# Patient Record
Sex: Female | Born: 1935 | Race: White | Hispanic: No | Marital: Married | Smoking: Former smoker
Health system: Southern US, Community
[De-identification: ages and names within clinical notes are randomized; demographics above are authoritative.]

## PROBLEM LIST (undated history)

## (undated) DIAGNOSIS — F411 Generalized anxiety disorder: Secondary | ICD-10-CM

## (undated) DIAGNOSIS — L538 Other specified erythematous conditions: Secondary | ICD-10-CM

## (undated) DIAGNOSIS — M1990 Unspecified osteoarthritis, unspecified site: Secondary | ICD-10-CM

## (undated) DIAGNOSIS — K5732 Diverticulitis of large intestine without perforation or abscess without bleeding: Secondary | ICD-10-CM

## (undated) DIAGNOSIS — J310 Chronic rhinitis: Secondary | ICD-10-CM

## (undated) DIAGNOSIS — Z8719 Personal history of other diseases of the digestive system: Secondary | ICD-10-CM

## (undated) DIAGNOSIS — E782 Mixed hyperlipidemia: Secondary | ICD-10-CM

## (undated) DIAGNOSIS — E669 Obesity, unspecified: Secondary | ICD-10-CM

## (undated) DIAGNOSIS — Z8489 Family history of other specified conditions: Secondary | ICD-10-CM

## (undated) DIAGNOSIS — L8991 Pressure ulcer of unspecified site, stage 1: Secondary | ICD-10-CM

## (undated) DIAGNOSIS — K219 Gastro-esophageal reflux disease without esophagitis: Secondary | ICD-10-CM

## (undated) DIAGNOSIS — L98499 Non-pressure chronic ulcer of skin of other sites with unspecified severity: Secondary | ICD-10-CM

## (undated) DIAGNOSIS — D126 Benign neoplasm of colon, unspecified: Secondary | ICD-10-CM

## (undated) DIAGNOSIS — F329 Major depressive disorder, single episode, unspecified: Secondary | ICD-10-CM

## (undated) DIAGNOSIS — G309 Alzheimer's disease, unspecified: Principal | ICD-10-CM

## (undated) DIAGNOSIS — F0390 Unspecified dementia without behavioral disturbance: Secondary | ICD-10-CM

## (undated) DIAGNOSIS — Z87440 Personal history of urinary (tract) infections: Secondary | ICD-10-CM

## (undated) DIAGNOSIS — J40 Bronchitis, not specified as acute or chronic: Secondary | ICD-10-CM

## (undated) DIAGNOSIS — F0280 Dementia in other diseases classified elsewhere without behavioral disturbance: Principal | ICD-10-CM

## (undated) DIAGNOSIS — F3289 Other specified depressive episodes: Secondary | ICD-10-CM

## (undated) DIAGNOSIS — I10 Essential (primary) hypertension: Secondary | ICD-10-CM

## (undated) HISTORY — DX: Unspecified osteoarthritis, unspecified site: M19.90

## (undated) HISTORY — DX: Unspecified dementia, unspecified severity, without behavioral disturbance, psychotic disturbance, mood disturbance, and anxiety: F03.90

## (undated) HISTORY — DX: Other specified erythematous conditions: L53.8

## (undated) HISTORY — DX: Diverticulitis of large intestine without perforation or abscess without bleeding: K57.32

## (undated) HISTORY — DX: Chronic rhinitis: J31.0

## (undated) HISTORY — PX: TOTAL HIP ARTHROPLASTY: SHX124

## (undated) HISTORY — DX: Obesity, unspecified: E66.9

## (undated) HISTORY — DX: Gastro-esophageal reflux disease without esophagitis: K21.9

## (undated) HISTORY — DX: Major depressive disorder, single episode, unspecified: F32.9

## (undated) HISTORY — DX: Mixed hyperlipidemia: E78.2

## (undated) HISTORY — DX: Other specified depressive episodes: F32.89

## (undated) HISTORY — DX: Generalized anxiety disorder: F41.1

## (undated) HISTORY — PX: ROTATOR CUFF REPAIR: SHX139

## (undated) HISTORY — DX: Benign neoplasm of colon, unspecified: D12.6

## (undated) HISTORY — PX: CHOLECYSTECTOMY: SHX55

## (undated) HISTORY — DX: Alzheimer's disease, unspecified: G30.9

## (undated) HISTORY — PX: VESICOVAGINAL FISTULA CLOSURE W/ TAH: SUR271

## (undated) HISTORY — DX: Essential (primary) hypertension: I10

## (undated) HISTORY — DX: Dementia in other diseases classified elsewhere without behavioral disturbance: F02.80

## (undated) HISTORY — DX: Pressure ulcer of unspecified site, stage 1: L89.91

---

## 1978-07-16 HISTORY — PX: ABDOMINAL HYSTERECTOMY: SHX81

## 1998-03-18 ENCOUNTER — Encounter: Payer: Self-pay | Admitting: Orthopedic Surgery

## 1998-03-18 ENCOUNTER — Ambulatory Visit (HOSPITAL_COMMUNITY): Admission: RE | Admit: 1998-03-18 | Discharge: 1998-03-18 | Payer: Self-pay | Admitting: Orthopedic Surgery

## 1998-05-19 ENCOUNTER — Ambulatory Visit (HOSPITAL_BASED_OUTPATIENT_CLINIC_OR_DEPARTMENT_OTHER): Admission: RE | Admit: 1998-05-19 | Discharge: 1998-05-19 | Payer: Self-pay | Admitting: Orthopedic Surgery

## 1998-05-30 ENCOUNTER — Encounter: Admission: RE | Admit: 1998-05-30 | Discharge: 1998-08-28 | Payer: Self-pay | Admitting: Orthopedic Surgery

## 1998-08-29 ENCOUNTER — Encounter: Admission: RE | Admit: 1998-08-29 | Discharge: 1998-11-27 | Payer: Self-pay

## 1998-12-07 ENCOUNTER — Encounter: Admission: RE | Admit: 1998-12-07 | Discharge: 1999-03-07 | Payer: Self-pay | Admitting: Internal Medicine

## 1998-12-28 ENCOUNTER — Encounter: Admission: RE | Admit: 1998-12-28 | Discharge: 1999-01-27 | Payer: Self-pay | Admitting: Orthopedic Surgery

## 1999-05-29 ENCOUNTER — Encounter: Payer: Self-pay | Admitting: Orthopedic Surgery

## 1999-05-29 ENCOUNTER — Ambulatory Visit (HOSPITAL_COMMUNITY): Admission: RE | Admit: 1999-05-29 | Discharge: 1999-05-29 | Payer: Self-pay | Admitting: Orthopedic Surgery

## 1999-06-12 ENCOUNTER — Ambulatory Visit (HOSPITAL_COMMUNITY): Admission: RE | Admit: 1999-06-12 | Discharge: 1999-06-12 | Payer: Self-pay | Admitting: Orthopedic Surgery

## 1999-06-12 ENCOUNTER — Encounter: Payer: Self-pay | Admitting: Orthopedic Surgery

## 1999-06-26 ENCOUNTER — Encounter: Payer: Self-pay | Admitting: Orthopedic Surgery

## 1999-06-26 ENCOUNTER — Ambulatory Visit (HOSPITAL_COMMUNITY): Admission: RE | Admit: 1999-06-26 | Discharge: 1999-06-26 | Payer: Self-pay | Admitting: Orthopedic Surgery

## 1999-08-31 ENCOUNTER — Encounter: Admission: RE | Admit: 1999-08-31 | Discharge: 1999-11-14 | Payer: Self-pay | Admitting: Neurosurgery

## 1999-10-12 ENCOUNTER — Ambulatory Visit (HOSPITAL_COMMUNITY): Admission: RE | Admit: 1999-10-12 | Discharge: 1999-10-12 | Payer: Self-pay | Admitting: Orthopedic Surgery

## 1999-10-12 ENCOUNTER — Encounter: Payer: Self-pay | Admitting: Orthopedic Surgery

## 1999-12-01 ENCOUNTER — Encounter: Payer: Self-pay | Admitting: Neurosurgery

## 1999-12-01 ENCOUNTER — Ambulatory Visit (HOSPITAL_COMMUNITY): Admission: RE | Admit: 1999-12-01 | Discharge: 1999-12-01 | Payer: Self-pay | Admitting: Neurosurgery

## 1999-12-15 ENCOUNTER — Encounter: Admission: RE | Admit: 1999-12-15 | Discharge: 2000-01-29 | Payer: Self-pay | Admitting: Anesthesiology

## 2000-05-15 ENCOUNTER — Encounter: Payer: Self-pay | Admitting: Orthopedic Surgery

## 2000-05-20 ENCOUNTER — Encounter: Payer: Self-pay | Admitting: Orthopedic Surgery

## 2000-05-20 ENCOUNTER — Inpatient Hospital Stay (HOSPITAL_COMMUNITY): Admission: RE | Admit: 2000-05-20 | Discharge: 2000-05-23 | Payer: Self-pay | Admitting: Orthopedic Surgery

## 2000-05-23 ENCOUNTER — Inpatient Hospital Stay (HOSPITAL_COMMUNITY)
Admission: RE | Admit: 2000-05-23 | Discharge: 2000-05-29 | Payer: Self-pay | Admitting: Physical Medicine & Rehabilitation

## 2000-08-22 ENCOUNTER — Encounter: Admission: RE | Admit: 2000-08-22 | Discharge: 2000-11-20 | Payer: Self-pay | Admitting: Family Medicine

## 2000-09-18 ENCOUNTER — Encounter: Admission: RE | Admit: 2000-09-18 | Discharge: 2000-09-18 | Payer: Self-pay | Admitting: Obstetrics and Gynecology

## 2000-09-18 ENCOUNTER — Encounter: Payer: Self-pay | Admitting: Obstetrics and Gynecology

## 2000-09-26 ENCOUNTER — Encounter: Admission: RE | Admit: 2000-09-26 | Discharge: 2000-12-25 | Payer: Self-pay | Admitting: Anesthesiology

## 2000-10-17 ENCOUNTER — Encounter: Admission: RE | Admit: 2000-10-17 | Discharge: 2000-10-17 | Payer: Self-pay | Admitting: Internal Medicine

## 2000-11-12 ENCOUNTER — Encounter (INDEPENDENT_AMBULATORY_CARE_PROVIDER_SITE_OTHER): Payer: Self-pay

## 2000-11-12 ENCOUNTER — Other Ambulatory Visit: Admission: RE | Admit: 2000-11-12 | Discharge: 2000-11-12 | Payer: Self-pay | Admitting: Gastroenterology

## 2000-11-21 ENCOUNTER — Encounter: Admission: RE | Admit: 2000-11-21 | Discharge: 2001-02-19 | Payer: Self-pay | Admitting: Family Medicine

## 2001-02-28 ENCOUNTER — Encounter: Admission: RE | Admit: 2001-02-28 | Discharge: 2001-05-29 | Payer: Self-pay | Admitting: Internal Medicine

## 2001-05-20 ENCOUNTER — Encounter: Payer: Self-pay | Admitting: Orthopedic Surgery

## 2001-05-22 ENCOUNTER — Inpatient Hospital Stay (HOSPITAL_COMMUNITY): Admission: RE | Admit: 2001-05-22 | Discharge: 2001-05-26 | Payer: Self-pay | Admitting: Orthopedic Surgery

## 2001-05-22 ENCOUNTER — Encounter: Payer: Self-pay | Admitting: Orthopedic Surgery

## 2001-06-26 ENCOUNTER — Encounter: Admission: RE | Admit: 2001-06-26 | Discharge: 2001-09-24 | Payer: Self-pay | Admitting: Orthopedic Surgery

## 2001-09-29 ENCOUNTER — Encounter: Admission: RE | Admit: 2001-09-29 | Discharge: 2001-10-24 | Payer: Self-pay | Admitting: Orthopedic Surgery

## 2002-02-11 ENCOUNTER — Encounter: Payer: Self-pay | Admitting: Obstetrics and Gynecology

## 2002-02-11 ENCOUNTER — Encounter: Admission: RE | Admit: 2002-02-11 | Discharge: 2002-02-11 | Payer: Self-pay | Admitting: Obstetrics and Gynecology

## 2002-08-21 ENCOUNTER — Ambulatory Visit (HOSPITAL_COMMUNITY): Admission: RE | Admit: 2002-08-21 | Discharge: 2002-08-21 | Payer: Self-pay | Admitting: Pulmonary Disease

## 2002-08-21 ENCOUNTER — Encounter: Payer: Self-pay | Admitting: Pulmonary Disease

## 2003-05-05 ENCOUNTER — Ambulatory Visit (HOSPITAL_COMMUNITY): Admission: RE | Admit: 2003-05-05 | Discharge: 2003-05-05 | Payer: Self-pay | Admitting: Gastroenterology

## 2003-05-05 ENCOUNTER — Encounter: Payer: Self-pay | Admitting: Gastroenterology

## 2003-10-09 ENCOUNTER — Emergency Department (HOSPITAL_COMMUNITY): Admission: EM | Admit: 2003-10-09 | Discharge: 2003-10-10 | Payer: Self-pay | Admitting: Emergency Medicine

## 2003-12-14 ENCOUNTER — Observation Stay (HOSPITAL_COMMUNITY): Admission: AD | Admit: 2003-12-14 | Discharge: 2003-12-17 | Payer: Self-pay | Admitting: Internal Medicine

## 2004-01-14 ENCOUNTER — Encounter: Payer: Self-pay | Admitting: Internal Medicine

## 2004-01-14 LAB — CONVERTED CEMR LAB

## 2004-02-01 ENCOUNTER — Ambulatory Visit (HOSPITAL_COMMUNITY): Admission: RE | Admit: 2004-02-01 | Discharge: 2004-02-01 | Payer: Self-pay | Admitting: Internal Medicine

## 2004-03-21 ENCOUNTER — Encounter: Payer: Self-pay | Admitting: Gastroenterology

## 2004-03-24 ENCOUNTER — Ambulatory Visit (HOSPITAL_COMMUNITY): Admission: RE | Admit: 2004-03-24 | Discharge: 2004-03-24 | Payer: Self-pay | Admitting: Gastroenterology

## 2004-06-05 ENCOUNTER — Ambulatory Visit: Payer: Self-pay | Admitting: Adult Health

## 2004-06-21 ENCOUNTER — Ambulatory Visit: Payer: Self-pay | Admitting: Internal Medicine

## 2004-06-30 ENCOUNTER — Ambulatory Visit: Payer: Self-pay | Admitting: Internal Medicine

## 2004-07-18 ENCOUNTER — Ambulatory Visit: Payer: Self-pay | Admitting: Internal Medicine

## 2004-08-01 ENCOUNTER — Ambulatory Visit: Payer: Self-pay | Admitting: Adult Health

## 2004-08-31 ENCOUNTER — Ambulatory Visit: Payer: Self-pay | Admitting: Internal Medicine

## 2004-10-21 ENCOUNTER — Emergency Department (HOSPITAL_COMMUNITY): Admission: EM | Admit: 2004-10-21 | Discharge: 2004-10-21 | Payer: Self-pay | Admitting: Emergency Medicine

## 2004-10-26 ENCOUNTER — Ambulatory Visit: Payer: Self-pay | Admitting: Pulmonary Disease

## 2004-11-09 ENCOUNTER — Ambulatory Visit: Payer: Self-pay | Admitting: Internal Medicine

## 2004-12-07 ENCOUNTER — Ambulatory Visit: Payer: Self-pay | Admitting: Internal Medicine

## 2005-01-01 ENCOUNTER — Ambulatory Visit: Payer: Self-pay | Admitting: Internal Medicine

## 2005-01-18 ENCOUNTER — Ambulatory Visit: Payer: Self-pay | Admitting: Internal Medicine

## 2005-03-15 ENCOUNTER — Ambulatory Visit: Payer: Self-pay | Admitting: Internal Medicine

## 2005-06-13 ENCOUNTER — Ambulatory Visit: Payer: Self-pay | Admitting: Internal Medicine

## 2005-08-13 ENCOUNTER — Ambulatory Visit: Payer: Self-pay | Admitting: Internal Medicine

## 2005-10-18 ENCOUNTER — Ambulatory Visit: Payer: Self-pay | Admitting: Pulmonary Disease

## 2005-11-19 ENCOUNTER — Ambulatory Visit: Payer: Self-pay | Admitting: Internal Medicine

## 2006-04-11 ENCOUNTER — Ambulatory Visit: Payer: Self-pay | Admitting: Internal Medicine

## 2006-04-12 ENCOUNTER — Ambulatory Visit: Payer: Self-pay | Admitting: Internal Medicine

## 2006-04-15 ENCOUNTER — Ambulatory Visit: Payer: Self-pay | Admitting: Pulmonary Disease

## 2006-05-31 ENCOUNTER — Ambulatory Visit: Payer: Self-pay | Admitting: Emergency Medicine

## 2006-07-15 ENCOUNTER — Emergency Department (HOSPITAL_COMMUNITY): Admission: EM | Admit: 2006-07-15 | Discharge: 2006-07-15 | Payer: Self-pay | Admitting: Emergency Medicine

## 2006-09-05 ENCOUNTER — Ambulatory Visit: Payer: Self-pay | Admitting: Internal Medicine

## 2007-03-12 ENCOUNTER — Ambulatory Visit: Payer: Self-pay | Admitting: Internal Medicine

## 2007-05-06 ENCOUNTER — Ambulatory Visit (HOSPITAL_BASED_OUTPATIENT_CLINIC_OR_DEPARTMENT_OTHER): Admission: RE | Admit: 2007-05-06 | Discharge: 2007-05-06 | Payer: Self-pay | Admitting: Urology

## 2007-05-14 ENCOUNTER — Ambulatory Visit: Payer: Self-pay | Admitting: Internal Medicine

## 2007-06-03 ENCOUNTER — Encounter: Payer: Self-pay | Admitting: Internal Medicine

## 2007-06-03 DIAGNOSIS — F329 Major depressive disorder, single episode, unspecified: Secondary | ICD-10-CM

## 2007-06-03 DIAGNOSIS — D126 Benign neoplasm of colon, unspecified: Secondary | ICD-10-CM

## 2007-06-03 DIAGNOSIS — Z8719 Personal history of other diseases of the digestive system: Secondary | ICD-10-CM

## 2007-06-03 DIAGNOSIS — J310 Chronic rhinitis: Secondary | ICD-10-CM

## 2007-06-03 DIAGNOSIS — E782 Mixed hyperlipidemia: Secondary | ICD-10-CM

## 2007-06-03 DIAGNOSIS — I10 Essential (primary) hypertension: Secondary | ICD-10-CM | POA: Insufficient documentation

## 2007-06-03 DIAGNOSIS — F068 Other specified mental disorders due to known physiological condition: Secondary | ICD-10-CM

## 2007-06-03 DIAGNOSIS — J209 Acute bronchitis, unspecified: Secondary | ICD-10-CM | POA: Insufficient documentation

## 2007-06-03 DIAGNOSIS — F411 Generalized anxiety disorder: Secondary | ICD-10-CM

## 2007-06-03 DIAGNOSIS — M1990 Unspecified osteoarthritis, unspecified site: Secondary | ICD-10-CM | POA: Insufficient documentation

## 2007-06-03 DIAGNOSIS — K219 Gastro-esophageal reflux disease without esophagitis: Secondary | ICD-10-CM | POA: Insufficient documentation

## 2007-06-03 DIAGNOSIS — E669 Obesity, unspecified: Secondary | ICD-10-CM

## 2007-06-23 ENCOUNTER — Ambulatory Visit: Payer: Self-pay | Admitting: Pulmonary Disease

## 2007-06-30 LAB — CONVERTED CEMR LAB
ALT: 14 units/L (ref 0–35)
Albumin: 3.4 g/dL — ABNORMAL LOW (ref 3.5–5.2)
Alkaline Phosphatase: 85 units/L (ref 39–117)
Basophils Absolute: 0 10*3/uL (ref 0.0–0.1)
Calcium: 9.5 mg/dL (ref 8.4–10.5)
Chloride: 97 meq/L (ref 96–112)
GFR calc non Af Amer: 58 mL/min
HCT: 40.8 % (ref 36.0–46.0)
MCHC: 34.5 g/dL (ref 30.0–36.0)
Monocytes Absolute: 1.1 10*3/uL — ABNORMAL HIGH (ref 0.2–0.7)
Neutro Abs: 6.9 10*3/uL (ref 1.4–7.7)
RBC: 4.39 M/uL (ref 3.87–5.11)
RDW: 15.7 % — ABNORMAL HIGH (ref 11.5–14.6)
TSH: 1.58 microintl units/mL (ref 0.35–5.50)
Total Bilirubin: 0.6 mg/dL (ref 0.3–1.2)
WBC: 10.3 10*3/uL (ref 4.5–10.5)

## 2008-01-21 ENCOUNTER — Ambulatory Visit: Payer: Self-pay | Admitting: Internal Medicine

## 2008-01-21 DIAGNOSIS — R05 Cough: Secondary | ICD-10-CM

## 2008-01-22 ENCOUNTER — Ambulatory Visit: Payer: Self-pay | Admitting: Gastroenterology

## 2008-01-22 DIAGNOSIS — K5732 Diverticulitis of large intestine without perforation or abscess without bleeding: Secondary | ICD-10-CM

## 2008-01-22 LAB — CONVERTED CEMR LAB
CO2: 31 meq/L (ref 19–32)
Calcium: 9.4 mg/dL (ref 8.4–10.5)
Chloride: 97 meq/L (ref 96–112)
Creatinine, Ser: 1.1 mg/dL (ref 0.4–1.2)
GFR calc non Af Amer: 51 mL/min
Glucose, Bld: 107 mg/dL — ABNORMAL HIGH (ref 70–99)

## 2008-01-27 ENCOUNTER — Ambulatory Visit: Payer: Self-pay | Admitting: Cardiology

## 2008-01-29 ENCOUNTER — Ambulatory Visit: Payer: Self-pay | Admitting: Gastroenterology

## 2008-01-29 LAB — CONVERTED CEMR LAB: Total Protein: 7.6 g/dL (ref 6.0–8.3)

## 2008-02-25 ENCOUNTER — Ambulatory Visit: Payer: Self-pay | Admitting: Gastroenterology

## 2008-04-05 ENCOUNTER — Ambulatory Visit: Payer: Self-pay | Admitting: Gastroenterology

## 2008-05-06 ENCOUNTER — Ambulatory Visit: Payer: Self-pay | Admitting: Internal Medicine

## 2008-06-21 ENCOUNTER — Ambulatory Visit: Payer: Self-pay | Admitting: Internal Medicine

## 2008-07-20 ENCOUNTER — Encounter: Payer: Self-pay | Admitting: Internal Medicine

## 2008-07-20 ENCOUNTER — Ambulatory Visit: Payer: Self-pay | Admitting: Internal Medicine

## 2008-07-29 ENCOUNTER — Telehealth: Payer: Self-pay | Admitting: Internal Medicine

## 2008-08-02 ENCOUNTER — Ambulatory Visit: Payer: Self-pay | Admitting: Internal Medicine

## 2008-08-02 DIAGNOSIS — R519 Headache, unspecified: Secondary | ICD-10-CM | POA: Insufficient documentation

## 2008-08-02 DIAGNOSIS — R51 Headache: Secondary | ICD-10-CM

## 2008-08-05 LAB — CONVERTED CEMR LAB
ALT: 16 units/L (ref 0–35)
Alkaline Phosphatase: 75 units/L (ref 39–117)
Basophils Relative: 0 % (ref 0.0–3.0)
Bilirubin Urine: NEGATIVE
Eosinophils Absolute: 0.4 10*3/uL (ref 0.0–0.7)
Glucose, Bld: 124 mg/dL — ABNORMAL HIGH (ref 70–99)
Hemoglobin, Urine: NEGATIVE
Hemoglobin: 15.4 g/dL — ABNORMAL HIGH (ref 12.0–15.0)
Ketones, ur: NEGATIVE mg/dL
Leukocytes, UA: NEGATIVE
MCHC: 35.9 g/dL (ref 30.0–36.0)
Neutrophils Relative %: 79.2 % — ABNORMAL HIGH (ref 43.0–77.0)
Nitrite: POSITIVE — AB
Potassium: 3.3 meq/L — ABNORMAL LOW (ref 3.5–5.1)
Sodium: 142 meq/L (ref 135–145)
Specific Gravity, Urine: 1.02 (ref 1.000–1.03)
Total Bilirubin: 0.7 mg/dL (ref 0.3–1.2)
Total Protein, Urine: NEGATIVE mg/dL

## 2008-08-10 ENCOUNTER — Telehealth (INDEPENDENT_AMBULATORY_CARE_PROVIDER_SITE_OTHER): Payer: Self-pay | Admitting: *Deleted

## 2008-08-19 ENCOUNTER — Ambulatory Visit: Payer: Self-pay | Admitting: Internal Medicine

## 2008-08-30 ENCOUNTER — Ambulatory Visit: Payer: Self-pay | Admitting: Internal Medicine

## 2008-08-30 DIAGNOSIS — R1084 Generalized abdominal pain: Secondary | ICD-10-CM

## 2008-08-31 ENCOUNTER — Encounter: Payer: Self-pay | Admitting: Adult Health

## 2008-08-31 LAB — CONVERTED CEMR LAB
AST: 23 units/L (ref 0–37)
BUN: 15 mg/dL (ref 6–23)
Bilirubin, Direct: 0.2 mg/dL (ref 0.0–0.3)
Creatinine, Ser: 1.1 mg/dL (ref 0.4–1.2)
Eosinophils Relative: 2.4 % (ref 0.0–5.0)
HCT: 42.2 % (ref 36.0–46.0)
Hemoglobin: 14.2 g/dL (ref 12.0–15.0)
Lymphocytes Relative: 13.3 % (ref 12.0–46.0)
MCHC: 33.6 g/dL (ref 30.0–36.0)
Mucus, UA: NEGATIVE
Neutrophils Relative %: 69.4 % (ref 43.0–77.0)
RDW: 14.2 % (ref 11.5–14.6)
Total Bilirubin: 0.6 mg/dL (ref 0.3–1.2)
Total Protein, Urine: NEGATIVE mg/dL
Urine Glucose: NEGATIVE mg/dL
Urobilinogen, UA: 0.2 (ref 0.0–1.0)
WBC: 15.2 10*3/uL — ABNORMAL HIGH (ref 4.5–10.5)
pH: 7 (ref 5.0–8.0)

## 2008-09-23 ENCOUNTER — Ambulatory Visit: Payer: Self-pay | Admitting: Internal Medicine

## 2008-09-23 DIAGNOSIS — J069 Acute upper respiratory infection, unspecified: Secondary | ICD-10-CM | POA: Insufficient documentation

## 2009-01-03 ENCOUNTER — Encounter: Payer: Self-pay | Admitting: Internal Medicine

## 2009-01-21 ENCOUNTER — Ambulatory Visit: Payer: Self-pay | Admitting: Internal Medicine

## 2009-01-21 ENCOUNTER — Encounter: Payer: Self-pay | Admitting: Adult Health

## 2009-01-24 LAB — CONVERTED CEMR LAB
Basophils Relative: 0 % (ref 0.0–3.0)
Bilirubin Urine: NEGATIVE
CO2: 29 meq/L (ref 19–32)
Eosinophils Relative: 2.6 % (ref 0.0–5.0)
GFR calc non Af Amer: 51.28 mL/min (ref >60)
Hemoglobin, Urine: NEGATIVE
Ketones, ur: NEGATIVE mg/dL
Lymphocytes Relative: 13.6 % (ref 12.0–46.0)
Monocytes Absolute: 0.3 10*3/uL (ref 0.1–1.0)
Monocytes Relative: 3 % (ref 3.0–12.0)
Neutro Abs: 9.2 10*3/uL — ABNORMAL HIGH (ref 1.4–7.7)
Potassium: 3.9 meq/L (ref 3.5–5.1)
RBC: 4.42 M/uL (ref 3.87–5.11)
RDW: 14.3 % (ref 11.5–14.6)
Sodium: 139 meq/L (ref 135–145)
Total Protein, Urine: NEGATIVE mg/dL
Urine Glucose: NEGATIVE mg/dL
Urobilinogen, UA: 0.2 (ref 0.0–1.0)
WBC: 11.3 10*3/uL — ABNORMAL HIGH (ref 4.5–10.5)
pH: 6.5 (ref 5.0–8.0)

## 2009-02-01 ENCOUNTER — Telehealth: Payer: Self-pay | Admitting: Internal Medicine

## 2009-02-02 ENCOUNTER — Telehealth (INDEPENDENT_AMBULATORY_CARE_PROVIDER_SITE_OTHER): Payer: Self-pay | Admitting: *Deleted

## 2009-03-04 ENCOUNTER — Encounter: Payer: Self-pay | Admitting: Internal Medicine

## 2009-03-24 ENCOUNTER — Encounter (INDEPENDENT_AMBULATORY_CARE_PROVIDER_SITE_OTHER): Payer: Self-pay | Admitting: *Deleted

## 2009-06-27 ENCOUNTER — Ambulatory Visit: Payer: Self-pay | Admitting: Emergency Medicine

## 2009-07-24 ENCOUNTER — Encounter: Admission: RE | Admit: 2009-07-24 | Discharge: 2009-07-24 | Payer: Self-pay | Admitting: Sports Medicine

## 2009-08-02 ENCOUNTER — Encounter: Payer: Self-pay | Admitting: Internal Medicine

## 2009-08-16 ENCOUNTER — Ambulatory Visit: Payer: Self-pay | Admitting: Internal Medicine

## 2009-09-06 ENCOUNTER — Encounter: Admission: RE | Admit: 2009-09-06 | Discharge: 2009-09-06 | Payer: Self-pay | Admitting: Orthopedic Surgery

## 2009-09-07 ENCOUNTER — Ambulatory Visit (HOSPITAL_COMMUNITY): Admission: RE | Admit: 2009-09-07 | Discharge: 2009-09-07 | Payer: Self-pay | Admitting: Orthopedic Surgery

## 2009-09-15 ENCOUNTER — Telehealth: Payer: Self-pay | Admitting: Gastroenterology

## 2009-10-06 ENCOUNTER — Ambulatory Visit (HOSPITAL_BASED_OUTPATIENT_CLINIC_OR_DEPARTMENT_OTHER): Admission: RE | Admit: 2009-10-06 | Discharge: 2009-10-07 | Payer: Self-pay | Admitting: Orthopedic Surgery

## 2009-10-17 ENCOUNTER — Telehealth: Payer: Self-pay | Admitting: Adult Health

## 2009-10-17 ENCOUNTER — Telehealth (INDEPENDENT_AMBULATORY_CARE_PROVIDER_SITE_OTHER): Payer: Self-pay | Admitting: *Deleted

## 2009-10-24 ENCOUNTER — Ambulatory Visit: Payer: Self-pay | Admitting: Internal Medicine

## 2009-10-24 LAB — CONVERTED CEMR LAB
ALT: 14 units/L (ref 0–35)
AST: 28 units/L (ref 0–37)
Basophils Absolute: 0.1 10*3/uL (ref 0.0–0.1)
Bilirubin, Direct: 0.1 mg/dL (ref 0.0–0.3)
Calcium: 9.4 mg/dL (ref 8.4–10.5)
Chloride: 99 meq/L (ref 96–112)
Creatinine, Ser: 1.2 mg/dL (ref 0.4–1.2)
Glucose, Bld: 138 mg/dL — ABNORMAL HIGH (ref 70–99)
HCT: 43.7 % (ref 36.0–46.0)
Hemoglobin: 15.3 g/dL — ABNORMAL HIGH (ref 12.0–15.0)
MCHC: 35 g/dL (ref 30.0–36.0)
Monocytes Relative: 5.7 % (ref 3.0–12.0)
Platelets: 293 10*3/uL (ref 150.0–400.0)
RDW: 15.7 % — ABNORMAL HIGH (ref 11.5–14.6)
TSH: 1.6 microintl units/mL (ref 0.35–5.50)
Total Bilirubin: 0.4 mg/dL (ref 0.3–1.2)
Total Protein: 8.2 g/dL (ref 6.0–8.3)
WBC: 13.4 10*3/uL — ABNORMAL HIGH (ref 4.5–10.5)

## 2009-10-26 ENCOUNTER — Telehealth: Payer: Self-pay | Admitting: Internal Medicine

## 2009-10-31 ENCOUNTER — Ambulatory Visit: Payer: Self-pay | Admitting: Internal Medicine

## 2009-11-01 ENCOUNTER — Telehealth (INDEPENDENT_AMBULATORY_CARE_PROVIDER_SITE_OTHER): Payer: Self-pay | Admitting: *Deleted

## 2009-11-01 ENCOUNTER — Encounter: Payer: Self-pay | Admitting: Adult Health

## 2009-11-01 LAB — CONVERTED CEMR LAB: Specific Gravity, Urine: 1.015 (ref 1.000–1.030)

## 2009-11-07 ENCOUNTER — Telehealth: Payer: Self-pay | Admitting: Adult Health

## 2009-11-15 ENCOUNTER — Encounter: Payer: Self-pay | Admitting: Internal Medicine

## 2010-01-04 ENCOUNTER — Ambulatory Visit: Payer: Self-pay | Admitting: Internal Medicine

## 2010-01-04 DIAGNOSIS — L89151 Pressure ulcer of sacral region, stage 1: Secondary | ICD-10-CM

## 2010-01-05 ENCOUNTER — Encounter: Payer: Self-pay | Admitting: Internal Medicine

## 2010-01-05 LAB — CONVERTED CEMR LAB
BUN: 10 mg/dL (ref 6–23)
Bilirubin Urine: NEGATIVE
CO2: 30 meq/L (ref 19–32)
Calcium: 9 mg/dL (ref 8.4–10.5)
Eosinophils Absolute: 0.2 10*3/uL (ref 0.0–0.7)
GFR calc non Af Amer: 52.24 mL/min (ref >60)
Glucose, Bld: 132 mg/dL — ABNORMAL HIGH (ref 70–99)
Lymphocytes Relative: 17.5 % (ref 12.0–46.0)
Lymphs Abs: 2 10*3/uL (ref 0.7–4.0)
MCHC: 34.3 g/dL (ref 30.0–36.0)
MCV: 100.4 fL — ABNORMAL HIGH (ref 78.0–100.0)
Monocytes Absolute: 0.8 10*3/uL (ref 0.1–1.0)
Monocytes Relative: 7.5 % (ref 3.0–12.0)
Neutro Abs: 8.1 10*3/uL — ABNORMAL HIGH (ref 1.4–7.7)
Neutrophils Relative %: 72.8 % (ref 43.0–77.0)
Nitrite: NEGATIVE
Potassium: 2.9 meq/L — ABNORMAL LOW (ref 3.5–5.1)
Sodium: 143 meq/L (ref 135–145)
Specific Gravity, Urine: 1.02 (ref 1.000–1.030)
Total Protein, Urine: NEGATIVE mg/dL
Urine Glucose: NEGATIVE mg/dL
pH: 6 (ref 5.0–8.0)

## 2010-01-11 ENCOUNTER — Telehealth (INDEPENDENT_AMBULATORY_CARE_PROVIDER_SITE_OTHER): Payer: Self-pay | Admitting: *Deleted

## 2010-01-17 ENCOUNTER — Telehealth (INDEPENDENT_AMBULATORY_CARE_PROVIDER_SITE_OTHER): Payer: Self-pay | Admitting: *Deleted

## 2010-01-23 ENCOUNTER — Encounter: Payer: Self-pay | Admitting: Internal Medicine

## 2010-01-24 ENCOUNTER — Encounter: Payer: Self-pay | Admitting: Internal Medicine

## 2010-01-31 ENCOUNTER — Encounter: Payer: Self-pay | Admitting: Internal Medicine

## 2010-02-01 ENCOUNTER — Telehealth: Payer: Self-pay | Admitting: Internal Medicine

## 2010-02-07 ENCOUNTER — Ambulatory Visit: Payer: Self-pay | Admitting: Internal Medicine

## 2010-02-09 ENCOUNTER — Telehealth (INDEPENDENT_AMBULATORY_CARE_PROVIDER_SITE_OTHER): Payer: Self-pay | Admitting: *Deleted

## 2010-02-09 LAB — CONVERTED CEMR LAB
CO2: 31 meq/L (ref 19–32)
Calcium: 8.6 mg/dL (ref 8.4–10.5)
Chloride: 105 meq/L (ref 96–112)
Creatinine, Ser: 0.9 mg/dL (ref 0.4–1.2)
Potassium: 3.6 meq/L (ref 3.5–5.1)
Sodium: 142 meq/L (ref 135–145)

## 2010-02-10 ENCOUNTER — Telehealth (INDEPENDENT_AMBULATORY_CARE_PROVIDER_SITE_OTHER): Payer: Self-pay | Admitting: *Deleted

## 2010-02-13 ENCOUNTER — Encounter: Payer: Self-pay | Admitting: Internal Medicine

## 2010-02-23 ENCOUNTER — Telehealth: Payer: Self-pay | Admitting: Internal Medicine

## 2010-03-01 ENCOUNTER — Encounter: Payer: Self-pay | Admitting: Internal Medicine

## 2010-03-06 ENCOUNTER — Telehealth (INDEPENDENT_AMBULATORY_CARE_PROVIDER_SITE_OTHER): Payer: Self-pay | Admitting: *Deleted

## 2010-03-09 ENCOUNTER — Telehealth (INDEPENDENT_AMBULATORY_CARE_PROVIDER_SITE_OTHER): Payer: Self-pay | Admitting: *Deleted

## 2010-03-10 ENCOUNTER — Ambulatory Visit: Payer: Self-pay | Admitting: Internal Medicine

## 2010-03-14 DIAGNOSIS — L538 Other specified erythematous conditions: Secondary | ICD-10-CM | POA: Insufficient documentation

## 2010-05-01 ENCOUNTER — Encounter: Payer: Self-pay | Admitting: Internal Medicine

## 2010-05-01 ENCOUNTER — Emergency Department (HOSPITAL_BASED_OUTPATIENT_CLINIC_OR_DEPARTMENT_OTHER)
Admission: EM | Admit: 2010-05-01 | Discharge: 2010-05-02 | Payer: Self-pay | Source: Home / Self Care | Admitting: Emergency Medicine

## 2010-05-01 ENCOUNTER — Ambulatory Visit: Payer: Self-pay | Admitting: Diagnostic Radiology

## 2010-05-02 ENCOUNTER — Ambulatory Visit: Payer: Self-pay | Admitting: Internal Medicine

## 2010-05-15 ENCOUNTER — Telehealth: Payer: Self-pay | Admitting: Internal Medicine

## 2010-05-22 ENCOUNTER — Ambulatory Visit: Payer: Self-pay | Admitting: Gastroenterology

## 2010-05-22 DIAGNOSIS — R1310 Dysphagia, unspecified: Secondary | ICD-10-CM | POA: Insufficient documentation

## 2010-05-25 ENCOUNTER — Telehealth: Payer: Self-pay | Admitting: Internal Medicine

## 2010-06-19 ENCOUNTER — Ambulatory Visit: Payer: Self-pay | Admitting: Gastroenterology

## 2010-06-26 ENCOUNTER — Ambulatory Visit: Payer: Self-pay | Admitting: Internal Medicine

## 2010-06-27 LAB — CONVERTED CEMR LAB
BUN: 13 mg/dL (ref 6–23)
Basophils Relative: 0.1 % (ref 0.0–3.0)
CO2: 28 meq/L (ref 19–32)
Chloride: 104 meq/L (ref 96–112)
Creatinine, Ser: 0.9 mg/dL (ref 0.4–1.2)
Eosinophils Relative: 3.6 % (ref 0.0–5.0)
Glucose, Bld: 108 mg/dL — ABNORMAL HIGH (ref 70–99)
HCT: 44.4 % (ref 36.0–46.0)
Hemoglobin: 15.1 g/dL — ABNORMAL HIGH (ref 12.0–15.0)
Lymphocytes Relative: 14.1 % (ref 12.0–46.0)
Lymphs Abs: 1.2 10*3/uL (ref 0.7–4.0)
Monocytes Relative: 6.8 % (ref 3.0–12.0)
Neutro Abs: 6.6 10*3/uL (ref 1.4–7.7)
RBC: 4.55 M/uL (ref 3.87–5.11)

## 2010-08-15 NOTE — Assessment & Plan Note (Signed)
Summary: Primary svc/ f/u multiple issues    Primary Provider/Referring Provider:  Dorthey Sawyer  CC:  2 month followup.  Pt c/o decrease in appetite over the past month.  She also c/o some occ difficulty with swallowing.  [name]  History of Present Illness: 40 yowf with minimal smoking hx  with morbid obesity who has a known history of asthmatic bronchitis, hypertension, osteoarthritis and mild dementia.  7/910--Presents for an acute office visit. Complains of pain in right side, states goes around to the right flank area onset today.   This am was moderate, better after lunch. No urinary symptoms, Denies chest pain, dyspnea, orthopnea, hemoptysis, fever, n/v/d, edema, headache. No otc used. no food association. Has had intermittent abd pain in past , comes and goes. sometimes has gas, bloating.   June 27, 2009--Complains of sinus pressure/congestion with green/yellow mucus, PND x1week, poss UTI with slight burning and frequency x1week, rawness/redness x2days and has a red spot in outer right calf that does not itch/burn x >37month. Accompanied by husband and daughter today.  August 16, 2009 ov for surgical clearance  very sedentary, rarely goes out except to eat. last walked at a department store maybe a month ago. not doing any mentally challenging activities either. however,     10/24/09--CPX  October 31, 2009--Presents for 1 week follow up and med review. We reviewed meds today and updated her med calendar. She still does not feel good, w/ decreased appetite, labs were essentially unremarkable except for low K+   January 04, 2010 ov c/o decrease in appetite over the past month.  She also c/o some occ difficulty with swallowing.  Has gerd per GI (Dr Doreatha Martin) denies non adherence with rx as outlined in med calendar and inventoried accurately under present meds.  sore on sacrum no better, using donut to sit.  Urgency also but no dysuria.  Pt denies any significant sore throat, , itching, sneezing,   nasal congestion or excess secretions,  fever, chills, sweats, unintended wt loss, pleuritic or exertional cp, hempoptysis, change in very poor  activity tolerance  orthopnea pnd or leg swelling   Current Medications (verified): 1)  Furosemide 20 Mg Tabs (Furosemide) .... Take 1 Tablet By Mouth Once A Day 2)  Ogen 1.25 1.5 Mg Tabs (Estropipate) .... Take 1 Tablet By Mouth Once A Day 3)  Citrucel   Powd (Methylcellulose (Laxative)) .[name].. 1 Tsp Once Daily 4)  Ascriptin 325 Mg  Tabs (Aspirin Buf(Alhyd-Mghyd-Cacar)) .[name].. 1 Once Daily 5)  Nasonex 50 Mcg/act  Susp (Mometasone Furoate) .[name].. 1-2 Puffs Each Nostril Two Times A Day 6)  Amlodipine Besylate 5 Mg Tabs (Amlodipine Besylate) .... Take 1 Tablet By Mouth Once A Day 7)  Potassium Chloride Cr 10 Meq  Cpcr (Potassium Chloride) .... 2  Once Daily 8)  Prozac 40 Mg  Caps (Fluoxetine Hcl) .[name].. 1 Once Daily 9)  Multivitamins   Tabs (Multiple Vitamin) .... Once Daily 10)  Protonix 40 Mg  Pack (Pantoprazole Sodium) .[name].. 1tab Once Daily 11)  Clonidine Hcl 0.1 Mg  Tabs (Clonidine Hcl) .... 1/2 Two Times A Day 12)  Aricept 10 Mg Tabs (Donepezil Hcl) .... Take 1 Tablet By Mouth Once A Day 13)  Oxybutynin Chloride 5 Mg  Tabs (Oxybutynin Chloride) .[name].. 1 Three Times A Day 14)  Namenda 10 Mg  Tabs (Memantine Hcl) .[name].. 1 Two Times A Day 15)  Promethazine Hcl 25 Mg  Tabs (Promethazine Hcl) .[name].. 1 Tab By Mouth Every 6 Hours As Needed  16)  Hyoscyamine Sulfate 0.125 Mg  Tabs (Hyoscyamine Sulfate) .[name].. 1-2 Every 6 Hours As Needed 17)  Alprazolam 0.25 Mg  Tabs (Alprazolam) .[name].. 1 Every 8 Hours As Needed 18)  Triamterene-Hctz 50-25 Mg  Caps (Triamterene-Hctz) .... **chart Said 75/25mg **  Once Daily As Needed 19)  Migrazone 325-65-100 Mg Caps (Apap-Isometheptene-Dichloral) .... 2 At Onset Then 1 Every 4 Hours As Needed 20)  Advil 200 Mg  Tabs (Ibuprofen) .... 3 Tabs W/ Meal Two Times A Day As Needed 21)  Hydrocodone-Acetaminophen 5-500 Mg Tabs  (Hydrocodone-Acetaminophen) .[name].. 1 Tab Every 6 Hours As Needed 22)  Valtrex 1 Gm  Tabs (Valacyclovir Hcl) .... Once Dailyx 7 Days As Needed 23)  Drixoral Cold/allergy 6-120 Mg Xr12h-Tab (Dexbrompheniramine-Pseudoeph) .... Take 1 Tablet By Mouth Once A Day As Needed 24)  Afrin Sinus 0.05 % Soln (Oxymetazoline Hcl) .... Two Times A Day X5days Before Nasonex  Allergies (verified): 1)  ! Pcn  Past History:  Past Medical History: COUGH (ICD-786.2) DEPRESSION (ICD-311) ANXIETY, CHRONIC (ICD-300.00) COLONIC POLYPS (ICD-211.3) DIVERTICULITIS, COLON (ICD-562.11)    - colonoscopy 03/21/2004 HYPERLIPIDEMIA, MIXED (ICD-272.2) BARRETT'S ESOPHAGUS, HX OF (ICD-V12.79)..............................[facility]   - EGD 03/21/2004 OBESITY (ICD-278.00)   - Target wt  =   < 158  for BMI < 30  UTI'S, CHRONIC (ICD-599.0).......................................................Cristina KitchenMarland KitchenPeterson OSTEOARTHRITIS (ICD-715.90) ASTHMATIC BRONCHITIS, ACUTE (ICD-466.0) DEMENTIA, MILD (ICD-294.8) HYPERTENSION (ICD-401.9) RHINITIS (ICD-472.0) Sacral Decubitus...........................................................................[facility] ---.Meds reviewed with pt education and computerized med calendar completed/adjusted-08/19/2008  Vital Signs:  Patient profile:   77 year old female Weight:      156 pounds O2 Sat:      95 % on Room air Temp:     97.1 degrees F oral Pulse rate:   88 / minute BP sitting:   92 / 62  (left arm) Cuff size:   large  Vitals Entered By: Vernie Murders (January 04, 2010 2:45 PM)  O2 Flow:  Room air  Physical Exam  Additional Exam:  obese chronically ill appearing  wf walks for 4  rolling walker with hand brakes nad   wt  175 August 16, 2009 > 165 October 24, 2009  > 156 January 04, 2010  HEENT: nl dentition, turbinates, and orophanx. Nl external ear canals without cough reflex NECK :  without JVD/Nodes/TM/ nl carotid upstrokes bilaterally LUNGS: no acc muscle  use, clear to A and P bilaterally without cough on insp or exp maneuvers CV:  RRR  no s3 or murmur or increase in P2, no edema   ABD:  soft and nontender with nl excursion in the supine position. No bruits or organomegaly, bowel sounds nl MS:  warm without deformities, calf tenderness, cyanosis or clubbing NEURO:  alert no focal deficits Skin:  Grade I/II sacral decub with mild erythema      Sodium                    143 mEq/L                   135-145   Potassium            [L]  2.9 mEq/L                   3.5-5.1   Chloride                  103 mEq/L  96-112   Carbon Dioxide            30 mEq/L                    19-32   Glucose              [H]  132 mg/dL                   53-66   BUN                       10 mg/dL                    4-40   Creatinine                1.1 mg/dL                   3.4-7.4   Calcium                   9.0 mg/dL                   2.5-95.6   GFR                       52.24 mL/min                >60  Tests: (2) CBC Platelet w/Diff (CBCD)   White Cell Count     [H]  11.1 K/uL                   4.5-10.5   Red Cell Count            4.04 Mil/uL                 3.87-5.11   Hemoglobin                13.9 g/dL                   38.7-56.4   Hematocrit                40.5 %                      36.0-46.0   MCV                  [H]  100.4 fl                    78.0-100.0   MCHC                      34.3 g/dL                   33.2-95.1   RDW                  [H]  16.2 %                      11.5-14.6   Platelet Count            231.0 K/uL                  150.0-400.0   Neutrophil %              72.8 %  43.0-77.0   Lymphocyte %              17.5 %                      12.0-46.0   Monocyte %                7.5 %                       3.0-12.0   Eosinophils%              1.9 %                       0.0-5.0   Basophils %               0.3 %                       0.0-3.0   Neutrophill Absolute [H]  8.1 K/uL                    1.4-7.7    Lymphocyte Absolute       2.0 K/uL                    0.7-4.0   Monocyte Absolute         0.8 K/uL                    0.1-1.0  Eosinophils, Absolute                             0.2 K/uL                    0.0-0.7   Basophils Absolute        0.0 K/uL                    0.0-0.1  Tests: (3) UDip w/Micro (URINE)   Color                     LT. YELLOW       RANGE:  Yellow;Lt. Yellow   Clarity                   CLEAR                       Clear   Specific Gravity          1.020                       1.000 - 1.030   Urine Ph                  6.0                         5.0-8.0   Protein                   NEGATIVE                    Negative   Urine Glucose             NEGATIVE  Negative   Ketones                   NEGATIVE                    Negative   Urine Bilirubin           NEGATIVE                    Negative   Blood                     MODERATE                    Negative   Urobilinogen              0.2                         0.0 - 1.0   Leukocyte Esterace        SMALL                       Negative   Nitrite                   NEGATIVE                    Negative   Urine WBC                 7-10/hpf                    0-2/hpf   Urine RBC                 11-20/hpf                   0-2/hpf   Urine Epith               Rare(0-4/hpf)               Rare(0-4/hpf)   Urine Bacteria            Many(>50/hpf)               None   Hyaline Casts             Presence of                 None  Impression & Recommendations:  Problem # 1:  GERD (ICD-530.81) Assoc with worsening dysphagia despite rx, needs gi referral since carries dx of barretts from old records  Her updated medication list for this problem includes:    Protonix 40 Mg Pack (Pantoprazole sodium) .[name]... 1tab once daily    Hyoscyamine Sulfate 0.125 Mg Tabs (Hyoscyamine sulfate) .[name]... 1-2 every 6 hours as needed    Pepcid Ac Maximum Strength 20 Mg Tabs (Famotidine) ..... One at bedtime  Orders: Misc. Referral (Misc.  Ref) Est. Patient Level IV (11914)  Problem # 2:  UTI'S, CHRONIC (ICD-599.0)  The following medications were removed from the medication list:    Cipro 250 Mg Tabs (Ciprofloxacin hcl) .[name]... 1 by mouth two times a day Her updated medication list for this problem includes:    Oxybutynin Chloride 5 Mg Tabs (Oxybutynin chloride) .[name]... 1 three times a day  Orders: T-Urine Culture (Spectrum Order) [phone]) Est. Patient Level IV (86578)  refer back to GU  Problem # 3:  DECUBITUS  ULCER, SACRUM (ICD-707.03)  Needs home health eval and derm referral  Nearing either NHP or hospice level care   Orders: Est. Patient Level IV (96045)  Problem # 4:  HYPOPOTASSOMIA (ICD-276.8) Prob not swallowing kdur -  See instructions for specific recommendations   Medications Added to Medication List This Visit: 1)  Pepcid Ac Maximum Strength 20 Mg Tabs (Famotidine) .... One at bedtime  Other Orders: TLB-BMP (Basic Metabolic Panel-BMET) (80048-METABOL) TLB-CBC Platelet - w/Differential (85025-CBCD) TLB-Udip ONLY (81003-UDIP) TLB-Udip w/ Micro (81001-URINE) Dermatology Referral (Derma)  Patient Instructions: 1)  See calendar for specific medication instructions and bring it back for each and every office visit for every healthcare provider you see.  Without it,  you may not receive the best quality medical care that we feel you deserve. 2)  stop furosemide 3)  add pepcid 20 mg at bedtime 4)  see Dr Vickey Huger re memory meds 5)  see Dr Vonita Moss re bladder  6)  See Patient Care Coordinator before leaving for home health assessment and GI referral and dermatology 7)  LATE ADD 8)  Change Kdur to 20 meq 2 two times a day x 5 days then 2 qam thereafter but have her let it completely dissolve in a cup of water (may take 10 minutes sitting in bottom of cup) then swallow it down   Appended Document: Primary svc/ f/u multiple issues  see late add on instructions re Kdur  Appended Document: Primary svc/  f/u multiple issues  LMOMTCB  Appended Document: Primary svc/ f/u multiple issues  I spoke with pt's spouse and notified of recs regarding K.  He wrote directions down and read them back to me correctly.  Wants to know when she needs to come back for followup.  D/C paper yesterday did not say.  Please advise, thanks!  Appended Document: Primary svc/ f/u multiple issues  needs to see Tammy NP in 4 weeks,sooner if needed  Appended Document: Primary svc/ f/u multiple issues  Spoke with pt's spouse and sched rov with TP in [place] per his request on 7/26 at 2:30 pm. Advised they call for sooner appt if needed.

## 2010-08-15 NOTE — Assessment & Plan Note (Signed)
Summary: Primary/ surgical clearance for right shoulder surgery   Primary Provider/Referring Provider:  Dorthey Sawyer  CC:  Followup for surgery clearance.  Pt to have right shoulder.  She denies any complaints today and states that overall her breathing has been okay.Marland Kitchen  History of Present Illness: 37 yowf with minimal smoking hx  with morbid obesity who has a known history of asthmatic bronchitis, hypertension, osteoarthritis and mild dementia.  ov 05/06/08 for comprehensive eval with no specific co's.  No sob or cough.  No recent flare of rhinitis symptoms.  Main co = can't you take me off some of these medications"   December 7, 2009ov  follow up and med refills. she is accompanied by her daughter. Concerned pt does not want to bathe daily - only sponge bath, does not want her bottom to get sores. Also needs BMD set up. Doing well without changes in baseline. Memory stable without significant decline.   August 02, 2008 ov: new onset moderate bandlike subacute onset generalize ha and diarrhea since 1/15  rx with phenergan starting to get better no rigors. no abd pain or dysuria. no cough, sinus co, sore throat or viz/neuro co's or sob.    08/19/2008--returns for follow up and med review. Needs refills. Feeling better w/ resolved diarrhea. Headaches- chronic but better. . --med calendar adjusted.   August 30, 2008--Presents for an acute work in visit. complains of  4 days of right low abdominal low back pain down into groin area. urinary frequecy and urgency. no dyspuria, hematuria. no fever, chest pain, bloody stools, or dyspnea. Has used pain meds w/ some relief.  . Husband and daughter w/ pt today, w/ several questions about meds.   September 23, 2008 --Presents for an acute ov, c/o cough-yellow x 2-3 wks.,sob same , occass. wheezing, no fcs. .symptoms not improving. worse last few days w/ thick yellow mucous.  7/910--Presents for an acute office visit. Complains of pain in right side, states  goes around to the right flank area onset today.   This am was moderate, better after lunch. No urinary symptoms, Denies chest pain, dyspnea, orthopnea, hemoptysis, fever, n/v/d, edema, headache. No otc used. no food association. Has had intermittent abd pain in past , comes and goes. sometimes has gas, bloating.   June 27, 2009--Complains of sinus pressure/congestion with green/yellow mucus, PND x1week, poss UTI with slight burning and frequency x1week, rawness/redness x2days and has a red spot in outer right calf that does not itch/burn x >90month. Accompanied by husband and daughter today.  August 16, 2009 ov for surgical clearance  very sedentary, rarely goes out except to eat. last walked at a department store maybe a month ago. not doing any mentally challenging activities either. however,  denies any significant sore throat, dysphagia, itching, sneezing,  nasal congestion or excess secretions,  fever, chills, sweats, unintended wt loss, pleuritic or exertional cp, hempoptysis, change in activity tolerance  orthopnea pnd or leg swelling.  Current Medications (verified): 1)  Furosemide 20 Mg Tabs (Furosemide) .... Take 1 Tablet By Mouth Once A Day 2)  Ogen 1.25 1.5 Mg Tabs (Estropipate) .... Take 1 Tablet By Mouth Once A Day 3)  Citrucel   Powd (Methylcellulose (Laxative)) .... Once Daily 4)  Ascriptin 325 Mg  Tabs (Aspirin Buf(Alhyd-Mghyd-Cacar)) .Marland Kitchen.. 1 Once Daily 5)  Nasonex 50 Mcg/act  Susp (Mometasone Furoate) .... Two Times A Day 6)  Amlodipine Besylate 5 Mg Tabs (Amlodipine Besylate) .... Take 1 Tablet By Mouth Once A Day  7)  Potassium Chloride Cr 10 Meq  Cpcr (Potassium Chloride) .Marland Kitchen.. 1 Once Daily 8)  Prozac 40 Mg  Caps (Fluoxetine Hcl) .Marland Kitchen.. 1 Once Daily 9)  Multivitamins   Tabs (Multiple Vitamin) .... Once Daily 10)  Protonix 40 Mg  Pack (Pantoprazole Sodium) .Marland Kitchen.. 1tab Once Daily 11)  Singulair 10 Mg  Tabs (Montelukast Sodium) .Marland Kitchen.. 1 At Bedtime 12)  Clonidine Hcl 0.1 Mg  Tabs  (Clonidine Hcl) .... 1/2 Two Times A Day 13)  Aricept 10 Mg Tabs (Donepezil Hcl) .... Take 1 Tablet By Mouth Once A Day 14)  Oxybutynin Chloride 5 Mg  Tabs (Oxybutynin Chloride) .Marland Kitchen.. 1 Three Times A Day 15)  Namenda 10 Mg  Tabs (Memantine Hcl) .Marland Kitchen.. 1 Two Times A Day 16)  Promethazine Hcl 25 Mg  Tabs (Promethazine Hcl) .... Every 4 Hours As Needed 17)  Hyoscyamine Sulfate 0.125 Mg  Tabs (Hyoscyamine Sulfate) .... Every 6 Hours As Needed 18)  Alprazolam 0.25 Mg  Tabs (Alprazolam) .... Every 8 Hours As Needed 19)  Triamterene-Hctz 50-25 Mg  Caps (Triamterene-Hctz) .... **chart Said 75/25mg **  Once Daily As Needed 20)  Advil 200 Mg  Tabs (Ibuprofen) .... 500mg  Two Times A Day As Needed 21)  Valtrex 1 Gm  Tabs (Valacyclovir Hcl) .... Once Daily As Needed 22)  Migrazone 325-65-100 Mg Caps (Apap-Isometheptene-Dichloral) .Marland Kitchen.. 1-2 Every 4 Hr As Needed Headaches. 23)  Drixoral Cold/allergy 6-120 Mg Xr12h-Tab (Dexbrompheniramine-Pseudoeph) .... Prn 24)  Afrin Sinus 0.05 % Soln (Oxymetazoline Hcl) .... Prn 25)  Hydrocodone-Acetaminophen 5-500 Mg Tabs (Hydrocodone-Acetaminophen) .... Take 1 Tablet By Mouth Three Times A Day As Needed Severe Pain  Allergies (verified): 1)  ! Pcn  Vital Signs:  Patient profile:   77 year old female Weight:      175 pounds O2 Sat:      98 % on Room air Temp:     97.4 degrees F oral Pulse rate:   76 / minute BP sitting:   130 / 72  (left arm)  Vitals Entered By: Vernie Murders (August 16, 2009 2:45 PM)  O2 Flow:  Room air  Physical Exam  Additional Exam:  obese wf walks for 4  rolling walker with hand brakes nad  wt 174 > 175 August 16, 2009  Tuesday, early February 19 something, President Obama, reading books not watching news, can't name one HEENT: nl dentition, turbinates, and orophanx. Nl external ear canals without cough reflex NECK :  without JVD/Nodes/TM/ nl carotid upstrokes bilaterally LUNGS: no acc muscle use, clear to A and P bilaterally without  cough on insp or exp maneuvers CV:  RRR  no s3 or murmur or increase in P2, no edema   ABD:  soft and nontender with nl excursion in the supine position. No bruits or organomegaly, bowel sounds nl MS:  warm without deformities, calf tenderness, cyanosis or clubbing SKIN: warm and dry without lesions   NEURO:  alert no focal deficits, abn sensorium as above.      Impression & Recommendations:  Problem # 1:  DEMENTIA, MILD (ICD-294.8)  Continue on namenda and aricept - needs to do more mentally challenging activities, discussed with husband.  high risk hosp acquired delirium,  asp pneumonia discussed  Orders: Est. Patient Level IV (98119)  Problem # 2:  HYPERTENSION (ICD-401.9)  Her updated medication list for this problem includes:    Furosemide 20 Mg Tabs (Furosemide) .Marland Kitchen... Take 1 tablet by mouth once a day    Amlodipine Besylate 5  Mg Tabs (Amlodipine besylate) .Marland Kitchen... Take 1 tablet by mouth once a day    Clonidine Hcl 0.1 Mg Tabs (Clonidine hcl) .Marland Kitchen... 1/2 two times a day    Triamterene-hctz 50-25 Mg Caps (Triamterene-hctz) .Marland Kitchen... **chart said 75/25mg **  once daily as needed  Orders: Est. Patient Level IV (16109)  Problem # 3:  RHINITIS (ICD-472.0)  Each maintenance medication was reviewed in detail including most importantly the difference between maintenance and as needed and under what circumstances the prns are to be used. This was done in the context of a medication calendar review which provided the patient with a user-friendly unambiguous mechanism for medication administration and reconciliation and provides an action plan for all active problems. It is critical that this be shown to every doctor  for modification during the office visit if necessary so the patient can use it as a working document.  ok to try off singulair   Orders: Est. Patient Level IV (60454)  Medications Added to Medication List This Visit: 1)  Hydrocodone-acetaminophen 5-500 Mg Tabs  (Hydrocodone-acetaminophen) .... Take 1 tablet by mouth three times a day as needed severe pain  Patient Instructions: 1)  Cleared for shoulder surgery. You are increased but not prohibitive risk of post op complications related to your cognitive impairement and general debilitation. You would benefit for prolonged rehab with goal of greater degree of physical activity. 2)  ok to try off singulair when prescription runs out 3)  See Tammy NP  after release from hospital or rehab with all your medications, even over the counter meds, separated in two separate bags, the ones you take no matter what vs the ones you stop once you feel better and take only as needed.  She will generate for you a new user friendly medication calendar that will put [place] all on the same page re: your medication use. Prescriptions: HYDROCODONE-ACETAMINOPHEN 5-500 MG TABS (HYDROCODONE-ACETAMINOPHEN) Take 1 tablet by mouth three times a day as needed severe pain  #40 x 0   Entered and Authorized by:   Nyoka Cowden MD   Signed by:   Nyoka Cowden MD on 08/16/2009   Method used:   Print then Give to Patient   RxID:   [id number]

## 2010-08-15 NOTE — Progress Notes (Signed)
Summary: speak to nurse  Phone Note From Other Clinic Call back at [phone]   Caller: kristen//[facility] Call For: wert Summary of Call: States pt has a red rash under her breasts would like something called in.//[address] Initial call taken by: Darletta Moll,  November 07, 2009 9:23 AM  Follow-up for Phone Call        kristen with [facility] states pt has developed a yeast infections under folds of both breasts. She states rash is red and raised and itches x 4 days. Please advise.Carron Curie CMA  November 07, 2009 10:37 AM   Additional Follow-up for Phone Call Additional follow up Details #1::        Nystatin under breast two times a day for 10 days for rash     Additional Follow-up for Phone Call Additional follow up Details #2::    kristen aware rx sent.Carron Curie CMA  November 07, 2009 10:50 AM   New/Updated Medications: NYSTATIN 100000 UNIT/GM CREA (NYSTATIN) apply to rash two times a day for 10 days Prescriptions: NYSTATIN 100000 UNIT/GM CREA (NYSTATIN) apply to rash two times a day for 10 days  #1 x 2   Entered and Authorized by:   Rubye Oaks NP   Signed by:   Loron Weimer NP on 11/07/2009   Method used:   Electronically to        [id number]* (retail)       [address]       [place], [place]  [address]       Ph: [phone]       Fax: [phone]   RxID:   [id number]

## 2010-08-15 NOTE — Progress Notes (Signed)
Summary: order being sent  Phone Note Call from Patient   Caller: [facility]-Kristen Call For: wert Summary of Call: Schedule - 1 time per week for 4 weeks - for stage 1 pressure ulcer on her backside. - got health aide to help w/bathing, also asked for PT.  Will fax order over to [facility] for signature. Initial call taken by: Eugene Gavia,  November 01, 2009 1:17 PM  Follow-up for Phone Call        Molli Knock, will place in MW lookat to be signed once received. Follow-up by: Vernie Murders,  November 01, 2009 2:54 PM

## 2010-08-15 NOTE — Procedures (Signed)
Summary: EGD Victorino Dike)   EGD  Procedure date:  03/21/2004  Findings:      530.2:Barretts Location: [facility]   Patient Name: [name], [name] MRN:  Procedure Procedures: Panendoscopy (EGD) CPT: 43235.    with biopsy(s)/brushing(s). CPT: D1846139.  Personnel: Endoscopist: Ulyess Mort, MD.  Exam Location: Exam performed in Outpatient Clinic. Outpatient  Patient Consent: Procedure, Alternatives, Risks and Benefits discussed, consent obtained, from patient. Consent was obtained by the RN.  Indications  Surveillance of: Barrett's Esophagus.  History  Current Medications: Patient is not currently taking Coumadin.  Pre-Exam Physical: Entire physical exam was normal.  Exam Exam Info: Maximum depth of insertion Duodenum, intended Duodenum. Vocal cords visualized. Gastric retroflexion performed. Images were not taken. ASA Classification: II. Tolerance: good.  Sedation Meds: Patient assessed and found to be appropriate for moderate (conscious) sedation. Fentanyl 25 mcg. given IV. Versed 5 mg. given IV. Cetacaine Spray 2 sprays given aerosolized.  Monitoring: BP and pulse monitoring done. Oximetry used. Supplemental O2 given  Findings - BARRETT'S ESOPHAGUS: Length of Barrett's 3 cm. Biopsy/Barrett's taken. ICD9: Barrett's: 530.2.  POLYP: in Cardia. Maximum size: 5 mm. sessile polyp. Procedure: biopsy without cautery, removed, Polyp retrieved, sent to pathology. ICD9: .  - MUCOSAL ABNORMALITY: Pyloric Sphincter to Jejunum. Granular mucosa.  - MUCOSAL ABNORMALITY: Body to Antrum. Erythematous mucosa.   Assessment Abnormal examination, see findings above.  Diagnoses: 530.2: Barrett's.  .   Events  Unplanned Intervention: No unplanned interventions were required.  Unplanned Events: There were no complications. Plans Medication(s): Await pathology. Continue current medications.  Patient Education: Patient given standard instructions for:  Barrett's. Polyps. 1---2 years pending bx,s.  Disposition: After procedure patient sent to recovery. After recovery patient sent home.  This report was created from the original endoscopy report, which was reviewed and signed by the above listed endoscopist.

## 2010-08-15 NOTE — Letter (Signed)
Summary: Referral/[facility]  Referral/[facility]   Imported By: Lester Welcome 11/03/2009 13:49:49  _____________________________________________________________________  External Attachment:    Type:   Image     Comment:   External Document

## 2010-08-15 NOTE — Progress Notes (Signed)
Summary: call back  Phone Note Call from Patient Call back at Home Phone [phone]   Caller: Tomasa Hosteller Call For: wert Reason for Call: Talk to Nurse Summary of Call: pt's husband ask that [facility] call him back. Initial call taken by: Eugene Gavia,  October 17, 2009 3:17 PM  Follow-up for Phone Call        called and spoke wtih pt's spouse, Jesusita Oka.  Dan cancelled pt's appt with TP for tomorrow stating pt states she is "too tired to come in and be seen tomorrow and requests to stay home and rest for the day."  Will call back to schedule ov.  cancelled appt.  nothing further needed.  Arman Filter LPN  October 18, 979 3:23 PM

## 2010-08-15 NOTE — Letter (Signed)
Summary: [name]  [name]   Imported By: Sherian Rein 11/24/2009 10:48:58  _____________________________________________________________________  External Attachment:    Type:   Image     Comment:   External Document

## 2010-08-15 NOTE — Progress Notes (Signed)
Summary: ok to refill valtrex     Phone Note Call from Patient Call back at Home Phone [phone]   Caller: Spouse-Dan Call For: wert Reason for Call: Talk to Nurse Summary of Call: Herpes flare up, hpt had a sore on her buttocks and husband took bandage off and some skin tore off, bleeding and he thinks may need suture. Please advise as to what pt needs for both problems.  Do not have any medicine for the Herpes either.  Also need her Valtrex called in. [facility] - [address] Initial call taken by: Eugene Gavia,  October 17, 2009 10:16 AM  Follow-up for Phone Call        called and spoke with pt's spouse, Jesusita Oka.  Jesusita Oka states pt currently has a herpes flareup and needs rx called into pharmacy.   also, Jesusita Oka states pt had a herpes sore on her buttocks that he was "self medicating" with a bandaid.  Jesusita Oka states when he removed bandaid this morning, pt's skin tore along with the herpes sore and "bled a lot." Jesusita Oka states he applied another bandaid on sore to stop bleeding, which it did.  Informed husband MW not in office this afternoon.  ( and TP not here today)  Suggested pt see her GYN today but husband states pt does not have a GYN and wanted this messaged addressed by MW.  Will forward message to MW.  Aundra Millet Reynolds LPN  October 18, 979 10:38 AM  I refilled the valtrex but would rec ov with Shyane Fossum asap to look at all her problems Follow-up by: Nyoka Cowden MD,  October 17, 2009 2:02 PM  Additional Follow-up for Phone Call Additional follow up Details #1::        LMOMTCB to inform pt rx sent to pharmacy and also to inform her I scheduled her an appt with TP for tomorrow at 10:15am.  Arman Filter LPN  October 17, 1912 2:16 PM   pt husband advised of rx and appt. Carron Curie CMA  October 17, 2009 3:10 PM     New/Updated Medications: VALTREX 1 GM  TABS (VALACYCLOVIR HCL) once dailyx 7 days Prescriptions: VALTREX 1 GM  TABS (VALACYCLOVIR HCL) once dailyx 7 days  #7 x 0   Entered and Authorized  by:   Nyoka Cowden MD   Signed by:   Nyoka Cowden MD on 10/17/2009   Method used:   Electronically to        [facility] [phone]* (retail)       [address]       [place], [place]  [address]       Ph: [phone]       Fax: [phone]   RxID:   [id number]

## 2010-08-15 NOTE — Progress Notes (Signed)
Summary: verbal order  Phone Note Call from Patient Call back at [phone]   Caller: Chip Boer Call For: Oveda Dadamo Reason for Call: Talk to Nurse Summary of Call: want to bump down to once a week on home health - ulcers are better.  Need verbal. Initial call taken by: Eugene Gavia,  February 23, 2010 2:26 PM  Follow-up for Phone Call        Is this okay to give verbal order? Pls advise thanks! Follow-up by: Vernie Murders,  February 23, 2010 2:34 PM  Additional Follow-up for Phone Call Additional follow up Details #1::        ok Additional Follow-up by: Nyoka Cowden MD,  February 23, 2010 2:48 PM    Additional Follow-up for Phone Call Additional follow up Details #2::    called to speak to kristen---lmovm to give her the ok to decrease home visits once weekly. Randell Loop CMA  February 23, 2010 2:51 PM    kristen called back and is aware of MW order for once a week home visits. Randell Loop [facility]  February 23, 2010 3:48 PM

## 2010-08-15 NOTE — Assessment & Plan Note (Signed)
Summary: NP follow up   Primary Provider/Referring Provider:  Dorthey Sawyer  CC:  4 week follow up - states appetite and swallowing have improved.  History of Present Illness: 77 yowf with minimal smoking hx  with morbid obesity who has a known history of asthmatic bronchitis, hypertension, osteoarthritis and mild dementia.  7/910--Presents for an acute office visit. Complains of pain in right side, states goes around to the right flank area onset today.   This am was moderate, better after lunch. No urinary symptoms, Denies chest pain, dyspnea, orthopnea, hemoptysis, fever, n/v/d, edema, headache. No otc used. no food association. Has had intermittent abd pain in past , comes and goes. sometimes has gas, bloating.   June 27, 2009--Complains of sinus pressure/congestion with green/yellow mucus, PND x1week, poss UTI with slight burning and frequency x1week, rawness/redness x2days and has a red spot in outer right calf that does not itch/burn x >64month. Accompanied by husband and daughter today.  August 16, 2009 ov for surgical clearance  very sedentary, rarely goes out except to eat. last walked at a department store maybe a month ago. not doing any mentally challenging activities either. however,     10/24/09--CPX  October 31, 2009--Presents for 1 week follow up and med review. We reviewed meds today and updated her med calendar. She still does not feel good, w/ decreased appetite, labs were essentially unremarkable except for low K+   January 04, 2010 ov c/o decrease in appetite over the past month.  She also c/o some occ difficulty with swallowing.  Has gerd per GI (Dr Doreatha Martin) denies non adherence with rx as outlined in med calendar and inventoried accurately under present meds.  sore on sacrum no better, using donut to sit.  Urgency also but no dysuria.   --stopped lasix, pepcid added, decreased k-dur.     February 07, 2010 -returns for follow up 4 week follow up - states appetite and swallowing  have improved. Last visit Pepcid added. She is going to wound care center in [facility] for sacral decub. SHe has quite abit of pain from this. Area is excoriated. and sensitive. She is taking her meds well. Husband is here with her today, he helps her with daily care. Denies chest pain, dyspnea, orthopnea, hemoptysis, fever, n/v/d, edema, headache.    Medications Prior to Update: 1)  Ogen 1.25 1.5 Mg Tabs (Estropipate) .... Take 1 Tablet By Mouth Once A Day 2)  Citrucel   Powd (Methylcellulose (Laxative)) .Marland Kitchen.. 1 Tsp Once Daily 3)  Ascriptin 325 Mg  Tabs (Aspirin Buf(Alhyd-Mghyd-Cacar)) .Marland Kitchen.. 1 Once Daily 4)  Nasonex 50 Mcg/act  Susp (Mometasone Furoate) .Marland Kitchen.. 1-2 Puffs Each Nostril Two Times A Day 5)  Amlodipine Besylate 5 Mg Tabs (Amlodipine Besylate) .... Take 1 Tablet By Mouth Once A Day 6)  Potassium Chloride Cr 10 Meq  Cpcr (Potassium Chloride) .... 2  Once Daily 7)  Prozac 40 Mg  Caps (Fluoxetine Hcl) .Marland Kitchen.. 1 Once Daily 8)  Multivitamins   Tabs (Multiple Vitamin) .... Once Daily 9)  Protonix 40 Mg  Pack (Pantoprazole Sodium) .Marland Kitchen.. 1tab Once Daily 10)  Clonidine Hcl 0.1 Mg  Tabs (Clonidine Hcl) .... 1/2 Two Times A Day 11)  Aricept 10 Mg Tabs (Donepezil Hcl) .... Take 1 Tablet By Mouth Once A Day 12)  Oxybutynin Chloride 5 Mg  Tabs (Oxybutynin Chloride) .Marland Kitchen.. 1 Three Times A Day 13)  Namenda 10 Mg  Tabs (Memantine Hcl) .Marland Kitchen.. 1 Two Times A Day 14)  Promethazine  Hcl 25 Mg  Tabs (Promethazine Hcl) .Marland Kitchen.. 1 Tab By Mouth Every 6 Hours As Needed 15)  Hyoscyamine Sulfate 0.125 Mg  Tabs (Hyoscyamine Sulfate) .Marland Kitchen.. 1-2 Every 6 Hours As Needed 16)  Alprazolam 0.25 Mg  Tabs (Alprazolam) .Marland Kitchen.. 1 Every 8 Hours As Needed 17)  Triamterene-Hctz 50-25 Mg  Caps (Triamterene-Hctz) .... **chart Said 75/25mg **  Once Daily As Needed 18)  Migrazone 325-65-100 Mg Caps (Apap-Isometheptene-Dichloral) .... 2 At Onset Then 1 Every 4 Hours As Needed 19)  Advil 200 Mg  Tabs (Ibuprofen) .... 3 Tabs W/ Meal Two Times A Day As  Needed 20)  Hydrocodone-Acetaminophen 5-500 Mg Tabs (Hydrocodone-Acetaminophen) .Marland Kitchen.. 1 Tab Every 6 Hours As Needed 21)  Valtrex 1 Gm  Tabs (Valacyclovir Hcl) .... Once Dailyx 7 Days As Needed 22)  Drixoral Cold/allergy 6-120 Mg Xr12h-Tab (Dexbrompheniramine-Pseudoeph) .... Take 1 Tablet By Mouth Once A Day As Needed 23)  Afrin Sinus 0.05 % Soln (Oxymetazoline Hcl) .... Two Times A Day X5days Before Nasonex 24)  Pepcid Ac Maximum Strength 20 Mg Tabs (Famotidine) .... One At Bedtime 25)  Sports coach (Misc. Devices) .Marland Kitchen.. 1 Gel Cushion For Pt's Wheelchair Dx: Stage 2 Pressure Ulcer  Current Medications (verified): 1)  Ogen 1.25 1.5 Mg Tabs (Estropipate) .... Take 1 Tablet By Mouth Once A Day 2)  Citrucel   Powd (Methylcellulose (Laxative)) .Marland Kitchen.. 1 Tsp Once Daily 3)  Ascriptin 325 Mg  Tabs (Aspirin Buf(Alhyd-Mghyd-Cacar)) .Marland Kitchen.. 1 Once Daily 4)  Nasonex 50 Mcg/act  Susp (Mometasone Furoate) .Marland Kitchen.. 1-2 Puffs Each Nostril Two Times A Day 5)  Amlodipine Besylate 5 Mg Tabs (Amlodipine Besylate) .... Take 1 Tablet By Mouth Once A Day 6)  Klor-Con M20 20 Meq Cr-Tabs (Potassium Chloride Crys Cr) .... 2 Tabs By Mouth  Every Morning 7)  Prozac 40 Mg  Caps (Fluoxetine Hcl) .Marland Kitchen.. 1 Once Daily 8)  Multivitamins   Tabs (Multiple Vitamin) .... Once Daily 9)  Protonix 40 Mg  Pack (Pantoprazole Sodium) .Marland Kitchen.. 1tab Once Daily 10)  Clonidine Hcl 0.1 Mg  Tabs (Clonidine Hcl) .... 1/2 Two Times A Day 11)  Aricept 10 Mg Tabs (Donepezil Hcl) .... Take 1 Tablet By Mouth Once A Day 12)  Oxybutynin Chloride 5 Mg  Tabs (Oxybutynin Chloride) .Marland Kitchen.. 1 Three Times A Day 13)  Namenda 10 Mg  Tabs (Memantine Hcl) .Marland Kitchen.. 1 Two Times A Day 14)  Promethazine Hcl 25 Mg  Tabs (Promethazine Hcl) .Marland Kitchen.. 1 Tab By Mouth Every 6 Hours As Needed 15)  Hyoscyamine Sulfate 0.125 Mg  Tabs (Hyoscyamine Sulfate) .Marland Kitchen.. 1-2 Every 6 Hours As Needed 16)  Alprazolam 0.25 Mg  Tabs (Alprazolam) .Marland Kitchen.. 1 Every 8 Hours As Needed 17)  Triamterene-Hctz 50-25  Mg  Caps (Triamterene-Hctz) .... **chart Said 75/25mg **  Once Daily As Needed 18)  Migrazone 325-65-100 Mg Caps (Apap-Isometheptene-Dichloral) .... 2 At Onset Then 1 Every 4 Hours As Needed 19)  Advil 200 Mg  Tabs (Ibuprofen) .... 3 Tabs W/ Meal Two Times A Day As Needed 20)  Hydrocodone-Acetaminophen 5-500 Mg Tabs (Hydrocodone-Acetaminophen) .Marland Kitchen.. 1 Tab Every 6 Hours As Needed 21)  Valtrex 1 Gm  Tabs (Valacyclovir Hcl) .... Once Dailyx 7 Days As Needed 22)  Drixoral Cold/allergy 6-120 Mg Xr12h-Tab (Dexbrompheniramine-Pseudoeph) .... Take 1 Tablet By Mouth Once A Day As Needed 23)  Afrin Sinus 0.05 % Soln (Oxymetazoline Hcl) .... Two Times A Day X5days Before Nasonex 24)  Pepcid Ac Maximum Strength 20 Mg Tabs (Famotidine) .... One At Bedtime 25)  Gaffer  Misc (Misc. Devices) .Marland Kitchen.. 1 Gel Cushion For Pt's Wheelchair Dx: Stage 2 Pressure Ulcer  Allergies (verified): 1)  ! Pcn  Past History:  Family History: Last updated: 01/22/2008 Family History of Breast Cancer:Mother, daughter Rectal Cancer: Mother  Social History: Last updated: 05/06/2008 Married Occupation: Housewife Patient smoked only as teenager Alcohol Use - no Illicit Drug Use - no  Risk Factors: Smoking Status: never (01/22/2008)  Past Medical History: COUGH (ICD-786.2) DEPRESSION (ICD-311) ANXIETY, CHRONIC (ICD-300.00) COLONIC POLYPS (ICD-211.3) DIVERTICULITIS, COLON (ICD-562.11)    - colonoscopy 03/21/2004 HYPERLIPIDEMIA, MIXED (ICD-272.2) BARRETT'S ESOPHAGUS, HX OF (ICD-V12.79).........................[facility]   - EGD 03/21/2004 OBESITY (ICD-278.00)   - Target wt  =   < 158  for BMI < 30  UTI'S, CHRONIC (ICD-599.0).......................................................Marland KitchenMarland KitchenPeterson OSTEOARTHRITIS (ICD-715.90) ASTHMATIC BRONCHITIS, ACUTE (ICD-466.0) DEMENTIA, MILD (ICD-294.8) HYPERTENSION (ICD-401.9) RHINITIS (ICD-472.0) Sacral  Decubitus...........................................................................Marland KitchenDermatology-- [facility] [place] [facility]  COMPLEX MED REGIMEN ---.Meds reviewed with pt education and computerized med calendar completed/adjusted-08/19/2008  Vital Signs:  Patient profile:   77 year old female Height:      62 inches Weight:      159.13 pounds BMI:     29.21 O2 Sat:      95 % on Room air Temp:     97.8 degrees F oral Pulse rate:   77 / minute BP sitting:   108 / 62  (left arm) Cuff size:   large  Vitals Entered By: Boone Master CNA/MA (February 07, 2010 4:56 PM)  O2 Flow:  Room air  Physical Exam  Additional Exam:  obese chronically ill appearing  wf walks for 4  rolling walker with hand brakes nad   wt  175 August 16, 2009 > 165 October 24, 2009  > 156 January 04, 2010 >>159 HEENT: nl dentition, turbinates, and orophanx. Nl external ear canals without cough reflex NECK :  without JVD/Nodes/TM/ nl carotid upstrokes bilaterally LUNGS: no acc muscle use, clear to A and P bilaterally without cough on insp or exp maneuvers CV:  RRR  no s3 or murmur or increase in P2, no edema   ABD:  soft and nontender with nl excursion in the supine position. No bruits or organomegaly, bowel sounds nl MS:  warm without deformities, calf tenderness, cyanosis or clubbing NEURO:  alert no focal deficits Skin:  Grade I/II sacral decub with mild erythema      Impression & Recommendations:  Problem # 1:  HYPOPOTASSOMIA (ICD-276.8) recheck bmet  Orders: TLB-BMP (Basic Metabolic Panel-BMET) (80048-METABOL) Est. Patient Level III (72536)  Problem # 2:  DECUBITUS ULCER, SACRUM (ICD-707.03) cont w/ wound care center   Problem # 3:  GERD (ICD-530.81)  cont on same meds.  Her updated medication list for this problem includes:    Protonix 40 Mg Pack (Pantoprazole sodium) .Marland Kitchen... 1tab once daily    Hyoscyamine Sulfate 0.125 Mg Tabs (Hyoscyamine sulfate) .Marland Kitchen... 1-2 every 6 hours as needed    Pepcid Ac  Maximum Strength 20 Mg Tabs (Famotidine) ..... One at bedtime  Orders: Est. Patient Level III (64403)  Problem # 4:  HYPERTENSION (ICD-401.9) controlled on med.s  Her updated medication list for this problem includes:    Amlodipine Besylate 5 Mg Tabs (Amlodipine besylate) .Marland Kitchen... Take 1 tablet by mouth once a day    Clonidine Hcl 0.1 Mg Tabs (Clonidine hcl) .Marland Kitchen... 1/2 two times a day    Triamterene-hctz 50-25 Mg Caps (Triamterene-hctz) .Marland Kitchen... **chart said 75/25mg **  once daily as needed  Orders: Est. Patient Level  III [id number])  BP today: 108/62 Prior BP: 92/62 (01/04/2010)  Labs Reviewed: K+: 2.9 (01/04/2010) Creat: : 1.1 (01/04/2010)     Medications Added to Medication List This Visit: 1)  Klor-con M20 20 Meq Cr-tabs (Potassium chloride crys cr) .... 2 tabs by mouth  every morning  Complete Medication List: 1)  Ogen 1.25 1.5 Mg Tabs (Estropipate) .... Take 1 tablet by mouth once a day 2)  Citrucel Powd (Methylcellulose (laxative)) .Marland Kitchen.. 1 tsp once daily 3)  Ascriptin 325 Mg Tabs (Aspirin buf(alhyd-mghyd-cacar)) .Marland Kitchen.. 1 once daily 4)  Nasonex 50 Mcg/act Susp (Mometasone furoate) .Marland Kitchen.. 1-2 puffs each nostril two times a day 5)  Amlodipine Besylate 5 Mg Tabs (Amlodipine besylate) .... Take 1 tablet by mouth once a day 6)  Klor-con M20 20 Meq Cr-tabs (Potassium chloride crys cr) .... 2 tabs by mouth  every morning 7)  Prozac 40 Mg Caps (Fluoxetine hcl) .Marland Kitchen.. 1 once daily 8)  Multivitamins Tabs (Multiple vitamin) .... Once daily 9)  Protonix 40 Mg Pack (Pantoprazole sodium) .Marland Kitchen.. 1tab once daily 10)  Clonidine Hcl 0.1 Mg Tabs (Clonidine hcl) .... 1/2 two times a day 11)  Aricept 10 Mg Tabs (Donepezil hcl) .... Take 1 tablet by mouth once a day 12)  Oxybutynin Chloride 5 Mg Tabs (Oxybutynin chloride) .Marland Kitchen.. 1 three times a day 13)  Namenda 10 Mg Tabs (Memantine hcl) .Marland Kitchen.. 1 two times a day 14)  Promethazine Hcl 25 Mg Tabs (Promethazine hcl) .Marland Kitchen.. 1 tab by mouth every 6 hours as needed 15)   Hyoscyamine Sulfate 0.125 Mg Tabs (Hyoscyamine sulfate) .Marland Kitchen.. 1-2 every 6 hours as needed 16)  Alprazolam 0.25 Mg Tabs (Alprazolam) .Marland Kitchen.. 1 every 8 hours as needed 17)  Triamterene-hctz 50-25 Mg Caps (Triamterene-hctz) .... **chart said 75/25mg **  once daily as needed 18)  Migrazone 325-65-100 Mg Caps (Apap-isometheptene-dichloral) .... 2 at onset then 1 every 4 hours as needed 19)  Advil 200 Mg Tabs (Ibuprofen) .... 3 tabs w/ meal two times a day as needed 20)  Hydrocodone-acetaminophen 5-500 Mg Tabs (Hydrocodone-acetaminophen) .Marland Kitchen.. 1 tab every 6 hours as needed 21)  Valtrex 1 Gm Tabs (Valacyclovir hcl) .... Once dailyx 7 days as needed 22)  Drixoral Cold/allergy 6-120 Mg Xr12h-tab (Dexbrompheniramine-pseudoeph) .... Take 1 tablet by mouth once a day as needed 23)  Afrin Sinus 0.05 % Soln (Oxymetazoline hcl) .... Two times a day x5days before nasonex 24)  Pepcid Ac Maximum Strength 20 Mg Tabs (Famotidine) .... One at bedtime 25)  Development worker, community (Misc. devices) .Marland Kitchen.. 1 gel cushion for pt's wheelchair dx: stage 2 pressure ulcer  Patient Instructions: 1)  Continue with wound care center  2)  Same meds.  3)  I will call with labs.  4)  follow up Dr. Sherene Sires in 2-3 months  5)  Please contact office for sooner follow up if symptoms do not improve or worsen

## 2010-08-15 NOTE — Procedures (Signed)
Summary: colonoscopy  Victorino Dike)   Colonoscopy  Procedure date:  03/21/2004  Findings:      Results: Polyp.  Results: Diverticulosis.       Location:  [facility].   Patient Name: [name], [name] MRN:  Procedure Procedures: Colonoscopy CPT: 6368069431.  Personnel: Endoscopist: Ulyess Mort, MD.  Exam Location: Exam performed in Outpatient Clinic. Outpatient  Patient Consent: Procedure, Alternatives, Risks and Benefits discussed, consent obtained, from patient. Consent was obtained by the RN.  Indications  Surveillance of: Adenomatous Polyp(s).  History  Current Medications: Patient is not currently taking Coumadin.  Pre-Exam Physical: Entire physical exam was normal.  Exam Exam: Extent of exam reached: Cecum, extent intended: Cecum.  The cecum was identified by appendiceal orifice and IC valve. Colon retroflexion performed. Images were not taken. ASA Classification: II. Tolerance: good.  Monitoring: Pulse and BP monitoring, Oximetry used. Supplemental O2 given.  Colon Prep Prep results: good.  Sedation Meds: Patient assessed and found to be appropriate for moderate (conscious) sedation. Fentanyl 100 mcg. given IV. Versed 10 mg. given IV.  Findings POLYP: Transverse Colon, Maximum size: 5 mm. sessile polyp. Procedure:  snare with cautery, removed, retrieved, Polyp sent to pathology. ICD9: Colon Polyps: 211.3.  - DIVERTICULOSIS: Splenic Flexure to Sigmoid Colon. ICD9: Diverticulosis: 562.10. Comments: mod.---severe with significant stenosis of sigmoid colon.   Assessment Abnormal examination, see findings above.  Diagnoses: 211.3: Colon Polyps.  562.10: Diverticulosis.   Events  Unplanned Interventions: No intervention was required.  Unplanned Events: There were no complications. Plans Medication Plan: Await pathology. Continue current medications.  Patient Education: Patient given standard instructions for: Polyps.  Diverticulosis. Yearly hemoccult testing recommended. Patient instructed to get routine colonoscopy every 4 years.  Disposition: After procedure patient sent to recovery. After recovery patient sent home.  This report was created from the original endoscopy report, which was reviewed and signed by the above listed endoscopist.

## 2010-08-15 NOTE — Progress Notes (Signed)
Summary: change service  Phone Note Call from Patient   Caller: Patient Call For: wert Summary of Call: pt would like to change home health care service Initial call taken by: Rickard Patience,  January 11, 2010 1:30 PM  Follow-up for Phone Call        Spoke with pt's spouse.  He states that pt was "discharged" frmo [facility] once her sore resolved.  He states that [facility] contacted them and they do not this company.  Wants to stick with [facility].  Pt's spouse requests that [facility] call him for specific needs- he wants a specific nurse and other things that he states he would rather just discuss with [facility].  Please advise thanks Follow-up by: Vernie Murders,  January 11, 2010 1:36 PM  Additional Follow-up for Phone Call Additional follow up Details #1::        spoke to husband and her wants wife's homecare services moved back to [facility]  Additional Follow-up by: Oneita Jolly,  January 11, 2010 3:00 PM

## 2010-08-15 NOTE — Progress Notes (Signed)
Summary: rx  Phone Note Call from Patient Call back at Home Phone [phone]   Caller: Tomasa Hosteller Call For: [name] Reason for Call: Talk to Nurse Summary of Call: Trying to get a prescription on a yeast infection under pt's breast and stomach.  Triamci 0.1% CR sliver sulf was given to her by dermotologist and it works well.  Can you call this in?  Rx # is [id number] @ [facility] on [address]. Initial call taken by: Eugene Gavia,  March 06, 2010 3:04 PM  Follow-up for Phone Call        Spoke with pt's spouse.  He states that nurse from [facility] came out to see pt and found rash under breast and stomach.  Nurse states this is a yeast infection and rec that pt use triamcinolone cream which was originally given by derm.  Wants to know if okay for [place] to refill this.  Pls advise thanks! Follow-up by: Vernie Murders,  March 06, 2010 3:33 PM  Additional Follow-up for Phone Call Additional follow up Details #1::        yes that is fine  #1 apply two times a day for rash x 2 weeks then stop  2 refills.  Additional Follow-up by: Rubye Oaks NP,  March 06, 2010 3:36 PM    Additional Follow-up for Phone Call Additional follow up Details #2::    Spoke with pt's spouse and advised that rx was called in.  Follow-up by: Vernie Murders,  March 06, 2010 3:46 PM  New/Updated Medications: TRIAMCINOLONE ACETONIDE 0.1 % CREA (TRIAMCINOLONE ACETONIDE) apply to rash two times a day x 2 wks then stop Prescriptions: TRIAMCINOLONE ACETONIDE 0.1 % CREA (TRIAMCINOLONE ACETONIDE) apply to rash two times a day x 2 wks then stop  #1 x 2   Entered by:   Vernie Murders   Authorized by:   Rubye Oaks NP   Signed by:   Vernie Murders on 03/06/2010   Method used:   Electronically to        [facility] [phone]* (retail)       [address]       [place], [place]  [address]       Ph: [phone]       Fax: [phone]   RxID:   [id number]

## 2010-08-15 NOTE — Medication Information (Signed)
Summary: Medication communication/[facility]  Medication communication/[facility]   Imported By: Lester Peebles 01/30/2010 10:23:40  _____________________________________________________________________  External Attachment:    Type:   Image     Comment:   External Document

## 2010-08-15 NOTE — Letter (Signed)
Summary: CMN for wound care supplies/[facility]  CMN for wound care supplies/[facility]   Imported By: Sherian Rein 02/20/2010 10:33:14  _____________________________________________________________________  External Attachment:    Type:   Image     Comment:   External Document

## 2010-08-15 NOTE — Assessment & Plan Note (Signed)
Summary: Primary svc/ eval nausea and abd pain   Primary Provider/Referring Provider:  Dorthey Sawyer  CC:  Followup after shoulder surgery.  Pt c/o nausea and poor appetite since had surgery 3 wks ago.  Also c/o sore on buttocks from bandage. Marland Kitchen  History of Present Illness: 41 yowf with minimal smoking hx  with morbid obesity who has a known history of asthmatic bronchitis, hypertension, osteoarthritis and mild dementia.  ov 05/06/08 for comprehensive eval with no specific co's.  No sob or cough.  No recent flare of rhinitis symptoms.  Main co = can't you take me off some of these medications"   December 7, 2009ov  follow up and med refills. she is accompanied by her daughter. Concerned pt does not want to bathe daily - only sponge bath, does not want her bottom to get sores. Also needs BMD set up. Doing well without changes in baseline. Memory stable without significant decline.   August 02, 2008 ov: new onset moderate bandlike subacute onset generalize ha and diarrhea since 1/15  rx with phenergan starting to get better no rigors. no abd pain or dysuria. no cough, sinus co, sore throat or viz/neuro co's or sob.    08/19/2008--returns for follow up and med review. Needs refills. Feeling better w/ resolved diarrhea. Headaches- chronic but better. . --med calendar adjusted.   August 30, 2008--Presents for an acute work in visit. complains of  4 days of right low abdominal low back pain down into groin area. urinary frequecy and urgency. no dyspuria, hematuria. no fever, chest pain, bloody stools, or dyspnea. Has used pain meds w/ some relief.  . Husband and daughter w/ pt today, w/ several questions about meds. cleared for sugery.  September 23, 2008 -.  Followup after shoulder surgery.  Pt c/o nausea and poor appetite since had surgery 3 wks ago.  Also c/o sore on buttocks from bandage when it was removed left blisters.  c/o chronic abd pain and nausea x 2 years but worse since surgery and no  appetite.  not taking meds from as needed list, eg for nausea take phergan, didn't try it yet. Had steak and caulifower 4/10 pm without difficulty.  Pt denies  nasal congestion or excess secretions, fever, chills, sweats, unintended wt loss, pleuritic or exertional cp, orthopnea pnd or leg swelling.  7/910--Presents for an acute office visit. Complains of pain in right side, states goes around to the right flank area onset today.   This am was moderate, better after lunch. No urinary symptoms, Denies chest pain, dyspnea, orthopnea, hemoptysis, fever, n/v/d, edema, headache. No otc used. no food association. Has had intermittent abd pain in past , comes and goes. sometimes has gas, bloating.   June 27, 2009--Complains of sinus pressure/congestion with green/yellow mucus, PND x1week, poss UTI with slight burning and frequency x1week, rawness/redness x2days and has a red spot in outer right calf that does not itch/burn x >67month. Accompanied by husband and daughter today.  August 16, 2009 ov for surgical clearance  very sedentary, rarely goes out except to eat. last walked at a department store maybe a month ago. not doing any mentally challenging activities either. however,  denies any significant sore throat, dysphagia, itching, sneezing,  nasal congestion or excess secretions,  fever, chills, sweats, unintended wt loss, pleuritic or exertional cp, hempoptysis, change in activity tolerance  orthopnea pnd or leg swelling.  Current Medications (verified): 1)  Furosemide 20 Mg Tabs (Furosemide) .... Take 1 Tablet By Mouth Once  A Day 2)  Ogen 1.25 1.5 Mg Tabs (Estropipate) .... Take 1 Tablet By Mouth Once A Day 3)  Citrucel   Powd (Methylcellulose (Laxative)) .... Once Daily 4)  Ascriptin 325 Mg  Tabs (Aspirin Buf(Alhyd-Mghyd-Cacar)) .Marland Kitchen.. 1 Once Daily 5)  Nasonex 50 Mcg/act  Susp (Mometasone Furoate) .... Two Times A Day 6)  Amlodipine Besylate 5 Mg Tabs (Amlodipine Besylate) .... Take 1 Tablet By Mouth  Once A Day 7)  Potassium Chloride Cr 10 Meq  Cpcr (Potassium Chloride) .Marland Kitchen.. 1 Once Daily 8)  Prozac 40 Mg  Caps (Fluoxetine Hcl) .Marland Kitchen.. 1 Once Daily 9)  Multivitamins   Tabs (Multiple Vitamin) .... Once Daily 10)  Protonix 40 Mg  Pack (Pantoprazole Sodium) .Marland Kitchen.. 1tab Once Daily 11)  Clonidine Hcl 0.1 Mg  Tabs (Clonidine Hcl) .... 1/2 Two Times A Day 12)  Aricept 10 Mg Tabs (Donepezil Hcl) .... Take 1 Tablet By Mouth Once A Day 13)  Oxybutynin Chloride 5 Mg  Tabs (Oxybutynin Chloride) .Marland Kitchen.. 1 Three Times A Day 14)  Namenda 10 Mg  Tabs (Memantine Hcl) .Marland Kitchen.. 1 Two Times A Day 15)  Promethazine Hcl 25 Mg  Tabs (Promethazine Hcl) .... Every 4 Hours As Needed 16)  Hyoscyamine Sulfate 0.125 Mg  Tabs (Hyoscyamine Sulfate) .... Every 6 Hours As Needed 17)  Alprazolam 0.25 Mg  Tabs (Alprazolam) .... Every 8 Hours As Needed 18)  Triamterene-Hctz 50-25 Mg  Caps (Triamterene-Hctz) .... **chart Said 75/25mg **  Once Daily As Needed 19)  Advil 200 Mg  Tabs (Ibuprofen) .... 500mg  Two Times A Day As Needed 20)  Valtrex 1 Gm  Tabs (Valacyclovir Hcl) .... Once Dailyx 7 Days 21)  Migrazone 325-65-100 Mg Caps (Apap-Isometheptene-Dichloral) .Marland Kitchen.. 1-2 Every 4 Hr As Needed Headaches. 22)  Drixoral Cold/allergy 6-120 Mg Xr12h-Tab (Dexbrompheniramine-Pseudoeph) .... Prn 23)  Afrin Sinus 0.05 % Soln (Oxymetazoline Hcl) .... Prn 24)  Hydrocodone-Acetaminophen 5-500 Mg Tabs (Hydrocodone-Acetaminophen) .... Take 1 Tablet By Mouth Three Times A Day As Needed Severe Pain  Allergies (verified): 1)  ! Pcn  Past History:  Past Medical History: COUGH (ICD-786.2) DEPRESSION (ICD-311) ANXIETY, CHRONIC (ICD-300.00) COLONIC POLYPS (ICD-211.3) DIVERTICULITIS, COLON (ICD-562.11)    - colonoscopy 03/21/2004 HYPERLIPIDEMIA, MIXED (ICD-272.2) BARRETT'S ESOPHAGUS, HX OF (ICD-V12.79).........................[facility]   - EGD 03/21/2004 OBESITY (ICD-278.00)   - Target wt  =   < 158  for BMI < 30  UTI'S, CHRONIC  (ICD-599.0) OSTEOARTHRITIS (ICD-715.90) ASTHMATIC BRONCHITIS, ACUTE (ICD-466.0) DEMENTIA, MILD (ICD-294.8) HYPERTENSION (ICD-401.9) RHINITIS (ICD-472.0) COMPLEX MED REGIMEN ---.Meds reviewed with pt education and computerized med calendar completed/adjusted-08/19/2008  Past Surgical History: Cholecystectomy Total Hip Arthroplasty (bilat) Hysterectomy Rt rotator cuff surgery   Vital Signs:  Patient profile:   77 year old female Weight:      165 pounds BMI:     30.29 O2 Sat:      96 % on Room air Temp:     97.4 degrees F oral Pulse rate:   114 / minute BP sitting:   140 / 88  (left arm)  Vitals Entered By: Vernie Murders (October 24, 2009 11:14 AM)  O2 Flow:  Room air  Physical Exam  Additional Exam:  obese chronically ill appearing  wf walks for 4  rolling walker with hand brakes nad  wt 174 > 175 August 16, 2009 > 165 October 24, 2009  HEENT: nl dentition, turbinates, and orophanx. Nl external ear canals without cough reflex NECK :  without JVD/Nodes/TM/ nl carotid upstrokes bilaterally LUNGS: no acc muscle  use, clear to A and P bilaterally without cough on insp or exp maneuvers CV:  RRR  no s3 or murmur or increase in P2, no edema   ABD:  soft and nontender with nl excursion in the supine position. No bruits or organomegaly, bowel sounds nl MS:  warm without deformities, calf tenderness, cyanosis or clubbing SKIN: warm and dry with blisters over sacrum minimal erythema NEURO:  alert no focal deficits      Impression & Recommendations:  Problem # 1:  NAUSEA AND VOMITING (ICD-787.01)  Chronic c/o dating back years, worse since surgery ? medication side effect.  not taking prns as directed on her med calendar.   To keep things simple, I have asked the patient to first separate medicines that are perceived as maintenance, that is to be taken daily "no matter what", from those medicines that are taken on only on an as-needed basis and I have given the patient examples of  both, and then return to see our NP to generate a  detailed  medication calendar which should be followed until the next physician sees the patient and updates it.   Once we're sure that we're all reading from the same page in terms of medication admiistration, she needs to be scheduled to follow up with Dr Arlyce Dice, admit in meantime if condition not better on rx with phenergan and broth based diet   Each maintenance medication was reviewed in detail including most importantly the difference between maintenance and as needed and under what circumstances the prns are to be used. See instructions for specific recommendations   Problem # 2:  PRESSURE ULCER STAGE I (ICD-707.21)  Orders: Home Health Referral (Home Health)  Try silvadene and home nurse to dress and clean it, no role for systemic abx at this point  Problem # 3:  HYPERTENSION (ICD-401.9)  Her updated medication list for this problem includes:    Furosemide 20 Mg Tabs (Furosemide) .Marland Kitchen... Take 1 tablet by mouth once a day    Amlodipine Besylate 5 Mg Tabs (Amlodipine besylate) .Marland Kitchen... Take 1 tablet by mouth once a day    Clonidine Hcl 0.1 Mg Tabs (Clonidine hcl) .Marland Kitchen... 1/2 two times a day    Triamterene-hctz 50-25 Mg Caps (Triamterene-hctz) .Marland Kitchen... **chart said 75/25mg **  once daily as needed  Hold furosemide until takin by mouth well, will also correct her KCl  Medications Added to Medication List This Visit: 1)  Silvadene 1 % Crea (Silver sulfadiazine) .... Apply twice daily  Other Orders: TLB-BMP (Basic Metabolic Panel-BMET) (80048-METABOL) TLB-CBC Platelet - w/Differential (85025-CBCD) TLB-Hepatic/Liver Function Pnl (80076-HEPATIC) TLB-TSH (Thyroid Stimulating Hormone) (16109-UEA)  Patient Instructions: 1)  See Tammy NP w/in [facility] with all your medications, even over the counter meds, separated in two separate bags, the ones you take no matter what vs the ones you stop once you feel better and take only as needed.  She will  generate for you a new user friendly medication calendar that will put [place] all on the same page re: your medication use.  2)  See Patient Care Coordinator before leaving for arranging home health for skin care 3)  broth based diets reviewed 4)  use of as needed phenergan reviewed Prescriptions: ARICEPT 10 MG TABS (DONEPEZIL HCL) Take 1 tablet by mouth once a day  #90 x 3   Entered by:   Vernie Murders   Authorized by:   Nyoka Cowden MD   Signed by:   Vernie Murders on 10/24/2009  Method used:   Electronically to        [facility]* (mail-order)             ,          Ph: [phone]       Fax: [phone]   RxID:   [id number] SILVADENE 1 % CREA (SILVER SULFADIAZINE) apply twice daily  #1 jar x 0   Entered and Authorized by:   Nyoka Cowden MD   Signed by:   Nyoka Cowden MD on 10/24/2009   Method used:   Electronically to        [facility] [phone]* (retail)       [address]       [place], [place]  [address]       Ph: [phone]       Fax: [phone]   RxID:   [id number]

## 2010-08-15 NOTE — Progress Notes (Signed)
Summary: order request > ok to order cushion  Phone Note From Pharmacy   Caller: danielle-nurse w/ [facility] Call For: Avyay Coger  Summary of Call: pt has a stage II pressure ulcer. requests gel cushion for pt's chair. cell [phone]. fax [phone] Initial call taken by: Tivis Ringer, CNA,  February 01, 2010 8:59 AM  Follow-up for Phone Call        called spoke with [facility].  pressure ulcer is on the right buttocks and pt's chair is older without much cusion.  asked if anything was needed for patient's bed; Duwayne Heck denies this b/c patient/pt's husband can reposition.  Dr. Sherene Sires, is this order okay to send? Follow-up by: Boone Master CNA/MA,  February 01, 2010 9:06 AM  Additional Follow-up for Phone Call Additional follow up Details #1::        ok to order cushion Additional Follow-up by: Nyoka Cowden MD,  February 01, 2010 1:14 PM    Additional Follow-up for Phone Call Additional follow up Details #2::    RX printed and placed in MW's lookat to sign and then fax.Reynaldo Minium CMA  February 01, 2010 1:21 PM  ok Follow-up by: Nyoka Cowden MD,  February 01, 2010 1:24 PM  New/Updated Medications: WHEELCHAIR CUSHION  MISC (MISC. DEVICES) 1 GEL cushion for pt's wheelchair DX: stage 2 pressure ulcer Prescriptions: WHEELCHAIR CUSHION  MISC (MISC. DEVICES) 1 GEL cushion for pt's wheelchair DX: stage 2 pressure ulcer  #1 x 0   Entered by:   Reynaldo Minium CMA   Authorized by:   Nyoka Cowden MD   Signed by:   Reynaldo Minium CMA on 02/01/2010   Method used:   Print then Give to Patient   RxID:   [id number]

## 2010-08-15 NOTE — Assessment & Plan Note (Signed)
Summary: NP follow up - med calendar   Primary Provider/Referring Provider:  Dorthey Sawyer  CC:  est med calendar - pt brought all meds with her today.Marland Kitchen  History of Present Illness: 24 yowf with minimal smoking hx  with morbid obesity who has a known history of asthmatic bronchitis, hypertension, osteoarthritis and mild dementia.  7/910--Presents for an acute office visit. Complains of pain in right side, states goes around to the right flank area onset today.   This am was moderate, better after lunch. No urinary symptoms, Denies chest pain, dyspnea, orthopnea, hemoptysis, fever, n/v/d, edema, headache. No otc used. no food association. Has had intermittent abd pain in past , comes and goes. sometimes has gas, bloating.   June 27, 2009--Complains of sinus pressure/congestion with green/yellow mucus, PND x1week, poss UTI with slight burning and frequency x1week, rawness/redness x2days and has a red spot in outer right calf that does not itch/burn x >53month. Accompanied by husband and daughter today.  August 16, 2009 ov for surgical clearance  very sedentary, rarely goes out except to eat. last walked at a department store maybe a month ago. not doing any mentally challenging activities either. however,     10/24/09--CPX  October 31, 2009--Presents for 1 week follow up and med review. We reviewed meds today and updated her med calendar. She still does not feel good, w/ decreased appetite, labs were essentially unremarkable except for low K+ , we reviewed them today. She does not have abd pain, fever, Urinary symptoms. Blood sugar today was 128 in office. Her appetite is down. Has more trouble swallowing pills  for last year. We talked about taking meds w/ applesause.   Medications Prior to Update: 1)  Furosemide 20 Mg Tabs (Furosemide) .... Take 1 Tablet By Mouth Once A Day 2)  Ogen 1.25 1.5 Mg Tabs (Estropipate) .... Take 1 Tablet By Mouth Once A Day 3)  Citrucel   Powd (Methylcellulose  (Laxative)) .... Once Daily 4)  Ascriptin 325 Mg  Tabs (Aspirin Buf(Alhyd-Mghyd-Cacar)) .Marland Kitchen.. 1 Once Daily 5)  Nasonex 50 Mcg/act  Susp (Mometasone Furoate) .... Two Times A Day 6)  Amlodipine Besylate 5 Mg Tabs (Amlodipine Besylate) .... Take 1 Tablet By Mouth Once A Day 7)  Potassium Chloride Cr 10 Meq  Cpcr (Potassium Chloride) .Marland Kitchen.. 1 Once Daily 8)  Prozac 40 Mg  Caps (Fluoxetine Hcl) .Marland Kitchen.. 1 Once Daily 9)  Multivitamins   Tabs (Multiple Vitamin) .... Once Daily 10)  Protonix 40 Mg  Pack (Pantoprazole Sodium) .Marland Kitchen.. 1tab Once Daily 11)  Clonidine Hcl 0.1 Mg  Tabs (Clonidine Hcl) .... 1/2 Two Times A Day 12)  Aricept 10 Mg Tabs (Donepezil Hcl) .... Take 1 Tablet By Mouth Once A Day 13)  Oxybutynin Chloride 5 Mg  Tabs (Oxybutynin Chloride) .Marland Kitchen.. 1 Three Times A Day 14)  Namenda 10 Mg  Tabs (Memantine Hcl) .Marland Kitchen.. 1 Two Times A Day 15)  Promethazine Hcl 25 Mg  Tabs (Promethazine Hcl) .... Every 4 Hours As Needed 16)  Hyoscyamine Sulfate 0.125 Mg  Tabs (Hyoscyamine Sulfate) .... Every 6 Hours As Needed 17)  Alprazolam 0.25 Mg  Tabs (Alprazolam) .... Every 8 Hours As Needed 18)  Triamterene-Hctz 50-25 Mg  Caps (Triamterene-Hctz) .... **chart Said 75/25mg **  Once Daily As Needed 19)  Advil 200 Mg  Tabs (Ibuprofen) .... 500mg  Two Times A Day As Needed 20)  Valtrex 1 Gm  Tabs (Valacyclovir Hcl) .... Once Dailyx 7 Days 21)  Migrazone 325-65-100 Mg Caps (Apap-Isometheptene-Dichloral) .Marland KitchenMarland KitchenMarland Kitchen  1-2 Every 4 Hr As Needed Headaches. 22)  Drixoral Cold/allergy 6-120 Mg Xr12h-Tab (Dexbrompheniramine-Pseudoeph) .... Prn 23)  Afrin Sinus 0.05 % Soln (Oxymetazoline Hcl) .... Prn 24)  Hydrocodone-Acetaminophen 5-500 Mg Tabs (Hydrocodone-Acetaminophen) .... Take 1 Tablet By Mouth Three Times A Day As Needed Severe Pain 25)  Silvadene 1 % Crea (Silver Sulfadiazine) .... Apply Twice Daily  Allergies (verified): 1)  ! Pcn  Past History:  Past Medical History: Last updated: 10/24/2009 COUGH (ICD-786.2) DEPRESSION  (ICD-311) ANXIETY, CHRONIC (ICD-300.00) COLONIC POLYPS (ICD-211.3) DIVERTICULITIS, COLON (ICD-562.11)    - colonoscopy 03/21/2004 HYPERLIPIDEMIA, MIXED (ICD-272.2) BARRETT'S ESOPHAGUS, HX OF (ICD-V12.79).........................[facility]   - EGD 03/21/2004 OBESITY (ICD-278.00)   - Target wt  =   < 158  for BMI < 30  UTI'S, CHRONIC (ICD-599.0) OSTEOARTHRITIS (ICD-715.90) ASTHMATIC BRONCHITIS, ACUTE (ICD-466.0) DEMENTIA, MILD (ICD-294.8) HYPERTENSION (ICD-401.9) RHINITIS (ICD-472.0) COMPLEX MED REGIMEN ---.Meds reviewed with pt education and computerized med calendar completed/adjusted-08/19/2008  Past Surgical History: Last updated: 10/24/2009 Cholecystectomy Total Hip Arthroplasty (bilat) Hysterectomy Rt rotator cuff surgery   Family History: Last updated: 01/22/2008 Family History of Breast Cancer:Mother, daughter Rectal Cancer: Mother  Social History: Last updated: 05/06/2008 Married Occupation: Housewife Patient smoked only as teenager Alcohol Use - no Illicit Drug Use - no  Risk Factors: Smoking Status: never (01/22/2008)  Review of Systems      See HPI  Vital Signs:  Patient profile:   77 year old female Height:      62 inches Weight:      0.25 pounds BMI:     27.03 O2 Sat:      97 % on Room air Temp:     96.7 degrees F oral Pulse rate:   100 / minute BP sitting:   128 / 76  (left arm) Cuff size:   regular  Vitals Entered By: Boone Master CNA (October 31, 2009 3:09 PM)  O2 Flow:  Room air CC: est med calendar - pt brought all meds with her today. Is Patient Diabetic? No Comments Medications reviewed with patient Daytime contact number verified with patient. Boone Master CNA  October 31, 2009 3:10 PM    Physical Exam  Additional Exam:  obese chronically ill appearing  wf walks for 4  rolling walker with hand brakes nad  wt 174 > 175 August 16, 2009 > 165 October 24, 2009 ? weight today HEENT: nl dentition, turbinates, and orophanx. Nl  external ear canals without cough reflex NECK :  without JVD/Nodes/TM/ nl carotid upstrokes bilaterally LUNGS: no acc muscle use, clear to A and P bilaterally without cough on insp or exp maneuvers CV:  RRR  no s3 or murmur or increase in P2, no edema   ABD:  soft and nontender with nl excursion in the supine position. No bruits or organomegaly, bowel sounds nl MS:  warm without deformities, calf tenderness, cyanosis or clubbing NEURO:  alert no focal deficits      Impression & Recommendations:  Problem # 1:  GERD (ICD-530.81)  ? mild gastritis vs lactose intoleracnce labs were unremarkable. will check urine cx to r/o UTI.  avoid lactose products for few weeks and eat small frequent meals.  Meds reviewed with pt education and computerized med calendar completed/adjusted.    Her updated medication list for this problem includes:    Protonix 40 Mg Pack (Pantoprazole sodium) .Marland Kitchen... 1tab once daily    Hyoscyamine Sulfate 0.125 Mg Tabs (Hyoscyamine sulfate) ..... Every 6 hours as needed  Orders: Est. Patient  Level IV (96295)  Problem # 2:  HYPERTENSION (ICD-401.9) controlled  k low on labs --increase kdur to 2 tabs once daily  Her updated medication list for this problem includes:    Furosemide 20 Mg Tabs (Furosemide) .Marland Kitchen... Take 1 tablet by mouth once a day    Amlodipine Besylate 5 Mg Tabs (Amlodipine besylate) .Marland Kitchen... Take 1 tablet by mouth once a day    Clonidine Hcl 0.1 Mg Tabs (Clonidine hcl) .Marland Kitchen... 1/2 two times a day    Triamterene-hctz 50-25 Mg Caps (Triamterene-hctz) .Marland Kitchen... **chart said 75/25mg **  once daily as needed  Orders: Est. Patient Level IV (99214)  BP today: 128/76 Prior BP: 140/88 (10/24/2009)  Labs Reviewed: K+: 3.1 (10/24/2009) Creat: : 1.2 (10/24/2009)     Medications Added to Medication List This Visit: 1)  Potassium Chloride Cr 10 Meq Cpcr (Potassium chloride) .... 2  once daily  Complete Medication List: 1)  Furosemide 20 Mg Tabs (Furosemide) .... Take  1 tablet by mouth once a day 2)  Ogen 1.25 1.5 Mg Tabs (Estropipate) .... Take 1 tablet by mouth once a day 3)  Citrucel Powd (Methylcellulose (laxative)) .... Once daily 4)  Ascriptin 325 Mg Tabs (Aspirin buf(alhyd-mghyd-cacar)) .Marland Kitchen.. 1 once daily 5)  Nasonex 50 Mcg/act Susp (Mometasone furoate) .... Two times a day 6)  Amlodipine Besylate 5 Mg Tabs (Amlodipine besylate) .... Take 1 tablet by mouth once a day 7)  Potassium Chloride Cr 10 Meq Cpcr (Potassium chloride) .... 2  once daily 8)  Prozac 40 Mg Caps (Fluoxetine hcl) .Marland Kitchen.. 1 once daily 9)  Multivitamins Tabs (Multiple vitamin) .... Once daily 10)  Protonix 40 Mg Pack (Pantoprazole sodium) .Marland Kitchen.. 1tab once daily 11)  Clonidine Hcl 0.1 Mg Tabs (Clonidine hcl) .... 1/2 two times a day 12)  Aricept 10 Mg Tabs (Donepezil hcl) .... Take 1 tablet by mouth once a day 13)  Oxybutynin Chloride 5 Mg Tabs (Oxybutynin chloride) .Marland Kitchen.. 1 three times a day 14)  Namenda 10 Mg Tabs (Memantine hcl) .Marland Kitchen.. 1 two times a day 15)  Promethazine Hcl 25 Mg Tabs (Promethazine hcl) .... Every 4 hours as needed 16)  Hyoscyamine Sulfate 0.125 Mg Tabs (Hyoscyamine sulfate) .... Every 6 hours as needed 17)  Alprazolam 0.25 Mg Tabs (Alprazolam) .... Every 8 hours as needed 18)  Triamterene-hctz 50-25 Mg Caps (Triamterene-hctz) .... **chart said 75/25mg **  once daily as needed 19)  Advil 200 Mg Tabs (Ibuprofen) .... 500mg  two times a day as needed 20)  Valtrex 1 Gm Tabs (Valacyclovir hcl) .... Once dailyx 7 days 21)  Migrazone 325-65-100 Mg Caps (Apap-isometheptene-dichloral) .Marland Kitchen.. 1-2 every 4 hr as needed headaches. 22)  Drixoral Cold/allergy 6-120 Mg Xr12h-tab (Dexbrompheniramine-pseudoeph) .... Prn 23)  Afrin Sinus 0.05 % Soln (Oxymetazoline hcl) .... Prn 24)  Hydrocodone-acetaminophen 5-500 Mg Tabs (Hydrocodone-acetaminophen) .... Take 1 tablet by mouth three times a day as needed severe pain 25)  Silvadene 1 % Crea (Silver sulfadiazine) .... Apply twice daily  Other  Orders: TLB-Udip w/ Micro (81001-URINE) T-Urine Culture (Spectrum Order) ([id number])  Patient Instructions: 1)  I will call with labs 2)  Increase Potassium 2 tabs once daily  3)  Follow med calendar and bring to each visit.  4)  follow up Dr. Sherene Sires in 6-8 weeks Dr. Sherene Sires  5)  Small frequent meals, increase fluids.  6)  Please contact office for sooner follow up if symptoms do not improve or worsen  Prescriptions: POTASSIUM CHLORIDE CR 10 MEQ  CPCR (POTASSIUM CHLORIDE) 2  once daily  #60 x 11   Entered and Authorized by:   Rubye Oaks NP   Signed by:   Tammy Parrett NP on 10/31/2009   Method used:   Electronically to        [id number]* (retail)       [address]       [place], [place]  [address]       Ph: [phone]       Fax: [phone]   RxID:   [id number]   Appended Document: med calendar update Medications Added CITRUCEL   POWD (METHYLCELLULOSE (LAXATIVE)) 1 tsp once daily NASONEX 50 MCG/ACT  SUSP (MOMETASONE FUROATE) 1-2 puffs each nostril two times a day PROMETHAZINE HCL 25 MG  TABS (PROMETHAZINE HCL) 1 tab by mouth every 6 hours as needed HYOSCYAMINE SULFATE 0.125 MG  TABS (HYOSCYAMINE SULFATE) 1-2 every 6 hours as needed ALPRAZOLAM 0.25 MG  TABS (ALPRAZOLAM) 1 every 8 hours as needed MIGRAZONE 325-65-100 MG CAPS (APAP-ISOMETHEPTENE-DICHLORAL) 2 at onset then 1 every 4 hours as needed ADVIL 200 MG  TABS (IBUPROFEN) 3 tabs w/ meal two times a day as needed HYDROCODONE-ACETAMINOPHEN 5-500 MG TABS (HYDROCODONE-ACETAMINOPHEN) 1 tab every 6 hours as needed VALTREX 1 GM  TABS (VALACYCLOVIR HCL) once dailyx 7 days as needed DRIXORAL COLD/ALLERGY 6-120 MG XR12H-TAB (DEXBROMPHENIRAMINE-PSEUDOEPH) Take 1 tablet by mouth once a day as needed AFRIN SINUS 0.05 % SOLN (OXYMETAZOLINE HCL) two times a day x5days before nasonex          Clinical Lists Changes  Medications: Changed medication from CITRUCEL   POWD  (METHYLCELLULOSE (LAXATIVE)) once daily to CITRUCEL   POWD (METHYLCELLULOSE (LAXATIVE)) 1 tsp once daily - Signed Changed medication from NASONEX 50 MCG/ACT  SUSP (MOMETASONE FUROATE) two times a day to NASONEX 50 MCG/ACT  SUSP (MOMETASONE FUROATE) 1-2 puffs each nostril two times a day - Signed Changed medication from PROMETHAZINE HCL 25 MG  TABS (PROMETHAZINE HCL) every 4 hours as needed to PROMETHAZINE HCL 25 MG  TABS (PROMETHAZINE HCL) 1 tab by mouth every 6 hours as needed - Signed Changed medication from HYOSCYAMINE SULFATE 0.125 MG  TABS (HYOSCYAMINE SULFATE) every 6 hours as needed to HYOSCYAMINE SULFATE 0.125 MG  TABS (HYOSCYAMINE SULFATE) 1-2 every 6 hours as needed - Signed Changed medication from ALPRAZOLAM 0.25 MG  TABS (ALPRAZOLAM) every 8 hours as needed to ALPRAZOLAM 0.25 MG  TABS (ALPRAZOLAM) 1 every 8 hours as needed - Signed Changed medication from MIGRAZONE 325-65-100 MG CAPS (APAP-ISOMETHEPTENE-DICHLORAL) 1-2 every 4 hr as needed headaches. to MIGRAZONE 325-65-100 MG CAPS (APAP-ISOMETHEPTENE-DICHLORAL) 2 at onset then 1 every 4 hours as needed Changed medication from ADVIL 200 MG  TABS (IBUPROFEN) 500mg  two times a day as needed to ADVIL 200 MG  TABS (IBUPROFEN) 3 tabs w/ meal two times a day as needed Changed medication from HYDROCODONE-ACETAMINOPHEN 5-500 MG TABS (HYDROCODONE-ACETAMINOPHEN) Take 1 tablet by mouth three times a day as needed severe pain to HYDROCODONE-ACETAMINOPHEN 5-500 MG TABS (HYDROCODONE-ACETAMINOPHEN) 1 tab every 6 hours as needed Changed medication from VALTREX 1 GM  TABS (VALACYCLOVIR HCL) once dailyx 7 days to VALTREX 1 GM  TABS (VALACYCLOVIR HCL) once dailyx 7 days as needed Changed medication from [facility] COLD/ALLERGY 6-120 MG XR12H-TAB (DEXBROMPHENIRAMINE-PSEUDOEPH) prn to DRIXORAL COLD/ALLERGY 6-120 MG XR12H-TAB (DEXBROMPHENIRAMINE-PSEUDOEPH) Take 1 tablet by mouth once a day as needed Changed medication from [facility] SINUS 0.05 % SOLN (OXYMETAZOLINE HCL)  prn to AFRIN SINUS 0.05 %  SOLN (OXYMETAZOLINE HCL) two times a day x5days before nasonex Removed medication of SILVADENE 1 % CREA (SILVER SULFADIAZINE) apply twice daily

## 2010-08-15 NOTE — Progress Notes (Signed)
Summary: talk to nurse ok for pt/ot  Phone Note From Other Clinic Call back at [phone]   Caller: Nurse/kristen/[facility] Call For: wert Request: Talk with Nurse Summary of Call: Need order for PT an OT to evaluate and treat, also need order for health aide 2x week for adl assistance. Initial call taken by: Darletta Moll,  October 26, 2009 1:59 PM  Follow-up for Phone Call        Please advise if okay for order.Michel Bickers [facility]  October 26, 2009 4:11 PM ok with me Follow-up by: Nyoka Cowden MD,  October 26, 2009 4:35 PM  Additional Follow-up for Phone Call Additional follow up Details #1::        order sent to [facility]. Carron Curie CMA  October 26, 2009 4:39 PM

## 2010-08-15 NOTE — Progress Notes (Signed)
Summary: med didnt go thru  Phone Note Call from Patient Call back at [facility] Phone [phone]   Caller: dan-spouse Call For: wert Summary of Call: rx was sent to [facility] at last visit but they didnt receive it needs this taken care of Initial call taken by: Lacinda Axon,  May 15, 2010 1:39 PM  Follow-up for Phone Call        called and spoke with pt and she is aware that the klor con was sent to [facility] on [address] but pt needs [facility]---rx has been sent to [facility] and pt is aware Randell Loop CMA  May 15, 2010 2:40 PM     Prescriptions: KLOR-CON M20 20 MEQ CR-TABS (POTASSIUM CHLORIDE CRYS CR) 2 tabs by mouth  every morning  #180 x 3   Entered by:   Randell Loop CMA   Authorized by:   Nyoka Cowden MD   Signed by:   Randell Loop CMA on 05/15/2010   Method used:   Faxed to ...       [facility]* (retail)             ,          Ph: [phone]       Fax: [phone]   RxID:   [id number]

## 2010-08-15 NOTE — Miscellaneous (Signed)
Summary: Order/[facility]  Order/[facility]   Imported By: Sherian Rein 03/07/2010 11:04:17  _____________________________________________________________________  External Attachment:    Type:   Image     Comment:   External Document

## 2010-08-15 NOTE — Letter (Signed)
Summary: EGD Instructions  [facility]  [address] [place], [place] [address]   Phone: [phone]  Fax: [phone]       [name]    1931/01/06    MRN: [id number]       Procedure Day /Date:MONDAY 06/19/2010     Arrival Time: 2PM     Procedure Time:3PM     Location of Procedure:                    X  [facility] (4th Floor)  PREPARATION FOR ENDOSCOPY/DIL   On 06/19/2010 THE DAY OF THE PROCEDURE:  1.   No solid foods, milk or milk products are allowed after midnight the night before your procedure.  2.   Do not drink anything colored red or purple.  Avoid juices with pulp.  No orange juice.  3.  You may drink clear liquids until1PM, which is 2 hours before your procedure.                                                                                                CLEAR LIQUIDS INCLUDE: Water Jello Ice Popsicles Tea (sugar ok, no milk/cream) Powdered fruit flavored drinks Coffee (sugar ok, no milk/cream) Gatorade Juice: apple, white grape, white cranberry  Lemonade Clear bullion, consomm, broth Carbonated beverages (any kind) Strained chicken noodle soup Hard Candy   MEDICATION INSTRUCTIONS  Unless otherwise instructed, you should take regular prescription medications with a small sip of water as early as possible the morning of your procedure.           OTHER INSTRUCTIONS  You will need a responsible adult at least 75 years of age to accompany you and drive you home.   This person must remain in the waiting room during your procedure.  Wear loose fitting clothing that is easily removed.  Leave jewelry and other valuables at home.  However, you may wish to bring a book to read or an iPod/MP3 player to listen to music as you wait for your procedure to start.  Remove all body piercing jewelry and leave at home.  Total time from sign-in until discharge is approximately 2-3 hours.  You should go home directly after your  procedure and rest.  You can resume normal activities the day after your procedure.  The day of your procedure you should not:   Drive   Make legal decisions   Operate machinery   Drink alcohol   Return to work  You will receive specific instructions about eating, activities and medications before you leave.    The above instructions have been reviewed and explained to me by   _______________________    I fully understand and can verbalize these instructions _____________________________ Date _________

## 2010-08-15 NOTE — Progress Notes (Signed)
Summary: RETURNED CALL  Phone Note Call from Patient Call back at [facility] Phone [phone]   Caller: Patient Call For: Wert/TP Summary of Call: PT RETURNED CALL RE: RESULTS.  Initial call taken by: Tivis Ringer, CNA,  February 09, 2010 12:51 PM  Follow-up for Phone Call        Spoke with pt's spouse and notified of results per TP.  He states that TP was supposed to speak with MW about giving pt med for pain, he does not remember the name of this med.  Pls advise, thanks! Follow-up by: Vernie Murders,  February 09, 2010 1:36 PM  Additional Follow-up for Phone Call Additional follow up Details #1::        can have refill if that is what they need.  Additional Follow-up by: Rubye Oaks NP,  February 09, 2010 2:24 PM    Additional Follow-up for Phone Call Additional follow up Details #2::    Spoke with pt's spouse.  He states that the refill needed is for hydrocodone apap. Refill for this was called to the [facility] with no additional refills. Follow-up by: Vernie Murders,  February 09, 2010 3:15 PM  Prescriptions: HYDROCODONE-ACETAMINOPHEN 5-500 MG TABS (HYDROCODONE-ACETAMINOPHEN) 1 tab every 6 hours as needed  #50 x 0   Entered by:   Vernie Murders   Authorized by:   Rubye Oaks NP   Signed by:   Vernie Murders on 02/09/2010   Method used:   Telephoned to ...       [facility]  [address] [phone]* (retail)       [address]       [place], [place]  [address]       Ph: [phone]       Fax: [phone]   RxID:   [id number]

## 2010-08-15 NOTE — Progress Notes (Signed)
Summary: rx corrected - appt w/ TP 8.26.11  Phone Note Call from Patient Call back at Home Phone [phone]   Caller: Spouse-Dan Call For: wert Reason for Call: Talk to Nurse Summary of Call: pt's husband called back and said creme was still incorrect.  Refer to RX # [id number] for the Triamcinolone Acetonide 0.1% and Silvadene 1% creme that should be mixed together. [facility] [address] Initial call taken by: Eugene Gavia,  March 09, 2010 3:01 PM  Follow-up for Phone Call        Spoke with pt's spouse. He states that instead of plain triamcinolone cream, pt needs the combination cream with silvadine cream and triamcionolone.  Pls advise thanks Follow-up by: Vernie Murders,  March 09, 2010 3:06 PM  Additional Follow-up for Phone Call Additional follow up Details #1::        Spoke with TP regarding this msg and will forward to her per her request. Vernie Murders  March 09, 2010 3:25 PM     Additional Follow-up for Phone Call Additional follow up Details #2::    pharm does not mix combo  suggest using both if dermatology rec in past.  if not improving , Please contact office for sooner follow up if symptoms do not improve or worsen  LMTCB Follow-up by: Rubye Oaks NP,  March 09, 2010 3:45 PM  Additional Follow-up for Phone Call Additional follow up Details #3:: Details for Additional Follow-up Action Taken: Jesusita Oka returned TP's call, informed him that the pharmacy will not mix the 2 creams but that they may use them both and mix at home.  Jesusita Oka stated that the pharmacy has done this for them before, but verbalized his understanding that it will not be done now.  Dan requests appt w/ TP for the yeast infection under pt's breasts.  appt made with TP tomorrow afternoon 8.26.11 @ 1545.  Jesusita Oka okay with this appt date and time. Additional Follow-up by: Boone Master CNA/MA,  March 09, 2010 5:36 PM

## 2010-08-15 NOTE — Progress Notes (Signed)
Summary: clarification of med dosage  Phone Note Call from Patient Call back at Home Phone [phone]   Caller: Spouse//dan Call For: wert Summary of Call: Needs to verify the dosage for pt's potassium chloride. Initial call taken by: Darletta Moll,  May 25, 2010 1:55 PM  Follow-up for Phone Call        [facility]- should be 20 meq every am Vernie Murders  May 25, 2010 2:13 PM   Additional Follow-up for Phone Call Additional follow up Details #1::        Spoek with pt's spouse.  He is cnofused about why K was increased to 20 meq 2 every am.  I advised that this is what we had changed her to at last ov and advised that he look at the med cal to see if change is on there.  He states that on med cal by k-dur, it says 10 meq once daily, and the 1 is crossed out and 2 written in its place, so therefore he thinks she should be on only 10 meq 2 once daily.  Pls advise thanks! Additional Follow-up by: Vernie Murders,  May 25, 2010 4:49 PM    Additional Follow-up for Phone Call Additional follow up Details #2::    the total dose should be 20 meq per day (if the bottle says its 10, then it's two, if it says it's 20, then it's one. next ov needs to see Tammy instead of me and work it out so all the instructions match the pills 100% Follow-up by: Nyoka Cowden MD,  May 25, 2010 4:51 PM  Additional Follow-up for Phone Call Additional follow up Details #3:: Details for Additional Follow-up Action Taken: pt husband advised and pt set to see TP on 06-27-11 at 2pm. Carron Curie CMA  May 25, 2010 5:04 PM

## 2010-08-15 NOTE — Miscellaneous (Signed)
Summary: Medication orders/[facility]  Medication orders/[facility]   Imported By: Sherian Rein 02/03/2010 10:34:16  _____________________________________________________________________  External Attachment:    Type:   Image     Comment:   External Document

## 2010-08-15 NOTE — Procedures (Signed)
Summary: Upper Endoscopy  Patient: [name] Note: All result statuses are Final unless otherwise noted.  Tests: (1) Upper Endoscopy (EGD)   EGD Upper Endoscopy       DONE     [facility]     [address]     [place], [place]  [address]           ENDOSCOPY PROCEDURE REPORT           PATIENT:  Cristina, [name]  MR#:  [id number]     BIRTHDATE:  1930-09-08, 79 yrs. old  GENDER:  female           ENDOSCOPIST:  Barbette Hair. Arlyce Dice, MD     Referred by:           PROCEDURE DATE:  06/19/2010     PROCEDURE:  EGD, diagnostic 43235, Maloney Dilation of Esophagus     ASA CLASS:  Class II     INDICATIONS:  dysphagia           MEDICATIONS:   Fentanyl 25 mcg IV, Versed 3 mg IV, glycopyrrolate     (Robinal) 0.2 mg IV, 0.6cc simethancone 0.6 cc PO     TOPICAL ANESTHETIC:  Exactacain Spray           DESCRIPTION OF PROCEDURE:   After the risks benefits and     alternatives of the procedure were thoroughly explained, informed     consent was obtained.  The [facility] GIF-H180 [id number] endoscope was     introduced through the mouth and advanced to the third portion of     the duodenum, without limitations.  The instrument was slowly     withdrawn as the mucosa was fully examined.     <<PROCEDUREIMAGES>>           A stricture was found at the gastroesophageal junction (see     image2). Questionable early esophageal stricture Dilation with     maloney dilator 18mm Minimal resistance; no heme  Otherwise the     examination was normal. No mucosal irregularities to suggest     Barrett's esophagus    Retroflexed views revealed no     abnormalities.    The scope was then withdrawn from the patient     and the procedure completed.           COMPLICATIONS:  None           ENDOSCOPIC IMPRESSION:     1) Stricture at the gastroesophageal junction - s/p maloney     dilitation     2) Otherwise normal examination     RECOMMENDATIONS:     1) dilatations PRN           REPEAT EXAM:  No        ______________________________     Barbette Hair. Arlyce Dice, MD           CC:  Nyoka Cowden, MD           n.     Rosalie DoctorBarbette Hair. Najee Manninen at 06/19/2010 03:39 PM           Loel Ro, [phone]  Note: An exclamation mark (!) indicates a result that was not dispersed into the flowsheet. Document Creation Date: 06/19/2010 3:39 PM _______________________________________________________________________  (1) Order result status: Final Collection or observation date-time: 06/19/2010 15:25 Requested date-time:  Receipt date-time:  Reported date-time:  Referring Physician:   Ordering Physician: Melvia Heaps [phone]) Specimen Source:  Source: Launa Grill  Order Number: [id number] Lab site:

## 2010-08-15 NOTE — Progress Notes (Signed)
Summary: Home nursing  Phone Note Call from Patient Call back at [phone]   Caller: [facility] Call For: wert Reason for Call: Talk to Nurse Summary of Call: Will be seeing her w/skilled nursing and home health aid 2 times a week. Initial call taken by: Eugene Gavia,  January 17, 2010 4:07 PM  Follow-up for Phone Call        ok with me if qualifies Follow-up by: Nyoka Cowden MD,  January 17, 2010 4:47 PM  Additional Follow-up for Phone Call Additional follow up Details #1::        called spoke with angela @ [facility] who states that laura is "out in the field."  asked if anything needs to be done by our office for the message that was left.  per angela, the verbal given by myself per MW "is enough."  will sign off on message. Additional Follow-up by: Boone Master CNA/MA,  January 17, 2010 4:57 PM

## 2010-08-15 NOTE — Progress Notes (Signed)
Summary: Ok to extend therapy  Phone Note From Other Clinic   Caller: danielle//[facility] Call For: wert Summary of Call: Needs an order to extend pt's therapy. Initial call taken by: Darletta Moll,  February 10, 2010 3:48 PM  Follow-up for Phone Call        Dr Sherene Sires, would this be okay? Pls advise thanks! Vernie Murders  February 10, 2010 3:52 PM ok  Follow-up by: Nyoka Cowden MD,  February 10, 2010 6:23 PM  Additional Follow-up for Phone Call Additional follow up Details #1::        Genevieve Norlander @ [phone] - Tamala Ser is pt's physical therapist.  Clinical Manager is Joyce Gross.  called [facility], spoke with Joyce Gross.  gave verbal order to extend PT her MW.  Joyce Gross verbalized her understanding.  Additional Follow-up by: Boone Master CNA/MA,  February 13, 2010 9:29 AM

## 2010-08-15 NOTE — Assessment & Plan Note (Signed)
Summary: F/U OV...LSW.    History of Present Illness Visit Type: Follow-up Visit Primary GI MD: Melvia Heaps MD [facility] Primary Provider: Sandrea Hughs, MD Requesting Provider: na Chief Complaint: Lower abd pain, GERD, and dysphagia  History of Present Illness:   Mrs. [name] has returned for evaluation of abdominal pain and dysphagia.  She is a poor historian and suffers from dementia.  According to her husband  she she has had episodes of choking and is complaining of dysphagia to solids.  She has a history of Barrett's esophagus. She also complains of poorly described upper abdominal discomfort that seems to be worse postprandially.  She takes Protonix daily.  She has a history of colon polyps and was last examined 2005.   GI Review of Systems    Reports abdominal pain, acid reflux, and  heartburn.     Location of  Abdominal pain: lower abdomen.    Denies belching, bloating, chest pain, dysphagia with liquids, dysphagia with solids, loss of appetite, nausea, vomiting, vomiting blood, weight loss, and  weight gain.        Denies anal fissure, black tarry stools, change in bowel habit, constipation, diarrhea, diverticulosis, fecal incontinence, heme positive stool, hemorrhoids, irritable bowel syndrome, jaundice, light color stool, liver problems, rectal bleeding, and  rectal pain.    Current Medications (verified): 1)  Ogen 1.25 1.5 Mg Tabs (Estropipate) .... Take 1 Tablet By Mouth Once A Day 2)  Citrucel   Powd (Methylcellulose (Laxative)) .Marland Kitchen.. 1 Tsp Once Daily 3)  Ascriptin 325 Mg  Tabs (Aspirin Buf(Alhyd-Mghyd-Cacar)) .Marland Kitchen.. 1 Once Daily 4)  Nasonex 50 Mcg/act  Susp (Mometasone Furoate) .Marland Kitchen.. 1-2 Puffs Each Nostril Two Times A Day 5)  Amlodipine Besylate 5 Mg Tabs (Amlodipine Besylate) .... Take 1 Tablet By Mouth Once A Day 6)  Klor-Con M20 20 Meq Cr-Tabs (Potassium Chloride Crys Cr) .... 2 Tabs By Mouth  Every Morning 7)  Prozac 40 Mg  Caps (Fluoxetine Hcl) .Marland Kitchen.. 1 Once Daily 8)   Multivitamins   Tabs (Multiple Vitamin) .... Once Daily 9)  Protonix 40 Mg  Pack (Pantoprazole Sodium) .Marland Kitchen.. 1tab Once Daily 10)  Clonidine Hcl 0.1 Mg  Tabs (Clonidine Hcl) .... 1/2 Two Times A Day 11)  Aricept 10 Mg Tabs (Donepezil Hcl) .... Take 1 Tablet By Mouth Once A Day 12)  Oxybutynin Chloride 5 Mg  Tabs (Oxybutynin Chloride) .Marland Kitchen.. 1 Three Times A Day 13)  Namenda 10 Mg  Tabs (Memantine Hcl) .Marland Kitchen.. 1 Two Times A Day 14)  Promethazine Hcl 25 Mg  Tabs (Promethazine Hcl) .Marland Kitchen.. 1 Tab By Mouth Every 6 Hours As Needed 15)  Hyoscyamine Sulfate 0.125 Mg  Tabs (Hyoscyamine Sulfate) .Marland Kitchen.. 1-2 Every 6 Hours As Needed 16)  Alprazolam 0.25 Mg  Tabs (Alprazolam) .Marland Kitchen.. 1 Every 8 Hours As Needed 17)  Triamterene-Hctz 50-25 Mg  Caps (Triamterene-Hctz) .... **chart Said 75/25mg **  Once Daily As Needed 18)  Migrazone 325-65-100 Mg Caps (Apap-Isometheptene-Dichloral) .... 2 At Onset Then 1 Every 4 Hours As Needed 19)  Advil 200 Mg  Tabs (Ibuprofen) .... 3 Tabs W/ Meal Two Times A Day As Needed 20)  Hydrocodone-Acetaminophen 5-500 Mg Tabs (Hydrocodone-Acetaminophen) .Marland Kitchen.. 1 Tab Every 6 Hours As Needed 21)  Valtrex 1 Gm  Tabs (Valacyclovir Hcl) .... Once Dailyx 7 Days As Needed 22)  Drixoral Cold/allergy 6-120 Mg Xr12h-Tab (Dexbrompheniramine-Pseudoeph) .... Take 1 Tablet By Mouth Once A Day As Needed 23)  Afrin Sinus 0.05 % Soln (Oxymetazoline Hcl) .... Two Times A Day X5days Before  Nasonex 24)  Pepcid Ac Maximum Strength 20 Mg Tabs (Famotidine) .... One At Bedtime 25)  Silvadene 1 % Crea (Silver Sulfadiazine) .... Apply Sparingly To Affected Area Two Times A Day 26)  Clotrimazole-Betamethasone 1-0.05 % Crea (Clotrimazole-Betamethasone) .... Apply To Area Two Times A Day As Needed Rash Under Breast and Groin.  Allergies (verified): 1)  ! Pcn  Past History:  Past Medical History: Reviewed history from 05/02/2010 and no changes required. COUGH (ICD-786.2) DEPRESSION (ICD-311) ANXIETY, CHRONIC  (ICD-300.00) COLONIC POLYPS (ICD-211.3) DIVERTICULITIS, COLON (ICD-562.11)    - colonoscopy 03/21/2004 HYPERLIPIDEMIA, MIXED (ICD-272.2) BARRETT'S ESOPHAGUS, HX OF (ICD-V12.79)..............................[facility]   - EGD 03/21/2004 OBESITY (ICD-278.00)   - Target wt  =   < 158  for BMI < 30  UTI'S, CHRONIC (ICD-599.0)..........................................................Marland KitchenMarland KitchenPeterson OSTEOARTHRITIS (ICD-715.90) ASTHMATIC BRONCHITIS, ACUTE (ICD-466.0) DEMENTIA, MILD (ICD-294.8) HYPERTENSION (ICD-401.9) RHINITIS (ICD-472.0) Sacral Decubitus...........................................................................[facility]land KitchenDermatology-- [facility] [place] [facility]  Skin Candida--breast/groin --tx w/ diflucan 8/26, rx lotrisone COMPLEX MED REGIMEN ---.Meds reviewed with pt education and computerized med calendar completed/adjusted-08/19/2008  Past Surgical History: Cholecystectomy Total Hip Arthroplasty (bilat) Hysterectomy Rt rotator cuff surgery   Family History: Family History of Breast Cancer:Mother, Daughter Rectal/Colon Cancer: Mother   Social History: Occupation: Housewife Married Patient smoked only as teenager Alcohol Use - no Illicit Drug Use - no  Review of Systems       The patient complains of arthritis/joint pain and confusion.  The patient denies allergy/sinus, anemia, anxiety-new, back pain, blood in urine, breast changes/lumps, change in vision, cough, coughing up blood, depression-new, fainting, fatigue, fever, headaches-new, hearing problems, heart murmur, heart rhythm changes, itching, menstrual pain, muscle pains/cramps, night sweats, nosebleeds, pregnancy symptoms, shortness of breath, skin rash, sleeping problems, sore throat, swelling of feet/legs, swollen lymph glands, thirst - excessive , urination - excessive , urination changes/pain, urine leakage, vision changes, and voice change.    Vital Signs:  Patient profile:   77 year old  female Height:      62 inches Weight:      155 pounds BMI:     28.45 BSA:     1.72 Pulse rate:   88 / minute Pulse rhythm:   regular BP sitting:   122 / 74  (left arm) Cuff size:   regular  Vitals Entered By: Ok Anis CMA (May 22, 2010 1:26 PM)  Physical Exam  Additional Exam:  She is an elderly female  skin: anicteric HEENT: normocephalic; PEERLA; no nasal or pharyngeal abnormalities neck: supple nodes: no cervical lymphadenopathy chest: clear to ausculatation and percussion heart: no  gallops, or rubs; there is 1-2/6 holosystolic murmur abd: soft, nontender; BS normoactive; no abdominal masses, tenderness, organomegaly rectal: deferred ext: no cynanosis, clubbing, edema skeletal: no deformities neuro: oriented x 3; no focal abnormalities    Impression & Recommendations:  Problem # 1:  DYSPHAGIA UNSPECIFIED (ICD-787.20)  rule out esophageal stricture, especially in view of her history of Barrett's esophagus.  Recommendations #1 upper endoscopy with dilatation as indicated  Orders: EGD (EGD)  Problem # 2:  BARRETT'S ESOPHAGUS, HX OF (ICD-V12.79) Plan followup endoscopy  Problem # 3:  COLONIC POLYPS (ICD-211.3) In viesw of  the patient's overall medical status including dementia I will defer this exam  Patient Instructions: 1)  Copy sent to : Sandrea Hughs, MD 2)  Your EGD is scheduled for 06/19/2010 at 3pm 3)  The medication list was reviewed and reconciled.  All changed / newly prescribed medications were explained.  A complete medication list was provided  to the patient / caregiver.

## 2010-08-15 NOTE — Assessment & Plan Note (Signed)
Summary: Acute NP office visit - yeast infection   Primary Provider/Referring Provider:  Dorthey Sawyer  CC:  yeast infection under both breasts and both sides of groin area x75months - red, painful, and itching.  History of Present Illness: 77 yowf with minimal smoking hx  with morbid obesity who has a known history of asthmatic bronchitis, hypertension, osteoarthritis and mild dementia.  7/910--Presents for an acute office visit. Complains of pain in right side, states goes around to the right flank area onset today.   This am was moderate, better after lunch. No urinary symptoms, Denies chest pain, dyspnea, orthopnea, hemoptysis, fever, n/v/d, edema, headache. No otc used. no food association. Has had intermittent abd pain in past , comes and goes. sometimes has gas, bloating.   June 27, 2009--Complains of sinus pressure/congestion with green/yellow mucus, PND x1week, poss UTI with slight burning and frequency x1week, rawness/redness x75days and has a red spot in outer right calf that does not itch/burn x >75month. Accompanied by husband and daughter today.  August 16, 2009 ov for surgical clearance  very sedentary, rarely goes out except to eat. last walked at a department store maybe a month ago. not doing any mentally challenging activities either. however,     10/24/09--CPX  October 31, 2009--Presents for 1 week follow up and med review. We reviewed meds today and updated her med calendar. She still does not feel good, w/ decreased appetite, labs were essentially unremarkable except for low K+   January 04, 2010 ov c/o decrease in appetite over the past month.  She also c/o some occ difficulty with swallowing.  Has gerd per GI (Dr Doreatha Martin) denies non adherence with rx as outlined in med calendar and inventoried accurately under present meds.  sore on sacrum no better, using donut to sit.  Urgency also but no dysuria.   --stopped lasix, pepcid added, decreased k-dur.     February 07, 2010 -returns  for follow up 4 week follow up - states appetite and swallowing have improved. Last visit Pepcid added. She is going to wound care center in [facility] for sacral decub. SHe has quite abit of pain from this. Area is excoriated. and sensitive. She is taking her meds well. Husband is here with her today, he helps her with daily care.   March 14, 2010--Presents for yeast infection under both breasts and both sides of groin area x75months - red, painful, itching. Has used several creams but not going away. Has been prone to this in past. No fever, drainage or blisters. Denies chest pain, dyspnea, orthopnea, hemoptysis, fever, n/v/d, edema, headache.     Medications Prior to Update: 1)  Ogen 1.25 1.5 Mg Tabs (Estropipate) .... Take 1 Tablet By Mouth Once A Day 2)  Citrucel   Powd (Methylcellulose (Laxative)) .Marland Kitchen.. 1 Tsp Once Daily 3)  Ascriptin 325 Mg  Tabs (Aspirin Buf(Alhyd-Mghyd-Cacar)) .Marland Kitchen.. 1 Once Daily 4)  Nasonex 50 Mcg/act  Susp (Mometasone Furoate) .Marland Kitchen.. 1-2 Puffs Each Nostril Two Times A Day 5)  Amlodipine Besylate 5 Mg Tabs (Amlodipine Besylate) .... Take 1 Tablet By Mouth Once A Day 6)  Klor-Con M20 20 Meq Cr-Tabs (Potassium Chloride Crys Cr) .... 2 Tabs By Mouth  Every Morning 7)  Prozac 40 Mg  Caps (Fluoxetine Hcl) .Marland Kitchen.. 1 Once Daily 8)  Multivitamins   Tabs (Multiple Vitamin) .... Once Daily 9)  Protonix 40 Mg  Pack (Pantoprazole Sodium) .Marland Kitchen.. 1tab Once Daily 10)  Clonidine Hcl 0.1 Mg  Tabs (Clonidine Hcl) .Marland KitchenMarland KitchenMarland Kitchen  1/2 Two Times A Day 11)  Aricept 10 Mg Tabs (Donepezil Hcl) .... Take 1 Tablet By Mouth Once A Day 12)  Oxybutynin Chloride 5 Mg  Tabs (Oxybutynin Chloride) .Marland Kitchen.. 1 Three Times A Day 13)  Namenda 10 Mg  Tabs (Memantine Hcl) .Marland Kitchen.. 1 Two Times A Day 14)  Promethazine Hcl 25 Mg  Tabs (Promethazine Hcl) .Marland Kitchen.. 1 Tab By Mouth Every 6 Hours As Needed 15)  Hyoscyamine Sulfate 0.125 Mg  Tabs (Hyoscyamine Sulfate) .Marland Kitchen.. 1-2 Every 6 Hours As Needed 16)  Alprazolam 0.25 Mg  Tabs (Alprazolam) .Marland Kitchen.. 1  Every 8 Hours As Needed 17)  Triamterene-Hctz 50-25 Mg  Caps (Triamterene-Hctz) .... **chart Said 75/25mg **  Once Daily As Needed 18)  Migrazone 325-65-100 Mg Caps (Apap-Isometheptene-Dichloral) .... 2 At Onset Then 1 Every 4 Hours As Needed 19)  Advil 200 Mg  Tabs (Ibuprofen) .... 3 Tabs W/ Meal Two Times A Day As Needed 20)  Hydrocodone-Acetaminophen 5-500 Mg Tabs (Hydrocodone-Acetaminophen) .Marland Kitchen.. 1 Tab Every 6 Hours As Needed 21)  Valtrex 1 Gm  Tabs (Valacyclovir Hcl) .... Once Dailyx 7 Days As Needed 22)  Drixoral Cold/allergy 6-120 Mg Xr12h-Tab (Dexbrompheniramine-Pseudoeph) .... Take 1 Tablet By Mouth Once A Day As Needed 23)  Afrin Sinus 0.05 % Soln (Oxymetazoline Hcl) .... Two Times A Day X5days Before Nasonex 24)  Pepcid Ac Maximum Strength 20 Mg Tabs (Famotidine) .... One At Bedtime 25)  Sports coach (Misc. Devices) .Marland Kitchen.. 1 Gel Cushion For Pt's Wheelchair Dx: Stage 2 Pressure Ulcer 26)  Silvadene 1 % Crea (Silver Sulfadiazine) .... Apply Sparingly To Affected Area Two Times A Day 27)  Triamcinolone Acetonide 0.1 % Crea (Triamcinolone Acetonide) .... Apply To Rash Two Times A Day X 2 Wks Then Stop  Current Medications (verified): 1)  Ogen 1.25 1.5 Mg Tabs (Estropipate) .... Take 1 Tablet By Mouth Once A Day 2)  Citrucel   Powd (Methylcellulose (Laxative)) .Marland Kitchen.. 1 Tsp Once Daily 3)  Ascriptin 325 Mg  Tabs (Aspirin Buf(Alhyd-Mghyd-Cacar)) .Marland Kitchen.. 1 Once Daily 4)  Nasonex 50 Mcg/act  Susp (Mometasone Furoate) .Marland Kitchen.. 1-2 Puffs Each Nostril Two Times A Day 5)  Amlodipine Besylate 5 Mg Tabs (Amlodipine Besylate) .... Take 1 Tablet By Mouth Once A Day 6)  Klor-Con M20 20 Meq Cr-Tabs (Potassium Chloride Crys Cr) .... 2 Tabs By Mouth  Every Morning 7)  Prozac 40 Mg  Caps (Fluoxetine Hcl) .Marland Kitchen.. 1 Once Daily 8)  Multivitamins   Tabs (Multiple Vitamin) .... Once Daily 9)  Protonix 40 Mg  Pack (Pantoprazole Sodium) .Marland Kitchen.. 1tab Once Daily 10)  Clonidine Hcl 0.1 Mg  Tabs (Clonidine Hcl) .... 1/2  Two Times A Day 11)  Aricept 10 Mg Tabs (Donepezil Hcl) .... Take 1 Tablet By Mouth Once A Day 12)  Oxybutynin Chloride 5 Mg  Tabs (Oxybutynin Chloride) .Marland Kitchen.. 1 Three Times A Day 13)  Namenda 10 Mg  Tabs (Memantine Hcl) .Marland Kitchen.. 1 Two Times A Day 14)  Promethazine Hcl 25 Mg  Tabs (Promethazine Hcl) .Marland Kitchen.. 1 Tab By Mouth Every 6 Hours As Needed 15)  Hyoscyamine Sulfate 0.125 Mg  Tabs (Hyoscyamine Sulfate) .Marland Kitchen.. 1-2 Every 6 Hours As Needed 16)  Alprazolam 0.25 Mg  Tabs (Alprazolam) .Marland Kitchen.. 1 Every 8 Hours As Needed 17)  Triamterene-Hctz 50-25 Mg  Caps (Triamterene-Hctz) .... **chart Said 75/25mg **  Once Daily As Needed 18)  Migrazone 325-65-100 Mg Caps (Apap-Isometheptene-Dichloral) .... 2 At Onset Then 1 Every 4 Hours As Needed 19)  Advil 200 Mg  Tabs (Ibuprofen) .Marland KitchenMarland KitchenMarland Kitchen  3 Tabs W/ Meal Two Times A Day As Needed 20)  Hydrocodone-Acetaminophen 5-500 Mg Tabs (Hydrocodone-Acetaminophen) .Marland Kitchen.. 1 Tab Every 6 Hours As Needed 21)  Valtrex 1 Gm  Tabs (Valacyclovir Hcl) .... Once Dailyx 7 Days As Needed 22)  Drixoral Cold/allergy 6-120 Mg Xr12h-Tab (Dexbrompheniramine-Pseudoeph) .... Take 1 Tablet By Mouth Once A Day As Needed 23)  Afrin Sinus 0.05 % Soln (Oxymetazoline Hcl) .... Two Times A Day X5days Before Nasonex 24)  Pepcid Ac Maximum Strength 20 Mg Tabs (Famotidine) .... One At Bedtime 25)  Sports coach (Misc. Devices) .Marland Kitchen.. 1 Gel Cushion For Pt's Wheelchair Dx: Stage 2 Pressure Ulcer 26)  Silvadene 1 % Crea (Silver Sulfadiazine) .... Apply Sparingly To Affected Area Two Times A Day 27)  Triamcinolone Acetonide 0.1 % Crea (Triamcinolone Acetonide) .... Apply To Rash Two Times A Day X 2 Wks Then Stop 28)  Clotrimazole-Betamethasone 1-0.05 % Crea (Clotrimazole-Betamethasone) .... Apply To Area Two Times A Day As Needed Rash Under Breast and Groin. 29)  Diflucan 100 Mg Tabs (Fluconazole) .... 2 Today Then 1 Daily Till Gone  Allergies (verified): 1)  ! Pcn  Past History:  Past Surgical History: Last  updated: 10/24/2009 Cholecystectomy Total Hip Arthroplasty (bilat) Hysterectomy Rt rotator cuff surgery   Family History: Last updated: 01/22/2008 Family History of Breast Cancer:Mother, daughter Rectal Cancer: Mother  Social History: Last updated: 05/06/2008 Married Occupation: Housewife Patient smoked only as teenager Alcohol Use - no Illicit Drug Use - no  Risk Factors: Smoking Status: never (01/22/2008)  Past Medical History: COUGH (ICD-786.2) DEPRESSION (ICD-311) ANXIETY, CHRONIC (ICD-300.00) COLONIC POLYPS (ICD-211.3) DIVERTICULITIS, COLON (ICD-562.11)    - colonoscopy 03/21/2004 HYPERLIPIDEMIA, MIXED (ICD-272.2) BARRETT'S ESOPHAGUS, HX OF (ICD-V12.79).........................[facility]   - EGD 03/21/2004 OBESITY (ICD-278.00)   - Target wt  =   < 158  for BMI < 30  UTI'S, CHRONIC (ICD-599.0).......................................................Marland KitchenMarland KitchenPeterson OSTEOARTHRITIS (ICD-715.90) ASTHMATIC BRONCHITIS, ACUTE (ICD-466.0) DEMENTIA, MILD (ICD-294.8) HYPERTENSION (ICD-401.9) RHINITIS (ICD-472.0) Sacral Decubitus...........................................................................Marland KitchenDermatology-- -[facility] [place] 7/11  Skin Candida--breast/groin --tx w/ diflucan 8/26, rx lotrisone COMPLEX MED REGIMEN ---.Meds reviewed with pt education and computerized med calendar completed/adjusted-08/19/2008  Review of Systems      See HPI  Vital Signs:  Patient profile:   77 year old female Height:      62 inches Weight:      155.13 pounds BMI:     28.48 O2 Sat:      95 % on Room air Temp:     97.7 degrees F oral Pulse rate:   78 / minute BP sitting:   122 / 64  (left arm) Cuff size:   regular  Vitals Entered By: Boone Master CNA/MA (March 10, 2010 4:01 PM)  O2 Flow:  Room air  Physical Exam  Additional Exam:  obese chronically ill appearing      wt  175 August 16, 2009 > 165 October 24, 2009  > 156 January 04, 2010 >>159>>155  03/10/10 HEENT: nl dentition, turbinates, and orophanx. Nl external ear canals without cough reflex NECK :  without JVD/Nodes/TM/ nl carotid upstrokes bilaterally LUNGS: no acc muscle use, clear to A and P bilaterally without cough on insp or exp maneuvers CV:  RRR  no s3 or murmur or increase in P2, no edema   ABD:  soft and nontender with nl excursion in the supine position. No bruits or organomegaly, bowel sounds nl MS:  warm without deformities, calf tenderness, cyanosis or clubbing NEURO:  alert no focal deficits  Skin:  Grade I sacral decub with mild erythema-healing-improved since last visit.  under breast/and low abd/groin excortiated patches c/w candidia.       Impression & Recommendations:  Problem # 1:  DECUBITUS ULCER, SACRUM (ICD-707.03)  cont w/ wound care center this is improving.   Orders: Est. Patient Level IV (16109)  Problem # 2:  INTERTRIGO, CANDIDAL (ICD-695.89)  Recurrent case of candida under breast/groin due to morbid obesity, etc advised on preventive measures.  will tx w/ oral diflucan to help eradicate Plan:  Diflucan 100mg  2 by mouth first day, then 1 by mouth once daily 7 days Then you can use Lotrisone cream two times a day for yeast under breast/groin.  Keep skin dry, pat dry several times a day.  Please contact office for sooner follow up if symptoms do not improve or worsen   Orders: Est. Patient Level IV (60454)  Medications Added to Medication List This Visit: 1)  Clotrimazole-betamethasone 1-0.05 % Crea (Clotrimazole-betamethasone) .... Apply to area two times a day as needed rash under breast and groin. 2)  Diflucan 100 Mg Tabs (Fluconazole) .... 2 today then 1 daily till gone  Complete Medication List: 1)  Ogen 1.25 1.5 Mg Tabs (Estropipate) .... Take 1 tablet by mouth once a day 2)  Citrucel Powd (Methylcellulose (laxative)) .Marland Kitchen.. 1 tsp once daily 3)  Ascriptin 325 Mg Tabs (Aspirin buf(alhyd-mghyd-cacar)) .Marland Kitchen.. 1 once daily 4)  Nasonex  50 Mcg/act Susp (Mometasone furoate) .Marland Kitchen.. 1-2 puffs each nostril two times a day 5)  Amlodipine Besylate 5 Mg Tabs (Amlodipine besylate) .... Take 1 tablet by mouth once a day 6)  Klor-con M20 20 Meq Cr-tabs (Potassium chloride crys cr) .... 2 tabs by mouth  every morning 7)  Prozac 40 Mg Caps (Fluoxetine hcl) .Marland Kitchen.. 1 once daily 8)  Multivitamins Tabs (Multiple vitamin) .... Once daily 9)  Protonix 40 Mg Pack (Pantoprazole sodium) .Marland Kitchen.. 1tab once daily 10)  Clonidine Hcl 0.1 Mg Tabs (Clonidine hcl) .... 1/2 two times a day 11)  Aricept 10 Mg Tabs (Donepezil hcl) .... Take 1 tablet by mouth once a day 12)  Oxybutynin Chloride 5 Mg Tabs (Oxybutynin chloride) .Marland Kitchen.. 1 three times a day 13)  Namenda 10 Mg Tabs (Memantine hcl) .Marland Kitchen.. 1 two times a day 14)  Promethazine Hcl 25 Mg Tabs (Promethazine hcl) .Marland Kitchen.. 1 tab by mouth every 6 hours as needed 15)  Hyoscyamine Sulfate 0.125 Mg Tabs (Hyoscyamine sulfate) .Marland Kitchen.. 1-2 every 6 hours as needed 16)  Alprazolam 0.25 Mg Tabs (Alprazolam) .Marland Kitchen.. 1 every 8 hours as needed 17)  Triamterene-hctz 50-25 Mg Caps (Triamterene-hctz) .... **chart said 75/25mg **  once daily as needed 18)  Migrazone 325-65-100 Mg Caps (Apap-isometheptene-dichloral) .... 2 at onset then 1 every 4 hours as needed 19)  Advil 200 Mg Tabs (Ibuprofen) .... 3 tabs w/ meal two times a day as needed 20)  Hydrocodone-acetaminophen 5-500 Mg Tabs (Hydrocodone-acetaminophen) .Marland Kitchen.. 1 tab every 6 hours as needed 21)  Valtrex 1 Gm Tabs (Valacyclovir hcl) .... Once dailyx 7 days as needed 22)  Drixoral Cold/allergy 6-120 Mg Xr12h-tab (Dexbrompheniramine-pseudoeph) .... Take 1 tablet by mouth once a day as needed 23)  Afrin Sinus 0.05 % Soln (Oxymetazoline hcl) .... Two times a day x5days before nasonex 24)  Pepcid Ac Maximum Strength 20 Mg Tabs (Famotidine) .... One at bedtime 25)  Development worker, community (Misc. devices) .Marland Kitchen.. 1 gel cushion for pt's wheelchair dx: stage 2 pressure ulcer 26)  Silvadene 1 % Crea  (Silver sulfadiazine) .Marland KitchenMarland KitchenMarland Kitchen  Apply sparingly to affected area two times a day 27)  Triamcinolone Acetonide 0.1 % Crea (Triamcinolone acetonide) .... Apply to rash two times a day x 2 wks then stop 28)  Clotrimazole-betamethasone 1-0.05 % Crea (Clotrimazole-betamethasone) .... Apply to area two times a day as needed rash under breast and groin. 29)  Diflucan 100 Mg Tabs (Fluconazole) .... 2 today then 1 daily till gone  Patient Instructions: 1)  Diflucan 100mg  2 by mouth first day, then 1 by mouth once daily 7 days 2)  Then you can use Lotrisone cream two times a day for yeast under breast/groin.  3)  Keep skin dry, pat dry several times a day.  4)  Please contact office for sooner follow up if symptoms do not improve or worsen  Prescriptions: DIFLUCAN 100 MG TABS (FLUCONAZOLE) 2 today then 1 daily till gone  #8 x 0   Entered by:   Boone Master CNA/MA   Authorized by:   Rubye Oaks NP   Signed by:   Boone Master CNA/MA on 03/13/2010   Method used:   Electronically to        [facility] [phone]* (retail)       [address]       [place], [place]  [address]       Ph: [phone]       Fax: [phone]   RxID:   [id number] CLOTRIMAZOLE-BETAMETHASONE 1-0.05 % CREA (CLOTRIMAZOLE-BETAMETHASONE) apply to area two times a day as needed rash under breast and groin.  #1 x 3   Entered and Authorized by:   Rubye Oaks NP   Signed by:   Tammy Parrett NP on 03/10/2010   Method used:   Electronically to        [id number]* (retail)       [address]       [place], [place]  [address]       Ph: [phone]       Fax: [phone]   RxID:   [id number]

## 2010-08-15 NOTE — Letter (Signed)
Summary: [facility]  [facility]   Imported By: Lennie Odor 08/08/2009 14:26:48  _____________________________________________________________________  External Attachment:    Type:   Image     Comment:   External Document

## 2010-08-15 NOTE — Miscellaneous (Signed)
Summary: [facility]  [facility]   Imported By: Lester [place] 02/03/2010 10:56:18  _____________________________________________________________________  External Attachment:    Type:   Image     Comment:   External Document

## 2010-08-15 NOTE — Assessment & Plan Note (Signed)
Summary: Pulmonary/ f/u ov   Primary Provider/Referring Provider:  Dorthey Sawyer  CC:  Yeast infection and decubitus ulcer- both have improved.  Marland Kitchen  History of Present Illness: 77  yowf with minimal smoking hx  with morbid obesity who has a known history of asthmatic bronchitis, hypertension, osteoarthritis and mild dementia.  7/910--Presents for an acute office visit. Complains of pain in right side, states goes around to the right flank area onset today.   This am was moderate, better after lunch. No urinary symptoms, Denies chest pain, dyspnea, orthopnea, hemoptysis, fever, n/v/d, edema, headache. No otc used. no food association. Has had intermittent abd pain in past , comes and goes. sometimes has gas, bloating.   June 27, 2009--Complains of sinus pressure/congestion with green/yellow mucus, PND x1week, poss UTI with slight burning and frequency x1week, rawness/redness x2days and has a red spot in outer right calf that does not itch/burn x >15month. Accompanied by husband and daughter today.  August 16, 2009 ov for surgical clearance  very sedentary, rarely goes out except to eat. last walked at a department store maybe a month ago. not doing any mentally challenging activities either. however,     10/24/09--CPX  October 31, 2009--Presents for 1 week follow up and med review. We reviewed meds today and updated her med calendar. She still does not feel good, w/ decreased appetite, labs were essentially unremarkable except for low K+   January 04, 2010 ov c/o decrease in appetite over the past month.  She also c/o some occ difficulty with swallowing.  Has gerd per GI (Dr Doreatha Martin) denies non adherence with rx as outlined in med calendar and inventoried accurately under present meds.  sore on sacrum no better, using donut to sit.  Urgency also but no dysuria.   --stopped lasix, pepcid added, decreased k-dur.     February 07, 2010 -returns for follow up 4 week follow up - states appetite and  swallowing have improved. Last visit Pepcid added. She is going to wound care center in [facility] for sacral decub. SHe has quite abit of pain from this. Area is excoriated. and sensitive. She is taking her meds well. Husband is here with her today, he helps her with daily care.   March 14, 2010--Presents for yeast infection under both breasts and both sides of groin area x7 5months - red, painful, itching. Has used several creams but not going away. Has been prone to this in past. No fever, drainage or blisters. Denies chest pain, dyspnea, orthopnea, hemoptysis, fever, n/v/d, edema, headache.   May 02, 2010 Yeast infection and decubitus ulcer- both have improved.  Pt denies any significant sore throat, dysphagia, itching, sneezing,  nasal congestion or excess secretions,  fever, chills, sweats, unintended wt loss, pleuritic or exertional cp, hempoptysis, change in activity tolerance  orthopnea pnd or leg swelling      Current Medications (verified): 1)  Ogen 1.25 1.5 Mg Tabs (Estropipate) .... Take 1 Tablet By Mouth Once A Day 2)  Citrucel   Powd (Methylcellulose (Laxative)) .Marland Kitchen.. 1 Tsp Once Daily 3)  Ascriptin 325 Mg  Tabs (Aspirin Buf(Alhyd-Mghyd-Cacar)) .Marland Kitchen.. 1 Once Daily 4)  Nasonex 50 Mcg/act  Susp (Mometasone Furoate) .Marland Kitchen.. 1-2 Puffs Each Nostril Two Times A Day 5)  Amlodipine Besylate 5 Mg Tabs (Amlodipine Besylate) .... Take 1 Tablet By Mouth Once A Day 6)  Klor-Con M20 20 Meq Cr-Tabs (Potassium Chloride Crys Cr) .... 2 Tabs By Mouth  Every Morning 7)  Prozac 40 Mg  Caps (  Fluoxetine Hcl) .Marland Kitchen.. 1 Once Daily 8)  Multivitamins   Tabs (Multiple Vitamin) .... Once Daily 9)  Protonix 40 Mg  Pack (Pantoprazole Sodium) .Marland Kitchen.. 1tab Once Daily 10)  Clonidine Hcl 0.1 Mg  Tabs (Clonidine Hcl) .... 1/2 Two Times A Day 11)  Aricept 10 Mg Tabs (Donepezil Hcl) .... Take 1 Tablet By Mouth Once A Day 12)  Oxybutynin Chloride 5 Mg  Tabs (Oxybutynin Chloride) .Marland Kitchen.. 1 Three Times A Day 13)  Namenda 10 Mg  Tabs  (Memantine Hcl) .Marland Kitchen.. 1 Two Times A Day 14)  Promethazine Hcl 25 Mg  Tabs (Promethazine Hcl) .Marland Kitchen.. 1 Tab By Mouth Every 6 Hours As Needed 15)  Hyoscyamine Sulfate 0.125 Mg  Tabs (Hyoscyamine Sulfate) .Marland Kitchen.. 1-2 Every 6 Hours As Needed 16)  Alprazolam 0.25 Mg  Tabs (Alprazolam) .Marland Kitchen.. 1 Every 8 Hours As Needed 17)  Triamterene-Hctz 50-25 Mg  Caps (Triamterene-Hctz) .... **chart Said 75/25mg **  Once Daily As Needed 18)  Migrazone 325-65-100 Mg Caps (Apap-Isometheptene-Dichloral) .... 2 At Onset Then 1 Every 4 Hours As Needed 19)  Advil 200 Mg  Tabs (Ibuprofen) .... 3 Tabs W/ Meal Two Times A Day As Needed 20)  Hydrocodone-Acetaminophen 5-500 Mg Tabs (Hydrocodone-Acetaminophen) .Marland Kitchen.. 1 Tab Every 6 Hours As Needed 21)  Valtrex 1 Gm  Tabs (Valacyclovir Hcl) .... Once Dailyx 7 Days As Needed 22)  Drixoral Cold/allergy 6-120 Mg Xr12h-Tab (Dexbrompheniramine-Pseudoeph) .... Take 1 Tablet By Mouth Once A Day As Needed 23)  Afrin Sinus 0.05 % Soln (Oxymetazoline Hcl) .... Two Times A Day X5days Before Nasonex 24)  Pepcid Ac Maximum Strength 20 Mg Tabs (Famotidine) .... One At Bedtime 25)  Silvadene 1 % Crea (Silver Sulfadiazine) .... Apply Sparingly To Affected Area Two Times A Day 26)  Clotrimazole-Betamethasone 1-0.05 % Crea (Clotrimazole-Betamethasone) .... Apply To Area Two Times A Day As Needed Rash Under Breast and Groin.  Allergies (verified): 1)  ! Pcn  Past History:  Past Medical History: COUGH (ICD-786.2) DEPRESSION (ICD-311) ANXIETY, CHRONIC (ICD-300.00) COLONIC POLYPS (ICD-211.3) DIVERTICULITIS, COLON (ICD-562.11)    - colonoscopy 03/21/2004 HYPERLIPIDEMIA, MIXED (ICD-272.2) BARRETT'S ESOPHAGUS, HX OF (ICD-V12.79).........................[facility]   - EGD 03/21/2004 OBESITY (ICD-278.00)   - Target wt  =   < 158  for BMI < 30  UTI'S, CHRONIC (ICD-599.0)..........................................................Marland KitchenMarland KitchenPeterson OSTEOARTHRITIS (ICD-715.90) ASTHMATIC BRONCHITIS, ACUTE  (ICD-466.0) DEMENTIA, MILD (ICD-294.8) HYPERTENSION (ICD-401.9) RHINITIS (ICD-472.0) Sacral Decubitus...........................................................................Marland KitchenDermatology-- -[facility] [place] 7/11  Skin Candida--breast/groin --tx w/ diflucan 8/26, rx lotrisone COMPLEX MED REGIMEN ---.Meds reviewed with pt education and computerized med calendar completed/adjusted-08/19/2008  Vital Signs:  Patient profile:   77 year old female Weight:      148.25 pounds O2 Sat:      95 % on Room air Temp:     97.5 degrees F oral Pulse rate:   80 / minute BP sitting:   120 / 76  (left arm)  Vitals Entered By: Vernie Murders (May 02, 2010 2:23 PM)  O2 Flow:  Room air  Physical Exam  Additional Exam:  obese chronically ill appearing   wheelchair bound   wt  175 August 16, 2009 > 165 October 24, 2009  > 156 January 04, 2010 >>159>>155 03/10/10 . 148 May 02, 2010  HEENT: nl dentition, turbinates, and orophanx. Nl external ear canals without cough reflex NECK :  without JVD/Nodes/TM/ nl carotid upstrokes bilaterally LUNGS: no acc muscle use, clear to A and P bilaterally without cough on insp or exp maneuvers CV:  RRR  no  s3 or murmur or increase in P2, no edema   ABD:  soft and nontender with nl excursion in the supine position. No bruits or organomegaly, bowel sounds nl MS:  warm without deformities, calf tenderness, cyanosis or clubbing NEURO:  alert no focal deficits Skin:  bruise L forehead    Impression & Recommendations:  Problem # 1:  HYPERTENSION (ICD-401.9)  Her updated medication list for this problem includes:    Amlodipine Besylate 5 Mg Tabs (Amlodipine besylate) .Marland Kitchen... Take 1 tablet by mouth once a day    Clonidine Hcl 0.1 Mg Tabs (Clonidine hcl) .Marland Kitchen... 1/2 two times a day    Triamterene-hctz 50-25 Mg Caps (Triamterene-hctz) .Marland Kitchen... **chart said 75/25mg **  once daily as needed   ok on rx with no sign orthostasis  Orders: Est. Patient Level III  (88416) Prescription Created Electronically [id number])  Problem # 2:  DEMENTIA, MILD (ICD-294.8)  Gradually declining with freq falls, will need nhp soon  Orders: Est. Patient Level III (16010)  Medications Added to Medication List This Visit: 1)  Klor-con M20 20 Meq Cr-tabs (Potassium chloride crys cr) .... 2 tabs by mouth  every morning  Patient Instructions: 1)  Hold ascriptin (aspirin) if any bleeding anywhere until bleeding is gone for 3 straight days 2)  Please schedule a follow-up appointment in 6 weeks, sooner if needed with Tammy  Prescriptions: KLOR-CON M20 20 MEQ CR-TABS (POTASSIUM CHLORIDE CRYS CR) 2 tabs by mouth  every morning  #180 x 3   Entered and Authorized by:   Nyoka Cowden MD   Signed by:   Nyoka Cowden MD on 05/02/2010   Method used:   Printed then faxed to ...       [facility]  [address] [phone]* (retail)       [address]       [place], [place]  [address]       Ph: [phone]       Fax: [phone]   RxID:   [id number]

## 2010-08-15 NOTE — Progress Notes (Signed)
Summary: Schedule Colonoscopy   Phone Note Outgoing Call Call back at Home Phone [phone]   Call placed by: Harlow Mares CMA Duncan Dull),  September 15, 2009 3:55 PM Call placed to: Patient Summary of Call: spoke to the patients husband and he would like for me to call back in 6 weeks to schedule the patients colonoscopy she is getting ready to have shoulder surgery. I sent myself a flag to call her back. 10/28/2009.  Initial call taken by: Harlow Mares CMA Duncan Dull),  September 15, 2009 3:56 PM     Appended Document: Schedule Colonoscopy patients husband not ready to schedule at this time he will  call back in 3 months.

## 2010-08-17 NOTE — Assessment & Plan Note (Signed)
Summary: NP follow up - med calendar   Copy to:  na Primary Provider/Referring Provider:  Sandrea Hughs, MD  CC:  est med calendar - pt brought all meds with her today.  requesting flu and pna shots today.  no new complaints..  History of Present Illness: 77  yowf with minimal smoking hx  with morbid obesity who has a known history of asthmatic bronchitis, hypertension, osteoarthritis and mild dementia.  7/910--Presents for an acute office visit. Complains of pain in right side, states goes around to the right flank area onset today.   This am was moderate, better after lunch. No urinary symptoms, Denies chest pain, dyspnea, orthopnea, hemoptysis, fever, n/v/d, edema, headache. No otc used. no food association. Has had intermittent abd pain in past , comes and goes. sometimes has gas, bloating.   June 27, 2009--Complains of sinus pressure/congestion with green/yellow mucus, PND x1week, poss UTI with slight burning and frequency x1week, rawness/redness x2days and has a red spot in outer right calf that does not itch/burn x >7month. Accompanied by husband and daughter today.  August 16, 2009 ov for surgical clearance  very sedentary, rarely goes out except to eat. last walked at a department store maybe a month ago. not doing any mentally challenging activities either. however,     10/24/09--CPX  October 31, 2009--Presents for 1 week follow up and med review. We reviewed meds today and updated her med calendar. She still does not feel good, w/ decreased appetite, labs were essentially unremarkable except for low K+   January 04, 2010 ov c/o decrease in appetite over the past month.  She also c/o some occ difficulty with swallowing.  Has gerd per GI (Dr Doreatha Martin) denies non adherence with rx as outlined in med calendar and inventoried accurately under present meds.  sore on sacrum no better, using donut to sit.  Urgency also but no dysuria.   --stopped lasix, pepcid added, decreased k-dur.     February 07, 2010 -returns for follow up 4 week follow up - states appetite and swallowing have improved. Last visit Pepcid added. She is going to wound care center in [facility] for sacral decub. SHe has quite abit of pain from this. Area is excoriated. and sensitive. She is taking her meds well. Husband is here with her today, he helps her with daily care.   March 14, 2010--Presents for yeast infection under both breasts and both sides of groin area x7months - red, painful, itching. Has used several creams but not going away. Has been prone to this in past. No fever, drainage or blisters. Denies chest pain, dyspnea, orthopnea, hemoptysis, fever, n/v/d, edema, headache.   May 02, 2010 Yeast infection and decubitus ulcer- both have improved.     June 26, 2010 --Returns for follow up and med review. Underwent endoscopy last week, essentially nml mucosa w/ stricture s/p dilatation. Tolerated well. We reviewed all her meds and updated her calendar. Husband helps with her meds and numbers her bottles to correlate with med calendar schedule. Denies chest pain,  orthopnea, hemoptysis, fever, n/v/d, edema, headache,recent travel or antibiotics.      Medications Prior to Update: 1)  Ogen 1.25 1.5 Mg Tabs (Estropipate) .... Take 1 Tablet By Mouth Once A Day 2)  Citrucel   Powd (Methylcellulose (Laxative)) .Marland Kitchen.. 1 Tsp Once Daily 3)  Ascriptin 325 Mg  Tabs (Aspirin Buf(Alhyd-Mghyd-Cacar)) .Marland Kitchen.. 1 Once Daily 4)  Nasonex 50 Mcg/act  Susp (Mometasone Furoate) .Marland Kitchen.. 1-2 Puffs Each Nostril Two  Times A Day 5)  Amlodipine Besylate 5 Mg Tabs (Amlodipine Besylate) .... Take 1 Tablet By Mouth Once A Day 6)  Klor-Con M20 20 Meq Cr-Tabs (Potassium Chloride Crys Cr) .... 2 Tabs By Mouth  Every Morning 7)  Prozac 40 Mg  Caps (Fluoxetine Hcl) .Marland Kitchen.. 1 Once Daily 8)  Multivitamins   Tabs (Multiple Vitamin) .... Once Daily 9)  Protonix 40 Mg  Pack (Pantoprazole Sodium) .Marland Kitchen.. 1tab Once Daily 10)  Clonidine Hcl 0.1 Mg  Tabs  (Clonidine Hcl) .... 1/2 Two Times A Day 11)  Aricept 10 Mg Tabs (Donepezil Hcl) .... Take 1 Tablet By Mouth Once A Day 12)  Oxybutynin Chloride 5 Mg  Tabs (Oxybutynin Chloride) .Marland Kitchen.. 1 Three Times A Day 13)  Namenda 10 Mg  Tabs (Memantine Hcl) .Marland Kitchen.. 1 Two Times A Day 14)  Promethazine Hcl 25 Mg  Tabs (Promethazine Hcl) .Marland Kitchen.. 1 Tab By Mouth Every 6 Hours As Needed 15)  Hyoscyamine Sulfate 0.125 Mg  Tabs (Hyoscyamine Sulfate) .Marland Kitchen.. 1-2 Every 6 Hours As Needed 16)  Alprazolam 0.25 Mg  Tabs (Alprazolam) .Marland Kitchen.. 1 Every 8 Hours As Needed 17)  Triamterene-Hctz 50-25 Mg  Caps (Triamterene-Hctz) .... **chart Said 75/25mg **  Once Daily As Needed 18)  Migrazone 325-65-100 Mg Caps (Apap-Isometheptene-Dichloral) .... 2 At Onset Then 1 Every 4 Hours As Needed 19)  Advil 200 Mg  Tabs (Ibuprofen) .... 3 Tabs W/ Meal Two Times A Day As Needed 20)  Hydrocodone-Acetaminophen 5-500 Mg Tabs (Hydrocodone-Acetaminophen) .Marland Kitchen.. 1 Tab Every 6 Hours As Needed 21)  Valtrex 1 Gm  Tabs (Valacyclovir Hcl) .... Once Dailyx 7 Days As Needed 22)  Drixoral Cold/allergy 6-120 Mg Xr12h-Tab (Dexbrompheniramine-Pseudoeph) .... Take 1 Tablet By Mouth Once A Day As Needed 23)  Afrin Sinus 0.05 % Soln (Oxymetazoline Hcl) .... Two Times A Day X5days Before Nasonex 24)  Pepcid Ac Maximum Strength 20 Mg Tabs (Famotidine) .... One At Bedtime 25)  Silvadene 1 % Crea (Silver Sulfadiazine) .... Apply Sparingly To Affected Area Two Times A Day 26)  Clotrimazole-Betamethasone 1-0.05 % Crea (Clotrimazole-Betamethasone) .... Apply To Area Two Times A Day As Needed Rash Under Breast and Groin.  Allergies (verified): 1)  ! Pcn  Past History:  Past Surgical History: Last updated: 05/22/2010 Cholecystectomy Total Hip Arthroplasty (bilat) Hysterectomy Rt rotator cuff surgery   Family History: Last updated: 05/22/2010 Family History of Breast Cancer:Mother, Daughter Rectal/Colon Cancer: Mother   Social History: Last updated:  05/22/2010 Occupation: Housewife Married Patient smoked only as teenager Alcohol Use - no Illicit Drug Use - no  Risk Factors: Smoking Status: never (01/22/2008)  Past Medical History: COUGH (ICD-786.2) DEPRESSION (ICD-311) ANXIETY, CHRONIC (ICD-300.00) COLONIC POLYPS (ICD-211.3) DIVERTICULITIS, COLON (ICD-562.11)    - colonoscopy 03/21/2004 HYPERLIPIDEMIA, MIXED (ICD-272.2) BARRETT'S ESOPHAGUS, HX OF (ICD-V12.79).........................[facility]   - EGD 03/21/2004 OBESITY (ICD-278.00)   - Target wt  =   < 158  for BMI < 30  UTI'S, CHRONIC (ICD-599.0)..........................................................Marland KitchenMarland KitchenPeterson OSTEOARTHRITIS (ICD-715.90) ASTHMATIC BRONCHITIS, ACUTE (ICD-466.0) DEMENTIA, MILD (ICD-294.8) HYPERTENSION (ICD-401.9) RHINITIS (ICD-472.0) Sacral Decubitus...........................................................................Marland KitchenDermatology-- -[facility] [place] 7/11  Skin Candida--breast/groin --tx w/ diflucan 8/26, rx lotrisone COMPLEX MED REGIMEN ---.Meds reviewed with pt education and computerized med calendar completed/adjusted-08/19/2008, June 26, 2010   Review of Systems      See HPI  Vital Signs:  Patient profile:   77 year old female Height:      62 inches Weight:      151.25 pounds BMI:     27.76 O2 Sat:  96 % on Room air Temp:     97.8 degrees F oral Pulse rate:   97 / minute BP sitting:   130 / 70  (left arm) Cuff size:   regular  Vitals Entered By: Boone Master CNA/MA (June 26, 2010 2:33 PM)  O2 Flow:  Room air CC: est med calendar - pt brought all meds with her today.  requesting flu and pna shots today.  no new complaints. Is Patient Diabetic? No Comments Medications reviewed with patient Daytime contact number verified with patient. Boone Master CNA/MA  June 26, 2010 2:33 PM    Physical Exam  Additional Exam:  obese chronically ill appearing   wheelchair bound   wt  175 August 16, 2009 > 165 October 24, 2009  > 156 January 04, 2010 >>159>>155 03/10/10 . 148 May 02, 2010 >>December12, 2011 151  HEENT: nl dentition, turbinates, and orophanx. Nl external ear canals without cough reflex NECK :  without JVD/Nodes/TM/ nl carotid upstrokes bilaterally LUNGS: no acc muscle use, clear to A and P bilaterally without cough on insp or exp maneuvers CV:  RRR  no s3 or murmur or increase in P2, no edema   ABD:  soft and nontender with nl excursion in the supine position. No bruits or organomegaly, bowel sounds nl MS:  warm without deformities, calf tenderness, cyanosis or clubbing NEURO:  alert no focal deficits Skin: intact    Impression & Recommendations:  Problem # 1:  DEPRESSION (ICD-311)  controlled   Orders: Est. Patient Level IV (04540)  Problem # 2:  GERD (ICD-530.81) controlled on rx  labs pending  Her updated medication list for this problem includes:    Protonix 40 Mg Pack (Pantoprazole sodium) .Marland Kitchen... 1tab once daily    Hyoscyamine Sulfate 0.125 Mg Tabs (Hyoscyamine sulfate) .Marland Kitchen... 1-2 every 6 hours as needed    Pepcid Ac Maximum Strength 20 Mg Tabs (Famotidine) ..... One at bedtime  Orders: TLB-CBC Platelet - w/Differential (85025-CBCD) Est. Patient Level IV (98119)  Problem # 3:  HYPERTENSION (ICD-401.9)  controlled on rx  labs pending.  Meds reviewed with pt education and computerized med calendar adjusted  Her updated medication list for this problem includes:    Amlodipine Besylate 5 Mg Tabs (Amlodipine besylate) .Marland Kitchen... Take 1 tablet by mouth once a day    Clonidine Hcl 0.1 Mg Tabs (Clonidine hcl) .Marland Kitchen... 1/2 two times a day    Triamterene-hctz 50-25 Mg Caps (Triamterene-hctz) .Marland Kitchen... **chart said 75/25mg **  once daily as needed  BP today: 130/70 Prior BP: 122/74 (05/22/2010)  Labs Reviewed: K+: 3.6 (02/07/2010) Creat: : 0.9 (02/07/2010)     Orders: Est. Patient Level IV (14782)  Other Orders: TLB-BMP (Basic Metabolic Panel-BMET)  (80048-METABOL)  Patient Instructions: 1)  Flu and pneumovax today 2)  Cont on same meds.  3)  I will call with lab resutls.  4)  Please contact office for sooner follow up if symptoms do not improve or worsen  5)  follow up Dr. Sherene Sires in 3 months     Appended Document: Flu and PNA vaccine documentation     Clinical Lists Changes  Orders: Added new Service order of Flu Vaccine 6yrs + MEDICARE PATIENTS (N5621) - Signed Added new Service order of Administration Flu vaccine - MCR (H0865) - Signed Added new Service order of Pneumococcal Vaccine (78469) - Signed Added new Service order of Admin 1st Vaccine (62952) - Signed Observations: Added new observation of PNEUMOVAXVIS: 06/20/09 version given June 26, 2010. (06/26/2010  17:24) Added new observation of PNEUMOVAXLOT: [id number] (06/26/2010 17:24) Added new observation of PNEUMOVAXEXP: 11/23/2011 (06/26/2010 17:24) Added new observation of PNEUMOVAXBY: Boone Master CNA/MA (06/26/2010 17:24) Added new observation of PNEUMOVAXRTE: IM (06/26/2010 17:24) Added new observation of PNEUMOVAXDOS: 0.5 ml (06/26/2010 17:24) Added new observation of PNEUMOVAXMFR: Merck (06/26/2010 17:24) Added new observation of PNEUMOVAXSIT: right deltoid (06/26/2010 17:24) Added new observation of PNEUMOVAX: Pneumovax (06/26/2010 17:24) Added new observation of FLU VAX VIS: 02/07/2010 version (06/26/2010 17:24) Added new observation of FLU VAXLOT: [id number] (06/26/2010 17:24) Added new observation of FLU VAXMFR: Glaxosmithkline (06/26/2010 17:24) Added new observation of FLU VAX EXP: 01/13/2011 (06/26/2010 17:24) Added new observation of FLU VAX DSE: 0.62ml (06/26/2010 17:24) Added new observation of FLU VAX: Fluvax 3+ (06/26/2010 17:24)Flu Vaccine Consent Questions     Do you have a history of severe allergic reactions to this vaccine? no    Any prior history of allergic reactions to egg and/or gelatin? no    Do you have a sensitivity to the preservative  Thimersol? no    Do you have a past history of Guillan-Barre Syndrome? no    Do you currently have an acute febrile illness? no    Have you ever had a severe reaction to latex? no    Vaccine information given and explained to patient? yes    Are you currently pregnant? no    Lot Number:[id number]   Exp Date:01/13/2011   Site Given  Left Deltoid IMservation of FLU VAX DSE: 0.63ml (06/26/2010 17:24) Added new observation of FLU VAX: Fluvax 3+ (06/26/2010 17:24)     Boone Master CNA/MA  June 26, 2010 5:24 PM    Immunizations Administered:  Pneumonia Vaccine:    Vaccine Type: Pneumovax    Site: right deltoid    Mfr: Merck    Dose: 0.5 ml    Route: IM    Given by: Boone Master CNA/MA    Exp. Date: 11/23/2011    Lot #: [id number]    VIS given: 06/20/09 version given June 26, 2010.  Appended Document: med calendar update Medications Added CITRUCEL   POWD (METHYLCELLULOSE (LAXATIVE)) 1 teaspoon by mouth  every morning ASCRIPTIN 325 MG  TABS (ASPIRIN BUF(ALHYD-MGHYD-CACAR)) Take 1 tablet by mouth every morning NASONEX 50 MCG/ACT  SUSP (MOMETASONE FUROATE) 1-2 puffs each nostril  every morning and at bedtime MULTIVITAMINS   TABS (MULTIPLE VITAMIN) Take 1 tablet by mouth every morning          Clinical Lists Changes  Medications: Changed medication from CITRUCEL   POWD (METHYLCELLULOSE (LAXATIVE)) 1 tsp once daily to CITRUCEL   POWD (METHYLCELLULOSE (LAXATIVE)) 1 teaspoon by mouth  every morning - Signed Changed medication from ASCRIPTIN 325 MG  TABS (ASPIRIN BUF(ALHYD-MGHYD-CACAR)) 1 once daily to ASCRIPTIN 325 MG  TABS (ASPIRIN BUF(ALHYD-MGHYD-CACAR)) Take 1 tablet by mouth every morning - Signed Changed medication from NASONEX 50 MCG/ACT  SUSP (MOMETASONE FUROATE) 1-2 puffs each nostril two times a day to NASONEX 50 MCG/ACT  SUSP (MOMETASONE FUROATE) 1-2 puffs each nostril  every morning and at bedtime - Signed Changed medication from MULTIVITAMINS   TABS (MULTIPLE VITAMIN)  once daily to MULTIVITAMINS   TABS (MULTIPLE VITAMIN) Take 1 tablet by mouth every morning - Signed Removed medication of SILVADENE 1 % CREA (SILVER SULFADIAZINE) apply sparingly to affected area two times a day Removed medication of CLOTRIMAZOLE-BETAMETHASONE 1-0.05 % CREA (CLOTRIMAZOLE-BETAMETHASONE) apply to area two times a day as needed rash under breast and groin.

## 2010-08-24 ENCOUNTER — Ambulatory Visit (INDEPENDENT_AMBULATORY_CARE_PROVIDER_SITE_OTHER): Payer: Medicare Other | Admitting: Adult Health

## 2010-08-24 ENCOUNTER — Encounter: Payer: Self-pay | Admitting: Adult Health

## 2010-08-24 ENCOUNTER — Other Ambulatory Visit: Payer: Self-pay | Admitting: Internal Medicine

## 2010-08-24 ENCOUNTER — Ambulatory Visit (INDEPENDENT_AMBULATORY_CARE_PROVIDER_SITE_OTHER)
Admission: RE | Admit: 2010-08-24 | Discharge: 2010-08-24 | Disposition: A | Discharge status: Home / Self Care | Payer: Medicare Other | Source: Ambulatory Visit | Attending: Internal Medicine | Admitting: Internal Medicine

## 2010-08-24 DIAGNOSIS — M549 Dorsalgia, unspecified: Secondary | ICD-10-CM

## 2010-08-24 DIAGNOSIS — L89109 Pressure ulcer of unspecified part of back, unspecified stage: Secondary | ICD-10-CM

## 2010-08-24 DIAGNOSIS — L8990 Pressure ulcer of unspecified site, unspecified stage: Secondary | ICD-10-CM

## 2010-08-25 ENCOUNTER — Encounter (INDEPENDENT_AMBULATORY_CARE_PROVIDER_SITE_OTHER): Payer: Self-pay | Admitting: *Deleted

## 2010-08-25 ENCOUNTER — Other Ambulatory Visit: Payer: Self-pay | Admitting: Adult Health

## 2010-08-25 ENCOUNTER — Other Ambulatory Visit: Payer: Medicare Other

## 2010-08-25 DIAGNOSIS — M549 Dorsalgia, unspecified: Secondary | ICD-10-CM

## 2010-08-25 LAB — URINALYSIS, ROUTINE W REFLEX MICROSCOPIC
Bilirubin Urine: NEGATIVE
Hgb urine dipstick: NEGATIVE
Ketones, ur: NEGATIVE
Total Protein, Urine: NEGATIVE
Urine Glucose: NEGATIVE

## 2010-08-26 ENCOUNTER — Encounter: Payer: Self-pay | Admitting: Adult Health

## 2010-08-31 NOTE — Assessment & Plan Note (Signed)
Summary: Acute NP office visit - low back pain   Copy to:  na Primary Provider/Referring Provider:  Sandrea Hughs, MD  CC:  sharp pains in lower back that radiates into both legs x7month, worse in the past 2 weeks.  also states area on right buttock is more inflammed x4-5days, and has been treating w/ A&D ointment.  History of Present Illness: 77  yowf with minimal smoking hx  with morbid obesity who has a known history of asthmatic bronchitis, hypertension, osteoarthritis and mild dementia.  7/910--Presents for an acute office visit. Complains of pain in right side, states goes around to the right flank area onset today.   This am was moderate, better after lunch. No urinary symptoms, Denies chest pain, dyspnea, orthopnea, hemoptysis, fever, n/v/d, edema, headache. No otc used. no food association. Has had intermittent abd pain in past , comes and goes. sometimes has gas, bloating.   June 27, 2009--Complains of sinus pressure/congestion with green/yellow mucus, PND x1week, poss UTI with slight burning and frequency x1week, rawness/redness x2days and has a red spot in outer right calf that does not itch/burn x >75month. Accompanied by husband and daughter today.  August 16, 2009 ov for surgical clearance  very sedentary, rarely goes out except to eat. last walked at a department store maybe a month ago. not doing any mentally challenging activities either. however,     10/24/09--CPX  October 31, 2009--Presents for 1 week follow up and med review. We reviewed meds today and updated her med calendar. She still does not feel good, w/ decreased appetite, labs were essentially unremarkable except for low K+   January 04, 2010 ov c/o decrease in appetite over the past month.  She also c/o some occ difficulty with swallowing.  Has gerd per GI (Dr Doreatha Martin) denies non adherence with rx as outlined in med calendar and inventoried accurately under present meds.  sore on sacrum no better, using donut to sit.   Urgency also but no dysuria.   --stopped lasix, pepcid added, decreased k-dur.     February 07, 2010 -returns for follow up 4 week follow up - states appetite and swallowing have improved. Last visit Pepcid added. She is going to wound care center in [facility] for sacral decub. SHe has quite abit of pain from this. Area is excoriated. and sensitive. She is taking her meds well. Husband is here with her today, he helps her with daily care.   March 14, 2010--Presents for yeast infection under both breasts and both sides of groin area x48months - red, painful, itching. Has used several creams but not going away. Has been prone to this in past. No fever, drainage or blisters. Denies chest pain, dyspnea, orthopnea, hemoptysis, fever, n/v/d, edema, headache.   May 02, 2010 Yeast infection and decubitus ulcer- both have improved.     June 26, 2010 --Returns for follow up and med review. Underwent endoscopy last week, essentially nml mucosa w/ stricture s/p dilatation. Tolerated well. We reviewed all her meds and updated her calendar. Husband helps with her meds and numbers her bottles to correlate with med calendar schedule.  August 24, 2010 --Presents for work in visit. Complains of sharp pains in lower back that radiates into both legs x42month, worse in the past 2 weeks.  also states area on right buttock is more inflammed x4-5days, has been treating w/ A&D ointment. Tender along low back. No extremity weakness or urinary symptoms. Denies chest pain, orthopnea, hemoptysis, fever, n/v/d, edema, headache.  Medications Prior to Update: 1)  Pepcid Ac Maximum Strength 20 Mg Tabs (Famotidine) .... One At Bedtime 2)  Estropipate 1.5 Mg Tabs (Estropipate) .... Take 1 Tablet By Mouth Once A Day 3)  Citrucel   Powd (Methylcellulose (Laxative)) .Marland Kitchen.. 1 Teaspoon By Mouth  Every Morning 4)  Ascriptin 325 Mg  Tabs (Aspirin Buf(Alhyd-Mghyd-Cacar)) .... Take 1 Tablet By Mouth Every Morning 5)  Nasonex 50  Mcg/act  Susp (Mometasone Furoate) .Marland Kitchen.. 1-2 Puffs Each Nostril  Every Morning and At Bedtime 6)  Amlodipine Besylate 5 Mg Tabs (Amlodipine Besylate) .... Take 1 Tablet By Mouth Once A Day 7)  Klor-Con M20 20 Meq Cr-Tabs (Potassium Chloride Crys Cr) .... 2 Tabs By Mouth  Every Morning 8)  Prozac 40 Mg  Caps (Fluoxetine Hcl) .Marland Kitchen.. 1 Once Daily 9)  Multivitamins   Tabs (Multiple Vitamin) .... Take 1 Tablet By Mouth Every Morning 10)  Protonix 40 Mg  Pack (Pantoprazole Sodium) .Marland Kitchen.. 1tab Once Daily 11)  Clonidine Hcl 0.1 Mg  Tabs (Clonidine Hcl) .... 1/2 Two Times A Day 12)  Aricept 10 Mg Tabs (Donepezil Hcl) .... Take 1 Tablet By Mouth Once A Day 13)  Oxybutynin Chloride 5 Mg  Tabs (Oxybutynin Chloride) .Marland Kitchen.. 1 Three Times A Day 14)  Namenda 10 Mg  Tabs (Memantine Hcl) .Marland Kitchen.. 1 Two Times A Day 15)  Promethazine Hcl 25 Mg  Tabs (Promethazine Hcl) .Marland Kitchen.. 1 Tab By Mouth Every 6 Hours As Needed 16)  Hyoscyamine Sulfate 0.125 Mg  Tabs (Hyoscyamine Sulfate) .Marland Kitchen.. 1-2 Every 6 Hours As Needed 17)  Alprazolam 0.25 Mg  Tabs (Alprazolam) .Marland Kitchen.. 1 Every 8 Hours As Needed 18)  Triamterene-Hctz 50-25 Mg  Caps (Triamterene-Hctz) .... **chart Said 75/25mg **  Once Daily As Needed 19)  Migrazone 325-65-100 Mg Caps (Apap-Isometheptene-Dichloral) .... 2 At Onset Then 1 Every 4 Hours As Needed 20)  Advil 200 Mg  Tabs (Ibuprofen) .... 3 Tabs W/ Meal Two Times A Day As Needed 21)  Hydrocodone-Acetaminophen 5-500 Mg Tabs (Hydrocodone-Acetaminophen) .Marland Kitchen.. 1 Tab Every 6 Hours As Needed 22)  Valtrex 1 Gm  Tabs (Valacyclovir Hcl) .... Once Dailyx 7 Days As Needed 23)  Drixoral Cold/allergy 6-120 Mg Xr12h-Tab (Dexbrompheniramine-Pseudoeph) .... Take 1 Tablet By Mouth Once A Day As Needed 24)  Afrin Sinus 0.05 % Soln (Oxymetazoline Hcl) .... Two Times A Day X5days Before Nasonex  Current Medications (verified): 1)  Pepcid Ac Maximum Strength 20 Mg Tabs (Famotidine) .... One At Bedtime 2)  Estropipate 1.5 Mg Tabs (Estropipate) .... Take  1 Tablet By Mouth Once A Day 3)  Citrucel   Powd (Methylcellulose (Laxative)) .Marland Kitchen.. 1 Teaspoon By Mouth  Every Morning 4)  Ascriptin 325 Mg  Tabs (Aspirin Buf(Alhyd-Mghyd-Cacar)) .... Take 1 Tablet By Mouth Every Morning 5)  Nasonex 50 Mcg/act  Susp (Mometasone Furoate) .Marland Kitchen.. 1-2 Puffs Each Nostril  Every Morning and At Bedtime 6)  Amlodipine Besylate 5 Mg Tabs (Amlodipine Besylate) .... Take 1 Tablet By Mouth Once A Day 7)  Klor-Con M20 20 Meq Cr-Tabs (Potassium Chloride Crys Cr) .... 2 Tabs By Mouth  Every Morning 8)  Prozac 40 Mg  Caps (Fluoxetine Hcl) .Marland Kitchen.. 1 Once Daily 9)  Multivitamins   Tabs (Multiple Vitamin) .... Take 1 Tablet By Mouth Every Morning 10)  Protonix 40 Mg  Pack (Pantoprazole Sodium) .Marland Kitchen.. 1tab Once Daily 11)  Clonidine Hcl 0.1 Mg  Tabs (Clonidine Hcl) .... 1/2 Two Times A Day 12)  Aricept 10 Mg Tabs (Donepezil Hcl) .... Take 1 Tablet By  Mouth Once A Day 13)  Oxybutynin Chloride 5 Mg  Tabs (Oxybutynin Chloride) .Marland Kitchen.. 1 Three Times A Day 14)  Namenda 10 Mg  Tabs (Memantine Hcl) .Marland Kitchen.. 1 Two Times A Day 15)  Promethazine Hcl 25 Mg  Tabs (Promethazine Hcl) .Marland Kitchen.. 1 Tab By Mouth Every 6 Hours As Needed 16)  Hyoscyamine Sulfate 0.125 Mg  Tabs (Hyoscyamine Sulfate) .Marland Kitchen.. 1-2 Every 6 Hours As Needed 17)  Alprazolam 0.25 Mg  Tabs (Alprazolam) .Marland Kitchen.. 1 Every 8 Hours As Needed 18)  Triamterene-Hctz 50-25 Mg  Caps (Triamterene-Hctz) .... **chart Said 75/25mg **  Once Daily As Needed 19)  Migrazone 325-65-100 Mg Caps (Apap-Isometheptene-Dichloral) .... 2 At Onset Then 1 Every 4 Hours As Needed 20)  Advil 200 Mg  Tabs (Ibuprofen) .... 3 Tabs W/ Meal Two Times A Day As Needed 21)  Hydrocodone-Acetaminophen 5-500 Mg Tabs (Hydrocodone-Acetaminophen) .Marland Kitchen.. 1 Tab Every 6 Hours As Needed 22)  Drixoral Cold/allergy 6-120 Mg Xr12h-Tab (Dexbrompheniramine-Pseudoeph) .... Take 1 Tablet By Mouth Once A Day As Needed 23)  Afrin Sinus 0.05 % Soln (Oxymetazoline Hcl) .... Two Times A Day X5days Before  Nasonex  Allergies (verified): 1)  ! Pcn  Past History:  Past Medical History: Last updated: 06/26/2010 COUGH (ICD-786.2) DEPRESSION (ICD-311) ANXIETY, CHRONIC (ICD-300.00) COLONIC POLYPS (ICD-211.3) DIVERTICULITIS, COLON (ICD-562.11)    - colonoscopy 03/21/2004 HYPERLIPIDEMIA, MIXED (ICD-272.2) BARRETT'S ESOPHAGUS, HX OF (ICD-V12.79).........................[facility]   - EGD 03/21/2004 OBESITY (ICD-278.00)   - Target wt  =   < 158  for BMI < 30  UTI'S, CHRONIC (ICD-599.0)..........................................................Marland KitchenMarland KitchenPeterson OSTEOARTHRITIS (ICD-715.90) ASTHMATIC BRONCHITIS, ACUTE (ICD-466.0) DEMENTIA, MILD (ICD-294.8) HYPERTENSION (ICD-401.9) RHINITIS (ICD-472.0) Sacral Decubitus...........................................................................Marland KitchenDermatology-- [facility] [place] [facility]  Skin Candida--breast/groin --tx w/ diflucan 8/26, rx lotrisone COMPLEX MED REGIMEN ---.Meds reviewed with pt education and computerized med calendar completed/adjusted-08/19/2008, June 26, 2010   Past Surgical History: Last updated: 05/22/2010 Cholecystectomy Total Hip Arthroplasty (bilat) Hysterectomy Rt rotator cuff surgery   Family History: Last updated: 05/22/2010 Family History of Breast Cancer:Mother, Daughter Rectal/Colon Cancer: Mother   Social History: Last updated: 05/22/2010 Occupation: Housewife Married Patient smoked only as teenager Alcohol Use - no Illicit Drug Use - no  Risk Factors: Smoking Status: never (01/22/2008)  Review of Systems      See HPI  Vital Signs:  Patient profile:   77 year old female Height:      62 inches Weight:      151 pounds BMI:     27.72 O2 Sat:      96 % on Room air Temp:     96.9 degrees F oral Pulse rate:   88 / minute BP sitting:   130 / 78  (left arm) Cuff size:   regular  Vitals Entered By: Boone Master CNA/MA (August 24, 2010 11:21 AM)  O2 Flow:  Room air CC: sharp  pains in lower back that radiates into both legs x16month, worse in the past 2 weeks.  also states area on right buttock is more inflammed x4-5days, has been treating w/ A&D ointment Is Patient Diabetic? No Comments Medications reviewed with patient Daytime contact number verified with patient. Boone Master CNA/MA  August 24, 2010 11:23 AM    Physical Exam  Additional Exam:  obese chronically ill appearing   wheelchair bound   wt  175 August 16, 2009 > 165 October 24, 2009  > 156 January 04, 2010 >>159>>155 03/10/10 . 148 May 02, 2010 >>December12, 2011 151>151 August 24, 2010   HEENT:  nl dentition, turbinates, and orophanx. Nl external ear canals without cough reflex NECK :  without JVD/Nodes/TM/ nl carotid upstrokes bilaterally LUNGS: no acc muscle use, clear to A and P bilaterally without cough on insp or exp maneuvers CV:  RRR  no s3 or murmur or increase in P2, no edema   ABD:  soft and nontender with nl excursion in the supine position. No bruits or organomegaly, bowel sounds nl MS:  warm without deformities, calf tenderness, cyanosis or clubbing tender along lumbar/sacral region,no deformity or eccymosis noted. NEURO:  alert no focal deficits Skin: no rash, sacral irriatiation  ~stg 1-2 no significant drainage    Impression & Recommendations:  Problem # 1:  BACK PAIN (ICD-724.5)  Suspect DJD  xray  pending  Plan; Alternate ice and heat  to low back Aleve two times a day with food for 5 days Vicodin as needed pain 1/2-1 tabs every 6 hr as needed pain. Continue to clean ulcer on back , apply ointment daily  Avoid pressure on buttocks, rotate sides while sitting and lying.  follow up Dr. Sherene Sires in 1 month  Please contact office for sooner follow up if symptoms do not improve or worsen   Orders: T-Lumbar Spine 2 Views (72100TC) TLB-Udip w/ Micro (81001-URINE) T-Urine Culture (Spectrum Order) ([id number]) Est. Patient Level IV (62130)  Problem # 2:  DECUBITUS ULCER,  SACRUM (ICD-707.03)  a/d ointment and avoid pressure to area if possible.   Orders: Est. Patient Level IV (86578)  Patient Instructions: 1)  Alternate ice and heat  to low back 2)  Aleve two times a day with food for 5 days 3)  Vicodin as needed pain 1/2-1 tabs every 6 hr as needed pain. 4)  Continue to clean ulcer on back , apply ointment daily  5)  Avoid pressure on buttocks, rotate sides while sitting and lying.  6)  follow up Dr. Sherene Sires in 1 month  7)  Please contact office for sooner follow up if symptoms do not improve or worsen

## 2010-09-22 ENCOUNTER — Encounter: Payer: Self-pay | Admitting: Internal Medicine

## 2010-09-22 ENCOUNTER — Ambulatory Visit (INDEPENDENT_AMBULATORY_CARE_PROVIDER_SITE_OTHER): Payer: Medicare Other | Admitting: Internal Medicine

## 2010-09-22 DIAGNOSIS — M549 Dorsalgia, unspecified: Secondary | ICD-10-CM

## 2010-09-22 DIAGNOSIS — K219 Gastro-esophageal reflux disease without esophagitis: Secondary | ICD-10-CM

## 2010-09-22 DIAGNOSIS — I10 Essential (primary) hypertension: Secondary | ICD-10-CM

## 2010-09-22 DIAGNOSIS — M1990 Unspecified osteoarthritis, unspecified site: Secondary | ICD-10-CM

## 2010-10-03 NOTE — Assessment & Plan Note (Signed)
Summary: Pulmonary/ ext ov with med calendar review   Copy to:  na Primary Provider/Referring Provider:  Sandrea Hughs, MD  CC:  Back pain- improved.  History of Present Illness: 77  yowf with minimal smoking hx  with morbid obesity who has a known history of asthmatic bronchitis, hypertension, osteoarthritis and mild dementia.  7/910--Presents for an acute office visit. Complains of pain in right side, states goes around to the right flank area onset today.   This am was moderate, better after lunch. No urinary symptoms, Denies chest pain, dyspnea, orthopnea, hemoptysis, fever, n/v/d, edema, headache. No otc used. no food association. Has had intermittent abd pain in past , comes and goes. sometimes has gas, bloating.   June 27, 2009--Complains of sinus pressure/congestion with green/yellow mucus, PND x1week, poss UTI with slight burning and frequency x1week, rawness/redness x2days and has a red spot in outer right calf that does not itch/burn x >10month. Accompanied by husband and daughter today.  August 16, 2009 ov for surgical clearance  very sedentary, rarely goes out except to eat. last walked at a department store maybe a month ago. not doing any mentally challenging activities either. however,     10/24/09--CPX  October 31, 2009--Presents for 1 week follow up and med review. We reviewed meds today and updated her med calendar. She still does not feel good, w/ decreased appetite, labs were essentially unremarkable except for low K+   January 04, 2010 ov c/o decrease in appetite over the past month.  She also c/o some occ difficulty with swallowing.  Has gerd per GI (Dr Doreatha Martin) denies non adherence with rx as outlined in med calendar and inventoried accurately under present meds.  sore on sacrum no better, using donut to sit.  Urgency also but no dysuria.   --stopped lasix, pepcid added, decreased k-dur.     February 07, 2010 -returns for follow up 4 week follow up - states appetite and  swallowing have improved. Last visit Pepcid added. She is going to wound care center in [facility] for sacral decub. SHe has quite abit of pain from this. Area is excoriated. and sensitive. She is taking her meds well. Husband is here with her today, he helps her with daily care.   March 14, 2010--Presents for yeast infection under both breasts and both sides of groin area x66months - red, painful, itching. Has used several creams but not going away. Has been prone to this in past. No fever, drainage or blisters. Denies chest pain, dyspnea, orthopnea, hemoptysis, fever, n/v/d, edema, headache.   May 02, 2010 Yeast infection and decubitus ulcer- both have improved.     June 26, 2010 --Returns for follow up and med review. Underwent endoscopy last week, essentially nml mucosa w/ stricture s/p dilatation. Tolerated well. We reviewed all her meds and updated her calendar. Husband helps with her meds and numbers her bottles to correlate with med calendar schedule.  August 24, 2010 --Presents for work in visit. Complains of sharp pains in lower back that radiates into both legs x87month, worse in the past 2 weeks.  also states area on right buttock is more inflammed x4-5days, has been treating w/ A&D ointment. Tender along low back. No extremity weakness or urinary symptoms. rec aleve and vicodin and continued topical rx for decubitus  September 22, 2010 ov "I'm no better" but per husband her only complaint is back pain and she rarely uses the as needed advil she has on her list and rarlely  the vicodin.  Pt denies any significant sore throat, dysphagia, itching, sneezing,  nasal congestion or excess secretions,  fever, chills, sweats, unintended wt loss, pleuritic or exertional cp, hempoptysis, change in activity tolerance  orthopnea pnd or leg swelling    Current Medications (verified): 1)  Pepcid Ac Maximum Strength 20 Mg Tabs (Famotidine) .... One At Bedtime 2)  Estropipate 1.5 Mg Tabs (Estropipate)  .... Take 1 Tablet By Mouth Once A Day 3)  Citrucel   Powd (Methylcellulose (Laxative)) .Marland Kitchen.. 1 Teaspoon By Mouth  Every Morning 4)  Ascriptin 325 Mg  Tabs (Aspirin Buf(Alhyd-Mghyd-Cacar)) .... Take 1 Tablet By Mouth Every Morning 5)  Nasonex 50 Mcg/act  Susp (Mometasone Furoate) .Marland Kitchen.. 1-2 Puffs Each Nostril  Every Morning and At Bedtime 6)  Amlodipine Besylate 5 Mg Tabs (Amlodipine Besylate) .... Take 1 Tablet By Mouth Once A Day 7)  Klor-Con M20 20 Meq Cr-Tabs (Potassium Chloride Crys Cr) .... 2 Tabs By Mouth  Every Morning 8)  Prozac 40 Mg  Caps (Fluoxetine Hcl) .Marland Kitchen.. 1 Once Daily 9)  Multivitamins   Tabs (Multiple Vitamin) .... Take 1 Tablet By Mouth Every Morning 10)  Protonix 40 Mg  Pack (Pantoprazole Sodium) .Marland Kitchen.. 1tab Once Daily 11)  Clonidine Hcl 0.1 Mg  Tabs (Clonidine Hcl) .... 1/2 Two Times A Day 12)  Aricept 10 Mg Tabs (Donepezil Hcl) .... Take 1 Tablet By Mouth Once A Day 13)  Oxybutynin Chloride 5 Mg  Tabs (Oxybutynin Chloride) .Marland Kitchen.. 1 Three Times A Day 14)  Namenda 10 Mg  Tabs (Memantine Hcl) .Marland Kitchen.. 1 Two Times A Day 15)  Promethazine Hcl 25 Mg  Tabs (Promethazine Hcl) .Marland Kitchen.. 1 Tab By Mouth Every 6 Hours As Needed 16)  Hyoscyamine Sulfate 0.125 Mg  Tabs (Hyoscyamine Sulfate) .Marland Kitchen.. 1-2 Every 6 Hours As Needed 17)  Alprazolam 0.25 Mg  Tabs (Alprazolam) .Marland Kitchen.. 1 Every 8 Hours As Needed 18)  Triamterene-Hctz 50-25 Mg  Caps (Triamterene-Hctz) .... **chart Said 75/25mg **  Once Daily As Needed 19)  Migrazone 325-65-100 Mg Caps (Apap-Isometheptene-Dichloral) .... 2 At Onset Then 1 Every 4 Hours As Needed 20)  Advil 200 Mg  Tabs (Ibuprofen) .... 3 Tabs W/ Meal Two Times A Day As Needed 21)  Hydrocodone-Acetaminophen 5-500 Mg Tabs (Hydrocodone-Acetaminophen) .Marland Kitchen.. 1 Tab Every 6 Hours As Needed 22)  Drixoral Cold/allergy 6-120 Mg Xr12h-Tab (Dexbrompheniramine-Pseudoeph) .... Take 1 Tablet By Mouth Once A Day As Needed 23)  Afrin Sinus 0.05 % Soln (Oxymetazoline Hcl) .... Two Times A Day X5days Before  Nasonex  Allergies (verified): 1)  ! Pcn  Past History:  Past Medical History: COUGH (ICD-786.2) DEPRESSION (ICD-311) ANXIETY, CHRONIC (ICD-300.00) COLONIC POLYPS (ICD-211.3) DIVERTICULITIS, COLON (ICD-562.11)    - colonoscopy 03/21/2004 HYPERLIPIDEMIA, MIXED (ICD-272.2) BARRETT'S ESOPHAGUS, HX OF (ICD-V12.79)..............................[facility]   - EGD 03/21/2004 OBESITY (ICD-278.00)   - Target wt  =   < 158  for BMI < 30  UTI'S, CHRONIC (ICD-599.0)...........................................................Marland KitchenMarland KitchenPeterson OSTEOARTHRITIS (ICD-715.90) ASTHMATIC BRONCHITIS, ACUTE (ICD-466.0) DEMENTIA, MILD (ICD-294.8) HYPERTENSION (ICD-401.9) RHINITIS (ICD-472.0) Sacral Decubitus...........................................................................Marland KitchenDermatology-- -[facility] [place] [facility]  Skin Candida--breast/groin --tx w/ diflucan 8/26, rx lotrisone COMPLEX MED REGIMEN ---.Meds reviewed with pt education and computerized med calendar completed/adjusted-08/19/2008, June 26, 2010   Vital Signs:  Patient profile:   77 year old female Weight:      146 pounds O2 Sat:      97 % on Room air Temp:     97.4 degrees F oral Pulse rate:   74 / minute BP sitting:  102 / 62  (left arm)  Vitals Entered By: Vernie Murders (September 22, 2010 11:42 AM)  O2 Flow:  Room air  Physical Exam  Additional Exam:  obese chronically ill appearing   wheelchair bound   wt  175 August 16, 2009 > 165 October 24, 2009  >   146 September 24, 2010   HEENT: nl dentition, turbinates, and orophanx. Nl external ear canals without cough reflex NECK :  without JVD/Nodes/TM/ nl carotid upstrokes bilaterally LUNGS: no acc muscle use, clear to A and P bilaterally without cough on insp or exp maneuvers CV:  RRR  no s3 or murmur or increase in P2, no edema   ABD:  soft and nontender with nl excursion in the supine position. No bruits or organomegaly, bowel sounds nl MS:  warm without  deformities, calf tenderness, cyanosis or clubbing tender along lumbar/sacral region,no deformity or eccymosis noted.       Impression & Recommendations:  Problem # 1:  BACK PAIN (ICD-724.5)  Mutifactorial, somewhat Better but not consistently following her med calendar  I emphasized to the patient that just like Dorothy in [employer], she is already wearing "ruby slippers" she can click anytime she wants:  the answer to all her recurrent symptoms  is already  literally at her fingertips:   all she has to do is refer to the medicine calendar we provided her  and her problems  are each addressed in a user-friendly format, reading from left to right for each symptoms she's likely to develop between office visits.     Each maintenance medication was reviewed in detail including most importantly the difference between maintenance and as needed and under what circumstances the prns are to be used. This was done in the context of a medication calendar review which provided the patient with a user-friendly unambiguous mechanism for medication administration and reconciliation and provides an action plan for all active problems. It is critical that this be shown to every doctor  for modification during the office visit if necessary so the patient can use it as a working document.   Orders: Est. Patient Level IV (16109)  Problem # 2:  GERD (ICD-530.81)  Her updated medication list for this problem includes:    Pepcid Ac Maximum Strength 20 Mg Tabs (Famotidine) ..... One at bedtime    Protonix 40 Mg Pack (Pantoprazole sodium) .Marland Kitchen... 1tab once daily    Hyoscyamine Sulfate 0.125 Mg Tabs (Hyoscyamine sulfate) .Marland Kitchen... 1-2 every 6 hours as needed  Orders: Est. Patient Level IV (60454)  Problem # 3:  OSTEOARTHRITIS (ICD-715.90)  Her updated medication list for this problem includes:    Ascriptin 325 Mg Tabs (Aspirin buf(alhyd-mghyd-cacar)) .Marland Kitchen... Take 1 tablet by mouth every morning    Advil 200  Mg Tabs (Ibuprofen) .Marland KitchenMarland KitchenMarland KitchenMarland Kitchen 3 tabs w/ meal two times a day as needed    Hydrocodone-acetaminophen 5-500 Mg Tabs (Hydrocodone-acetaminophen) .Marland Kitchen... 1 tab every 6 hours as needed  Orders: Est. Patient Level IV (09811)  Problem # 4:  HYPERTENSION (ICD-401.9)  ok on rx  Her updated medication list for this problem includes:    Amlodipine Besylate 5 Mg Tabs (Amlodipine besylate) .Marland Kitchen... Take 1 tablet by mouth once a day    Clonidine Hcl 0.1 Mg Tabs (Clonidine hcl) .Marland Kitchen... 1/2 two times a day    Triamterene-hctz 50-25 Mg Caps (Triamterene-hctz) .Marland Kitchen... **chart said 75/25mg **  once daily as needed  Orders: Est. Patient Level IV (91478)  Patient Instructions: 1)  See calendar for specific medication instructions and bring it back for each and every office visit for every healthcare provider you see.  Without it,  you may not receive the best quality medical care that we feel you deserve.  2)  Remember Nicole Cella and the red slippers 3)  Please schedule a follow-up appointment in 6 weeks, sooner if needed with [facility]

## 2010-10-04 ENCOUNTER — Telehealth: Payer: Self-pay | Admitting: Internal Medicine

## 2010-10-04 NOTE — Telephone Encounter (Signed)
Called, spoke with pt's husband.  States pt has an open area on her tail bone for over 1 wk.  Area is very painful and approx 3/4 inch in size.  No openings with Dr. Sherene Sires or Tammy P.  Until April.  Allergies - PCN.  [facility] [address].  Dr. Sherene Sires, pls advise. Thanks!

## 2010-10-04 NOTE — Telephone Encounter (Signed)
This is a nursing issue that the visiting nurse needs to evaluate and treat, nothing can be done over the phone through this office

## 2010-10-04 NOTE — Telephone Encounter (Signed)
Spoke with pt's spouse.  He states that no home health nurse is needed now b/c he looked at the rash a little while ago and it has cleared up.  I advised to call back if needed and he is fine with this plan.

## 2010-10-06 LAB — BASIC METABOLIC PANEL
BUN: 9 mg/dL (ref 6–23)
GFR calc Af Amer: >60 mL/min (ref >60)
GFR calc non Af Amer: 52 mL/min — ABNORMAL LOW (ref >60)
Potassium: 3.2 mEq/L — ABNORMAL LOW (ref 3.5–5.1)

## 2010-10-08 ENCOUNTER — Other Ambulatory Visit: Payer: Self-pay | Admitting: Adult Health

## 2010-10-09 LAB — BASIC METABOLIC PANEL
BUN: 10 mg/dL (ref 6–23)
Creatinine, Ser: 1.16 mg/dL (ref 0.4–1.2)
GFR calc Af Amer: 55 mL/min — ABNORMAL LOW (ref >60)
GFR calc non Af Amer: 45 mL/min — ABNORMAL LOW (ref >60)

## 2010-10-09 LAB — POCT HEMOGLOBIN-HEMACUE: Hemoglobin: 14.9 g/dL (ref 12.0–15.0)

## 2010-10-11 ENCOUNTER — Other Ambulatory Visit (INDEPENDENT_AMBULATORY_CARE_PROVIDER_SITE_OTHER): Payer: Medicare Other

## 2010-10-11 ENCOUNTER — Encounter: Payer: Self-pay | Admitting: Internal Medicine

## 2010-10-11 ENCOUNTER — Ambulatory Visit (INDEPENDENT_AMBULATORY_CARE_PROVIDER_SITE_OTHER): Payer: Medicare Other | Admitting: Internal Medicine

## 2010-10-11 ENCOUNTER — Telehealth: Payer: Self-pay | Admitting: Internal Medicine

## 2010-10-11 ENCOUNTER — Other Ambulatory Visit: Payer: Medicare Other

## 2010-10-11 ENCOUNTER — Other Ambulatory Visit: Payer: Self-pay | Admitting: Internal Medicine

## 2010-10-11 VITALS — BP 100/78 | HR 81 | Temp 98.1°F | Ht 63.0 in

## 2010-10-11 DIAGNOSIS — R112 Nausea with vomiting, unspecified: Secondary | ICD-10-CM

## 2010-10-11 DIAGNOSIS — E876 Hypokalemia: Secondary | ICD-10-CM

## 2010-10-11 DIAGNOSIS — R42 Dizziness and giddiness: Secondary | ICD-10-CM

## 2010-10-11 DIAGNOSIS — I10 Essential (primary) hypertension: Secondary | ICD-10-CM

## 2010-10-11 LAB — CBC WITH DIFFERENTIAL/PLATELET
Basophils Absolute: 0 10*3/uL (ref 0.0–0.1)
Lymphocytes Relative: 24.3 % (ref 12.0–46.0)
Lymphs Abs: 2.1 10*3/uL (ref 0.7–4.0)
Monocytes Relative: 4.9 % (ref 3.0–12.0)
Neutrophils Relative %: 65.4 % (ref 43.0–77.0)
Platelets: 202 10*3/uL (ref 150.0–400.0)
RDW: 15.5 % — ABNORMAL HIGH (ref 11.5–14.6)
WBC: 8.7 10*3/uL (ref 4.5–10.5)

## 2010-10-11 LAB — BASIC METABOLIC PANEL
CO2: 25 mEq/L (ref 19–32)
Calcium: 9.7 mg/dL (ref 8.4–10.5)
Chloride: 107 mEq/L (ref 96–112)
Potassium: 4.5 mEq/L (ref 3.5–5.1)
Sodium: 143 mEq/L (ref 135–145)

## 2010-10-11 NOTE — Assessment & Plan Note (Addendum)
overtreated at present ? Contributing to dizziness so try off amlodipine    Each maintenance medication was reviewed in detail including most importantly the difference between maintenance and as needed and under what circumstances the prns are to be used.  See calendar for specific medication instructions and bring it back for each and every office visit for every healthcare provider you see.  Without it,  you may not receive the best quality medical care that we feel you deserve.  You will note that the calendar groups together  your maintenance  medications that are timed at particular times of the day.  Think of this as your checklist for what your doctor has instructed you to do until your next evaluation to see what benefit  there is  to staying on a consistent group of medications intended to keep you well.  The other group at the bottom is entirely up to you to use as you see fit  for specific symptoms that may arise between visits that require you to treat them on an as needed basis.  Think of this as your action plan or "what if" list.   Separating the top medications from the bottom group is fundamental to providing you adequate care going forward.

## 2010-10-11 NOTE — Telephone Encounter (Signed)
Spoke with pt husband and he states pt is having a lot of dizziness, lightheaded, is having a frontal headache x 4 days, Pt also having terrible time with her allergies. Pt is coming in to see Dr. Sherene Sires at 10:30 this morning to be evaluated  Carver Fila, CMA

## 2010-10-11 NOTE — Patient Instructions (Signed)
Stop amlodipine  For dizzy try alprazolam per  calendendar

## 2010-10-11 NOTE — Progress Notes (Signed)
Subjective:    Patient ID: [name], female    DOB: [birth year], 77 y.o.   MRN: [id number]  HPI80 yowf with minimal smoking hx with morbid obesity and  history of asthmatic bronchitis, hypertension, osteoarthritis and mild dementia.   August 16, 2009 ov for surgical clearance very sedentary, rarely goes out except to eat. last walked at a department store maybe a month ago. not doing any mentally challenging activities either. however,     October 31, 2009-ov  follow up and med review. We reviewed meds today and updated her med calendar. She still does not feel good, w/ decreased appetite, labs were essentially unremarkable except for low K+   January 04, 2010 ov c/o decrease in appetite over the past month. She also c/o some occ difficulty with swallowing. Has gerd per GI (Dr Doreatha Martin) denies non adherence with rx as outlined in med calendar and inventoried accurately under present meds. sore on sacrum no better, using donut to sit. Urgency also but no dysuria. --stopped lasix, pepcid added, decreased k-dur.   February 07, 2010 -returns for follow up 4 week follow up - states appetite and swallowing have improved. Last visit Pepcid added. She is going to wound care center in [facility] for sacral decub.      September 22, 2010 ov "I'm no better" but per husband her only complaint is back pain and she rarely uses the as needed advil she has on her list and rarlely the vicodin.   rec follow med calendar > did not increase pain meds as rec per calendar but "pain better" per husband.  10/11/2010   ov cc dizzy not nauseated better lying down, poor appetite, no vomiting, fever, diarrhea. .  Pt denies any significant sore throat, dysphagia, itching, sneezing,  nasal congestion or excess/ purulent secretions,  fever, chills, sweats, unintended wt loss, pleuritic or exertional cp, hempoptysis, orthopnea pnd or leg swelling.    Also denies any obvious fluctuation of symptoms with weather or environmental changes or  other aggravating or alleviating factors.     Past Medical History:  COUGH (ICD-786.2)  DEPRESSION (ICD-311)  ANXIETY, CHRONIC (ICD-300.00)  COLONIC POLYPS (ICD-211.3)  DIVERTICULITIS, COLON (ICD-562.11)  - colonoscopy 03/21/2004  HYPERLIPIDEMIA, MIXED (ICD-272.2)  BARRETT'S ESOPHAGUS, HX OF (ICD-V12.79).........................[facility]  - EGD 03/21/2004  OBESITY (ICD-278.00)  - Target wt = < 158 for BMI < 30  UTI'S, CHRONIC (ICD-599.0)...........................................................[facility]  OSTEOARTHRITIS (ICD-715.90)  ASTHMATIC BRONCHITIS, ACUTE (ICD-466.0)  DEMENTIA, MILD (ICD-294.8)  HYPERTENSION (ICD-401.9)  RHINITIS (ICD-472.0)  Sacral Decubitus...........................................................................[facility]  -[facility] [place] 7/11  Skin Candida--breast/groin  --tx w/ diflucan 8/26, rx lotrisone  COMPLEX MED REGIMEN  ---.Meds reviewed with pt education and computerized med calendar completed/adjusted-08/19/2008, June 26, 2010  Vital Signs:        Review of Systems     Objective:   Physical Exam Exam: obese chronically ill appearing wheelchair bound  wt 175 August 16, 2009 > 165 October 24, 2009 > 146 September 24, 2010 > 10/11/2010 refused wt HEENT: nl dentition, turbinates, and orophanx. Nl external ear canals without cough reflex  NECK : without JVD/Nodes/TM/ nl carotid upstrokes bilaterally  LUNGS: no acc muscle use, clear to A and P bilaterally without cough on insp or exp maneuvers  CV: RRR no s3 or murmur or increase in P2, no edema  ABD: soft and nontender with nl excursion in the supine position. No bruits or organomegaly, bowel sounds nl  MS: warm without deformities, calf tenderness,  cyanosis or clubbing  tender along lumbar/sacral region,no deformity or eccymosis noted.       Assessment & Plan:

## 2010-10-12 ENCOUNTER — Encounter: Payer: Self-pay | Admitting: Internal Medicine

## 2010-10-12 DIAGNOSIS — R42 Dizziness and giddiness: Secondary | ICD-10-CM | POA: Insufficient documentation

## 2010-10-12 NOTE — Progress Notes (Signed)
Quick Note:  Spoke with pt's spouse and notified of results/recs per Dr. Sherene Sires. He verbalized understanding. ______

## 2010-10-12 NOTE — Assessment & Plan Note (Signed)
Better lying down with low bp sitting and standing so likely related to meds.  No nausea, no brainstem complaints or findings. Nl bmet and cbc

## 2010-11-07 ENCOUNTER — Encounter: Payer: Self-pay | Admitting: Adult Health

## 2010-11-08 ENCOUNTER — Telehealth: Payer: Self-pay | Admitting: Internal Medicine

## 2010-11-08 NOTE — Telephone Encounter (Signed)
This is an FYI regarding pts med being approved-insurance will send letter to patient and [employer] for pt to get meds.

## 2010-11-09 ENCOUNTER — Encounter: Payer: Self-pay | Admitting: Adult Health

## 2010-11-09 ENCOUNTER — Ambulatory Visit (INDEPENDENT_AMBULATORY_CARE_PROVIDER_SITE_OTHER): Payer: Medicare Other | Admitting: Adult Health

## 2010-11-09 DIAGNOSIS — R42 Dizziness and giddiness: Secondary | ICD-10-CM

## 2010-11-09 DIAGNOSIS — I10 Essential (primary) hypertension: Secondary | ICD-10-CM

## 2010-11-09 DIAGNOSIS — F068 Other specified mental disorders due to known physiological condition: Secondary | ICD-10-CM

## 2010-11-09 NOTE — Patient Instructions (Signed)
Continue on same meds.  follow up Dr. Sherene Sires  In 3 months  May use [place]in under skin folds.  May use Ensure between meals if she is not eating good.

## 2010-11-14 NOTE — Assessment & Plan Note (Signed)
bp control adequate off norvasc Plan Cont current regimen

## 2010-11-14 NOTE — Assessment & Plan Note (Signed)
Suspect progession of dementia w/ decreased interest in activities and decreasd appetite Advised on diet , may add ensure if needed Cont on current reigmen.  No sign of delirium or infection  Cont to monitor

## 2010-11-14 NOTE — Assessment & Plan Note (Signed)
Improved with discontinuation of novasc  Suspect side effect of overcompensation of b/p  Plan Cont off norvasc.  Fluid intake

## 2010-11-14 NOTE — Progress Notes (Signed)
Subjective:    Patient ID: [name], female    DOB: 10-May-1931, 77 y.o.   MRN: [id number]  HPI 28 yowf with minimal smoking hx with morbid obesity and  history of asthmatic bronchitis, hypertension, osteoarthritis and mild dementia.   August 16, 2009 ov for surgical clearance very sedentary, rarely goes out except to eat. last walked at a department store maybe a month ago. not doing any mentally challenging activities either. however,     October 31, 2009-ov  follow up and med review. We reviewed meds today and updated her med calendar. She still does not feel good, w/ decreased appetite, labs were essentially unremarkable except for low K+   January 04, 2010 ov c/o decrease in appetite over the past month. She also c/o some occ difficulty with swallowing. Has gerd per GI (Dr Doreatha Martin) denies non adherence with rx as outlined in med calendar and inventoried accurately under present meds. sore on sacrum no better, using donut to sit. Urgency also but no dysuria. --stopped lasix, pepcid added, decreased k-dur.   February 07, 2010 -returns for follow up 4 week follow up - states appetite and swallowing have improved. Last visit Pepcid added. She is going to wound care center in [facility] for sacral decub.      September 22, 2010 ov "I'm no better" but per husband her only complaint is back pain and she rarely uses the as needed advil she has on her list and rarlely the vicodin.   rec follow med calendar > did not increase pain meds as rec per calendar but "pain better" per husband.  3/28/12ov cc dizzy not nauseated better lying down, poor appetite, no vomiting, fever, diarrhea.>>norvasc stopped , labs done -not remarkable  11/09/10 Follow up  Pt returns with her husband for follow up . Seen last ov for intermittent dizziness. Her Norvasc was stopped with supsected  Side effects and over compensated b/p control. She says she feels better dizziness is almost entirely gone. Has had only a few episodes since  last ov.  She says she is tired today and is ready to go back home. Husband says she is becoming more and more sedentary and does not want to go out anywhere. Her appetite remains low and weight is trending down over last year.Labs done with cbc and bmet last ov were unrevealing to explain dizziness or weight loss. TSH in last year ok.   Remains on aricept and namenda for dementia. Husband says her memory comes and goes with intermittent confusion, no flare in symptoms. No urinary symptoms. No abd pain or n/v/d.   Does complains of yeast under breast and skin folds. Has had problems with this in past. Is out of cream used previously.       Review of Systems Constitutional:   +  weight loss, no night sweats,  Fevers, chills,  +fatigue, and lassitude.  HEENT:   No headaches,  Difficulty swallowing,  Tooth/dental problems, or  Sore throat,                No sneezing, itching, ear ache, nasal congestion, post nasal drip,   CV:  No chest pain,  Orthopnea, PND, swelling in lower extremities, anasarca, , palpitations, syncope.   GI  No heartburn, indigestion, abdominal pain, nausea, vomiting, diarrhea, change in bowel habits, loss of appetite, bloody stools.   Resp: No shortness of breath with exertion or at rest.  No excess mucus, no productive cough,  No non-productive cough,  No coughing up of blood.  No change in color of mucus.  No wheezing.  No chest wall deformity  Skin: no rash or lesions.  GU: no dysuria, change in color of urine, no urgency or frequency.  No flank pain, no hematuria   MS:  No joint pain or swelling.  No decreased range of motion.   o memory loss  Psych: + change in mood or affect.  +memory issues          Objective:   Physical Exam GEN: A/Ox2 ;  NAD, flat affect   HEENT:  [place]/AT,  EACs-clear, TMs-wnl, NOSE-clear, THROAT-clear, no lesions, no postnasal drip or exudate noted.   NECK:  Supple w/ fair ROM; no JVD; normal carotid impulses w/o bruits; no  thyromegaly or nodules palpated; no lymphadenopathy.  RESP  Clear  P & A; w/o, wheezes/ rales/ or rhonchi.no accessory muscle use, no dullness to percussion  CARD:  RRR, no m/r/g  , no peripheral edema, pulses intact, no cyanosis or clubbing.  GI:   Soft & nt; nml bowel sounds; no organomegaly or masses detected.  Musco: Warm bil, no deformities or joint swelling noted.   Neuro: alert, no focal deficits noted.  Alert to person and place. Confused to date   Skin: Warm, no lesions or rashes           Assessment & Plan:

## 2010-11-28 NOTE — Assessment & Plan Note (Signed)
[facility]                             PULMONARY OFFICE NOTE   NAME:Deviney, DANILLE OPPEDISANO                      MRN:          [id number]  DATE:05/14/2007                            DOB:          03-04-1931    PRIMARY SERVICE/ACUTE OFFICE EVALUATION:  A 77 year old white female  with a history of both reflux and rhinitis with frequent exacerbations  of cough, who returns now for increasing cough over the last week  associated with slightly yellowish mucus production and a sensation of  too much throat drainage.  As instructed, she says she took Afrin for  5 days for the acute exacerbation to be used immediately before Nasonex  but says it did not help.  She was not able to take Drixoral Cold and  Allergy because she could not find it, and note that it has been listed  as a p.r.n. to take for excess drainage.   For a full inventory of medications, please see face sheet column dated  May 14, 2007.   PHYSICAL EXAMINATION:  She is an obese, ambulatory white female in no  acute distress.  She had stable vital signs.  HEENT:  Unremarkable.  Pharynx is clear.  There is no evidence of  excessive postnasal drainage or cobblestoning.  NECK:  Supple without cervical adenopathy or tenderness.  Trachea is  midline.  No thyromegaly.  Lung fields completely clear bilaterally to auscultation and percussion.  CARDIAC:  Regular rate and rhythm without murmur, gallop, or rub.  ABDOMEN:  Soft, benign.  EXTREMITIES:  Warm without calf tenderness, cyanosis, clubbing or edema.   IMPRESSION:  Acute on chronic rhinitis with sensation of postnasal drip  syndrome with minimally discolored sputum.  I am going to give her a 10-  day course of doxycycline empirically but I am unimpressed that she has  significant excess mucus drainage and suspect again that this is  nonspecific rhinitis and/or reflux that should be adequately controlled  on her present regimen.  If she does not  respond to this, the next step  would be to do a sinus CT scan and add Reglan to her regimen.   In the meantime, for symptomatic control of excess mucus drainage since  Drixoral Cold and Allergy is no longer available, I recommended Chlor-  [place] over-the-counter for this purpose and added it to the  medication calendar.   Follow-up will be in 6 weeks, sooner if needed.     Charlaine Dalton. Sherene Sires, MD, [facility]  Electronically Signed    MBW/MedQ  DD: 05/14/2007  DT: 05/15/2007  Job #: [id number]

## 2010-11-28 NOTE — Assessment & Plan Note (Signed)
[facility]                             PULMONARY OFFICE NOTE   NAME:[name], [name]                      MRN:          [id number]  DATE:06/23/2007                            DOB:          28-Mar-1931    HISTORY OF PRESENT ILLNESS:  The patient is a 77 year old white female  patient of Dr. Thurston Hole with a known history of hypertension,  gastroesophageal reflux, hyperlipidemia, osteoarthritis, and chronic  rhinitis who presents today for a routine followup.  The patient  complains that over the last 2 weeks she has had nasal congestion,  postnasal drip symptoms, productive cough with thick yellow-green  sputum.  The patient denies any hemoptysis, fever, chest pain,  orthopnea, PND, or leg swelling.   PAST MEDICAL HISTORY:  Reviewed.   CURRENT MEDICATIONS:  Reviewed.   PHYSICAL EXAMINATION:  GENERAL:  The patient is a pleasant female in no  acute distress.  VITAL SIGNS:  She is afebrile with stable vital signs.  O2 saturation is  96% on room air.  HEENT:  Nasal mucosa with some mild erythema.  Nontender sinuses.  Conjunctivae noninjected.  Tympanic membranes normal.  Posterior pharynx  is clear.  NECK:  Supple, without cervical adenopathy.  No JVD.  LUNGS:  Clear.  CARDIAC:  Regular rate and rhythm.  ABDOMEN:  Soft and nontender.  EXTREMITIES:  Warm, without any edema.  NEUROLOGIC:  Intact.   ASSESSMENT AND PLAN:  1. Acute upper respiratory infection.  The patient was given Omnicef      x5 days.  Mucinex DM twice daily.  Nasal hygiene regimen with Afrin      and saline nasal spray x5 days.  Instruction sheet given.  The      patient may also use Drixoral Cold and Allergy as needed, and she      will continue on her Nasonex nasal spray 1 puff twice daily.  The      patient will return back with Dr. Sherene Sires in 6 weeks or sooner if      needed for a complete physical exam.  2. Hypertension.  Currently well-controlled.  The patient will      continue  on her present regimen and follow back up as scheduled or      p.r.n.  Lab work, including CBC, TSH, and BMET, are pending.  3. Gastroesophageal reflux.  Currently well-controlled.  The patient      is to continue on Protonix 40 mg twice daily along with reflux      preventative measures.  She was advised on weight loss.  4. History of chronic headaches.  The patient reports that she needs a      refill of her Migrazone      headache pills.  The patient reports the headaches have been      infrequent and has just recently run out of refills.  The patient      will follow  up as scheduled and p.r.n.      Rubye Oaks, NP  Electronically Signed      Charlaine Dalton. Sherene Sires, MD, [facility]  Electronically  Signed   TP/MedQ  DD: 06/23/2007  DT: 06/24/2007  Job #: [id number]

## 2010-11-28 NOTE — Op Note (Signed)
NAME:  [name], [name] NO.:  [id number]   MEDICAL RECORD NO.:  [id number]          PATIENT TYPE:  AMB   LOCATION:  [facility]                         FACILITY:  [facility]   PHYSICIAN:  Maretta Bees. Vonita Moss, M.D.DATE OF BIRTH:  10-19-1930   DATE OF PROCEDURE:  05/06/2007  DATE OF DISCHARGE:                               OPERATIVE REPORT   PREOPERATIVE DIAGNOSIS:  Right hydronephrosis and right flank pain due  to a distal right ureteral calculus measuring 6 to 8 mm.   POSTOPERATIVE DIAGNOSIS:  Right hydronephrosis and right flank pain due  to a distal right ureteral calculus measuring 6 to 8 mm.   PROCEDURE:  Cystoscopy, right ureteroscopy, right retrograde pyelogram  with interpretation and laser fragmentation of stone and stone basketing  and insertion of right double-J catheter.   SURGEON:  Dr. Larey Dresser.   ANESTHESIA:  General.   INDICATIONS:  This lady has developed right flank pain and  hydronephrosis due to a distal right ureteral stone.  She is brought to  the OR today for therapy of this problem.   PROCEDURE:  The patient is brought to the operating room, placed in  lithotomy position.  External genitalia were prepped and draped in usual  fashion.  She was cystoscoped.  She does have a cystocele and the  bladder was otherwise unremarkable.  A guidewire was placed up the right  ureter and visually on fluoroscopy saw to bypass the stone.  The  ureteral orifice was fairly sizable so I was able to insert a 6-French  rigid ureteroscope up to this yellow impacted stone.  It was too big to  basket so I fragmented it into multiple pieces with the holmium laser.  I was able to basket out some gravel but there was some bleeding from  the stone fragments and is felt that further instrumentation would not  be advisable.   Using the ureteroscope I performed a right retrograde pyelogram that  showed right hydroureter and right pyelocaliectasis which was noted  preop.  I  injected contrast as I removed the scope and the ureter was  noted to be intact with no extravasation.   I felt at this time the best thing to do was not manipulate the ureter  further since no harm had been done and put up double-J catheter which  would allow the stone fragments to pass or at least facilitate a second  look at another time.   A 6-French 26 cm double-J catheter was placed with a full coil in the  upper calix and a full coil in the bladder.  No string was on the double-  J.  At this point the bladder was emptied, scope removed.  The patient  sent to recovery room in good condition having tolerated the procedure  well.      Maretta Bees. Vonita Moss, M.D.  Electronically Signed     LJP/MEDQ  D:  05/06/2007  T:  05/07/2007  Job:  [id number]

## 2010-11-28 NOTE — Assessment & Plan Note (Signed)
[facility]                             PULMONARY OFFICE NOTE   NAME:[name], [name]                      MRN:          [id number]  DATE:03/12/2007                            DOB:          [birth year]    HISTORY OF PRESENT ILLNESS:  The patient is a 77 year old female patient  of Dr. Thurston Hole who has a known history of asthmatic bronchitis,  hypertension, osteoarthritis and mild dementia, presents today for a  routine followup.  Patient does complain over the last two days she has  had some dry cough and nasal congestion.  The patient denies any fever,  purulent sputum, chest pain, orthopnea, PND or leg swelling.  Patient  has not been seen in the office in greater than 6 months, reporting that  she has been doing well without any significant problems.   PAST MEDICAL HISTORY:  Reviewed.   CURRENT MEDICATIONS:  Reviewed.   PHYSICAL EXAM:  Patient is a pleasant, obese, female in no acute  distress.  Temperature is 99.2, blood pressure 128/76, O2 saturations  97% on room air.  Weight is up 14 pounds.  HEENT:  Unremarkable.  NECK:  Supple without cervical adenopathy.  No JVD.  LUNG SOUNDS:  Clear.  CARDIAC:  A regular rate.  ABDOMEN:  Soft and nontender.  EXTREMITIES:  Warm without any edema.   IMPRESSION AND PLAN:  1. Mild rhinitis flare.  Patient is recommended to use Mucinex DM      twice daily as needed for cough and Drixoral Cold & Allergy as      needed for nasal drip and drainage.  Patient is to return back here      in 1 month, for a complete physical exam by Dr. Sherene Sires, or sooner if      needed.  2. Hypertension, currently well controlled on her present regimen.  3. Mild dementia, currently maintained on Namenda and Aricept.      Patient will continue on present regimen.  Refills of medications      were all given today.  Patient will follow up in 1 month as      scheduled, for a complete physical exam, or sooner if needed.     Rubye Oaks, NP  Electronically Signed      Charlaine Dalton. Sherene Sires, MD, [facility]  Electronically Signed   TP/MedQ  DD: 03/13/2007  DT: 03/14/2007  Job #: [id number]

## 2010-12-01 NOTE — H&P (Signed)
NAME:  [name], [name]                        ACCOUNT NO.:  [id number]   MEDICAL RECORD NO.:  [id number]                   PATIENT TYPE:  INP   LOCATION:  0469                                 FACILITY:  [facility]   PHYSICIAN:  Charlaine Dalton. Sherene Sires, M.D. [facility]           DATE OF BIRTH:  1931-04-11   DATE OF ADMISSION:  12/14/2003  DATE OF DISCHARGE:                                HISTORY & PHYSICAL   CHIEF COMPLAINT:  Abdominal pain x4 days.   HISTORY OF PRESENT ILLNESS:  Patient is a very complicated 76 year old white  female patient of Dr. Sherene Sires who has a known history of hypertension who  presents today for an acute office visit.  Patient complains that over the  last four days, she has had a vague discomfort over the right lower abdomen  with associated increased gas, nausea, decreased appetite, fever, chills,  sweats, and weakness.  Patient has no documented fever.  Patient had taken  some Phenergan; however, without any relief.  The symptoms had been  progressively worsening, and the patient states that she is too weak to go  back home.  The patient has a known history of small renal calculi and is  followed by Dr. Vonita Moss.  Patient states that she was seen by him last week  and told that these were very small and she should be able to pass these  without any difficulty.  Patient states that the pain she is feeling now is  different from the pain she has felt in the past associated with kidney  stones.  The patient denies any chest pain, shortness of breath, vomiting,  diarrhea, bloody stools, hematochezia.  Patient has chronic urinary  difficulties, including incontinence; however, she denies any new urinary  symptoms.  Due to the patient's complex history and inability to care for  herself, she will require hospitalization to rule out possible underlying  acute abdomen.   PAST MEDICAL HISTORY:  1. Hypertension.  2. Gastroesophageal reflux and Barrett's esophagus.  Last endoscopy in   April, 2002.  She is followed by Dr. Victorino Dike.  3. Chronic rhinitis.  Last sinus CT was in February, 2004 and was     unremarkable.  4. Diverticulosis and colon polyps with a family history of colon cancer.  5. Hyperlipidemia with a target LDL goal of less than 130.  6. Osteoarthritis, status post bilateral total hip replacement in 1992.     Followed by Dr. Eulah Pont.  7. Dementia.  Followed by Dr. Vickey Huger.  8. Known small meningioma on MRI.  Currently being followed by neurology.  9. Urge incontinence and chronic urinary tract infections, per Dr. Vonita Moss.  10.      Immune globulin A polyclonal gammopathy.  11.      Chronic headaches and left facial neuropathy.  Followed by     neurology.   PAST SURGICAL HISTORY:  1. Status post cholecystectomy.  2. Status post hysterectomy.  CURRENT MEDICATIONS:  1. Lasix 20 mg daily.  2. Ogen 1.25 mg daily.  3. Citrucel 1 teaspoon q.d.  4. Ascriptin 325 mg daily.  5. Nasonex 2 puffs in the morning and 1 puff in the evening.  6. Norvasc 5 mg daily.  7. Prozac 40 mg daily.  8. Multivitamin daily.  9. Neurontin 800 mg b.i.d.  10.      Macrobid 100 mg daily.  11.      Nexium 40 mg b.i.d.  12.      Aricept 10 mg daily.  13.      K-Dur 20 mEq daily.  14.      Phenergan 25 mg p.r.n.  15.      Levsin 0.125 mg  p.r.n.  16.      Xanax 0.25 mg p.r.n.  17.      Maxzide p.r.n.  18.      Afrin p.r.n.  19.      Midrin p.r.n.  20.      Magic mouthwash p.r.n.  21.      Allegra-D p.r.n.  22.      Tylenol p.r.n.   DRUG ALLERGIES:  PENICILLIN.   SOCIAL HISTORY:  Patient is married and lives with her husband.  The couple  has six adult children.   FAMILY HISTORY:  Positive for colon cancer, diabetes, heart disease, and  hypertension.   REVIEW OF SYSTEMS:  Essentially negative, as noted above.   PHYSICAL EXAMINATION:  VITAL SIGNS:  She is afebrile.  Blood pressure is  138/82, O2 saturation is 98%.  Pulse is 104.  Respiratory rate 20.  GENERAL:   Patient is a frail, elderly white female in no acute distress.  HEENT:  Atraumatic and normocephalic.  PERRL.  Sclerae are anicteric.  Posterior pharynx is clear.  TM's are normal.  NECK:  Supple without cervical adenopathy.  No JVD.  Carotids are equal with  positive upstrokes bilaterally.  LUNGS:  Lung sounds are clear to auscultation bilaterally.  HEART:  S1 and S2 without murmur, rub or gallop.  ABDOMEN:  Soft with positive bowel sounds throughout all four quadrants.  Patient does have tenderness along the right lower quadrant.  No guarding or  rebound.  Negative CVA tenderness.  EXTREMITIES:  Warm without any clubbing, cyanosis or edema.   IMPRESSION/PLAN:  Right lower quadrant abdominal pain:  Patient will need to  be ruled out for possible appendicitis versus obstructive renal calculi.  Patient will be started on IV fluids, p.r.n. Phenergan, and pain control,  including Demerol.  At this time, we will hold her Lasix and potassium.  Labs are currently pending.  Patient has been set up for a CT of the abdomen  and pelvis as well as abdominal film.  We will follow up accordingly.     Tammy Parrett, P.A. [facility]                   Michael B. Sherene Sires, M.D. [facility]    TP/MEDQ  D:  12/14/2003  T:  12/14/2003  Job:  [id number]

## 2010-12-01 NOTE — Consult Note (Signed)
[place]  Patient:    [name], [name]                      MRN: [id number] Proc. Date: 05/23/00 Adm. Date:  [id number] Attending:  Herold Harms CC:         Loreta Ave, M.D.   Consultation Report  REASON FOR CONSULTATION:  Hypotension.  HISTORY:  The patient is an exceptionally complicated but very nice 77 year old white female who I have followed as primary physician chronically with morbid obesity complicated by hypertension, GERD and hyperlipidemia.  She is now three days status post right hip revision surgery by Dr. Eulah Pont with postoperative course complicated symptomatically by weakness, general failure to thrive and difficulty swallowing with sore throat.  However, she denies any chest pain and has had no fever, nausea, vomiting, diaphoresis, chills, sweats or leg swelling.  PAST MEDICAL HISTORY: Chronic fatigue improved in the past by Prozac.  Chronic rhinitis with a history of chronic immunotherapy and negative allergy testing several years ago.  Repeat allergy test is negative several years and so she has not been subjected to immunotherapy again.  Hypertension uncomplicated to date with negative stress Cardiolite in February 1998.  Status post bilateral hip replacement for DJD.  Status post cholecystectomy and vaginal hysterectomy.  Diverticulitis by previous flexible sigmoidoscopy in 1994.  ALLERGIES:  Penicillin causes a rash.  Codeine makes her nauseated.  MEDICATIONS:  She has been chronically on Cozaar and furosemide for hypertension as well as Norvasc 5 mg per day.  These have been held due to hypotension postoperatively.  SOCIAL HISTORY:  She smoked only as a teenager.  She is married to a former smoker; however.  She is extremely sedentary.  FAMILY HISTORY:  Positive for colon cancer in her mother.  Positive for kidney failure in her father.  Hypertension and heart disease present in several brothers.  REVIEW  OF SYSTEMS:  Taken in detail from the patient and is essentially negative except as noted in the present illness.  PHYSICAL EXAMINATION:  This is a morbidly obese, white female sitting in a chair, difficulty sitting up without assistance.  She has a somewhat depressed affect with no increased work of breathing at rest.  The blood pressure recorded as 90 this morning, pulse rate around 100 and regular.  HEENT:  Significant for moderate thrush of the oral pharynx.  No evidence of post nasal injury or cobblestoning.  Neck was supple without cervical adenopathy or tenderness.  Trachea is midline.  There is no thyromegaly.  Lung fields were perfectly clear bilaterally except for minimal pseudowheezing. There is a regular rate and rhythm with no murmur, gallop or rub present. Abdomen is obese but otherwise benign.  Extremities warm without calf tenderness, cyanosis, clubbing or edema.  Neurologic: No focal deficits or pathologic reflexes.  LABORATORY DATA:  Saturation is adequate on room air.  Hematocrit was 21% yesterday and is 28% today after two units of transfusion yesterday.  INR is 3.5.  IMPRESSION: 1. Hypotension most likely is due to postoperative anemia plus the residual    effects of Norvasc and Cozaar (Norvasc does not immediately wash out of    the system after the dose is stopped the first time).  I recommended that    her Coumadin be held and that she be given FFB if there is any gross GI    bleeding and further transfusions may be necessary pending repeat CBCs.  She seems to be tolerating this quite well; however, no evidence of    pulmonary embolism or angina or sepsis at this point, so I simply    treated her conservatively with fluids and additional transfusions if    necessary. 2. The complaints of sore throat and mild oral Candidiasis noted.  I    recommended clotrimazole lozenges q.i.d. plus p.r.n. Magic mouth wash. 3. Morbid obesity continues to be this patients  primary general medical    problem. It is going to be very mobilize her and rehab her affectively    without better motivation on her part.  We will continue to push her    though to avoid complications of immobility such as DVT, pulmonary    embolism, decubitus ulcer, atelectasis, etc.  I explained this all to    the patient in detail. 4. Postoperative anemia.  I note that she has had trace rectal bleeding.    I am going to increase her empiric Protonix up to 40 mg b.i.d. and    I would have a low threshold to continue to transfuse her and also    consider a repeat GI workup (her last sigmoidoscopy was done by Dr.    Terrial Rhodes in 1994) and the GI service of [facility]    needs to be consulted if she has any obvious rectal bleeding that is    not directly related to straining with bowel movements.  I would be happy to see this patient back during this hospitalization if you think direct medical input is necessary.DD:  05/23/00 TD:  05/24/00 Job: [id number]

## 2010-12-01 NOTE — Assessment & Plan Note (Signed)
[facility]                               PULMONARY OFFICE NOTE   NAME:O'CONNELLMialynn, [name]                     MRN:          [id number]  DATE:04/11/2006                            DOB:          06-17-1931    PRIMARY SERVICE/COMPREHENSIVE CARE EVALUATION   HISTORY:  A 77 year old white female with morbid obesity complicated by  hypertension, GERD with Barrett's esophagitis, chronic rhinitis with  persistent post-nasal drip syndrome refractory to conventional therapy,  hyperlipidemia, and severe DJD.  She has been evaluated by Dr. Vickey Huger  before with the diagnosis of dementia and has been maintained on Aricept  chronically at 10 mg daily, but comes in now with the family concerned that  she is losing it in terms of short-term memory.  The patient, herself,  does not appear to be concerned about this and denies any new complaints.  She does come in her complaining of dyspnea on exertion and also drainage  down the back of her throat but states there has been no change over the  last year.  She does complain of very dry mouth but no hypersomnolence from  clonidine.   PAST MEDICAL HISTORY:  1. Significant for the problems as outlined above plus diverticulosis and      colon polyps with most recent colonoscopy March 21, 2004.  2. GERD with Barrett's.  Most recent upper endoscopy March 21, 2004.  3. Chronic anxiety and depression.  4. Chronic recurrent urinary tract infections followed by Dr. Vonita Moss.  5. Status post cholecystectomy.  6. Status post hysterectomy.  7. Status post bilateral total hip replacements.   ALLERGIES:  PENICILLIN.   MEDICATIONS:  Taken in detail on the worksheet column dated April 11, 2006.  Correct as listed, although note that she and her husband are  struggling a bit with the concept of medication counter despite our best  efforts to the contrary.   SOCIAL HISTORY:  She has never smoked cigarettes.  Denies any  alcohol use.   FAMILY HISTORY:  Positive for colon cancer in her mother age 2.  Father  died of hypertension complications.  No premature.  There is also history of  heart disease and alcoholism in her brother.   REVIEW OF SYMPTOMS:  Taken in detail on the worksheet.  Negative as outlined  above.  Negative for any unusual ataxia or urinary incontinence, morning  headache, or focal neurologic complaints.   PHYSICAL EXAMINATION:  GENERAL:  This is a depressed-appearing, ambulatory,  obese, white female in acute distress.  VITAL SIGNS:  She is afebrile.  Blood pressure 124/86, weight is 179 pounds  which is no change in baseline.  HEENT,NT:  Limited funduscopy revealed no obvious arterial change.  Oropharynx was clear.  Dentition was intact.  NECK:  Was supple without cervical adenopathy or tenderness.  Trachea is  midline.  No thyromegaly.  LUNG FIELDS:  Revealed minimal rhonchi bilaterally.  Overall air movement  was adequate.  HEART:  Regular rate and rhythm without murmur, gallop, or rub present.  ABDOMEN:  Soft, benign, with no palpable organomegaly, masses, or  tenderness.  EXTREMITIES:  Were warm without calf tenderness, cyanosis, clubbing, or  edema.  NEUROLOGIC:  No focal deficits, pathologic.  SKIN EXAM:  Was warm and dry.   LABORATORY DATA:  CBC revealed a hematocrit of 35.7, MCV of 93.  Fasting  glucose of 118.  Total cholesterol 204, LDL cholesterol of 136, HDL of 55,  CRP of 16.  Urinalysis revealed just a few WBC  TSH was normal.   IMPRESSION:  1. Obesity with geriatric failure to thrive and worsening short term      memory loss consistent with dementia.  She already has an appointment      to see Dr. Thad Ranger within the next week.  I have asked her to take her      medication sheet with her when she goes to see Dr. Thad Ranger.  2. Hypertension.  Some of her mental status changes and dry mouth may be      related to clonidine.  I recommend reducing the dose down to  one-half      tablet b.i.d. empirically to see if there is any benefit in terms of      her symptoms.  3. Morbid obesity continues to be her primary medical problem, but at this      point in her life it is going to be very difficult for her to address      this issue in a meaningful way, despite our best efforts to help her to      the contrary.  I did point this out to her and her daughter, and also      the limit to what medical science can do at this point.  4. Chronic rhinitis with post-nasal drip syndrome.  Looking at her laundry      list of medicines, if it is not helping, I would like to stop it.  Use      the philosophy of stopping it.  That is, if she has reached her      plateau in terms of symptoms response from her maintenance drugs, I      would begin to eliminate them 1 after the other, starting with      Singulair.  If we find she has more symptoms, or more need for p.r.n.      then we could change her maintenance regimen.  This Barrett's      esophagitis needs to continue on high dose PPI therapy as outlined      above and recommended by Dr. Victorino Dike, GI physician of record.  5. Health management.  She was updated on tetanus in 2004 and is no longer      needs regular GYN care at this point, status post remote vaginal      hysterectomy.            ______________________________  [name] Cristina Sires, MD, [facility]    MBW/MedQ  DD:  04/12/2006 DT:  04/14/2006 Job #:  [id number]

## 2010-12-01 NOTE — Procedures (Signed)
[facility]  Patient:    [name], [name]                      MRN: [id number] Proc. Date: 10/04/00 Adm. Date:  [id number] Attending:  Thyra Breed CC:         Mirna Mires, M.D.   Procedure Report  PROCEDURE:  Occipital nerve block on the left side.  INTERVAL HISTORY:  The patient has noted that her lower back discomfort is markedly improved and she is able to stand in her shower without a great deal of lower back discomfort.  She continues to have some alterations in her gait due to her previous surgeries on her hips.  Her biggest complaint today is with regard to her neck.  She has a history of a mannequin falling on her head in a department store and since then has had left-sided neck discomfort radiating up into the left occipital region.  At times it is associated with burning dysesthesias.  She has seen Dr. Loleta Chance who has performed trigger point injections which have not resulted in long-term benefits.  She has had plain films which show some degenerative changes at C3-4, and 4-5 in the disc region.  PHYSICAL EXAMINATION:  Blood pressure 116/59, heart rate 84, respiratory rate 16, O2 saturations 96%.  Pain level was 4/10.  The patient demonstrates good range of motion of her neck with pain on extremes of rotation, but negative Spurlings sign.  She exhibited tenderness over the greater and lesser occipital grooves on the left side which were marked.  Deep tendon reflexes were symmetric in the upper extremity, motor was symmetric and grossly intact.  DESCRIPTION OF PROCEDURE:  After informed consent was obtained, the patient was placed in the sitting position and monitored.  I prepped out the greater and lesser occipital grooves on the left side with Betadine.  Using a 25 gauge needle, I injected 8 cc of local anesthetic consisting of 1% lidocaine mixed with 0.5% levobupivacaine in a 1:1 ratio.  The patient tolerated this well.  POST-PROCEDURE  CONDITION:  Stable.  DISCHARGE INSTRUCTIONS:  1. Resume previous diet.  2. Limitation of activities per instruction sheet as outlined by my assistant today.  3. Continue on current medications.  4. Follow up with me in approximately 10 days for reevaluation and consideration for repeat occipital nerve blocks.  At this point, I do not see the need for epidural steroid injections since she has had a very good response to the SI joint injection. DD:  10/04/00 TD:  10/05/00 Job: [id number]

## 2010-12-01 NOTE — Discharge Summary (Signed)
[place]  Patient:    SHAVELL, NORED Visit Number: [id number] MRN: [id number]          Service Type: SUR Location: [id number] Attending Physician:  Colbert Ewing Dictated by:   Oris Drone Petrarca, P.A.-C. Admit Date:  05/22/2001 Discharge Date: 05/26/2001                             Discharge Summary  ADMISSION DIAGNOSIS:  Failed left total hip replacement.  DISCHARGE DIAGNOSES: 1. Failed left total hip replacement. 2. Morbid obesity. 3. Esophageal reflux. 4. Depressive disorder. 5. Hypertension.  PROCEDURE:  Revision of left total hip replacement.  HISTORY OF PRESENT ILLNESS:  This patient is a 77 year old white female now 10 years status post left total hip replacement.  She is having persistent pain and radiation into her left knee.  She has had similar symptoms which were referable to a failed right total hip replacement.  Since her symptoms were worsening, she is now indicated for revision of the left total hip.  This is mainly of the acetabular component.  HOSPITAL COURSE:  A 77 year old white female admitted May 22, 2001. After appropriate laboratory studies were obtained as well as 1 g of vancomycin IV on call to the operating room, was taken to the operating room where she underwent a revision of the right total hip replacement.  She tolerated the procedure well.  She was placed on heparin 5000 units subcu q.12h. until her Coumadin became therapeutic.  Vancomycin 1 g IV q.12h. for three doses postop was also given.  Consultations with PT and OT were obtained.  She was allowed to ambulate weightbearing as tolerated on the left.  Dilaudid PCA pump was used.  She has had difficulty with some dryness of the throat and soreness and Chloraseptic spray was used.  Her dressing was changed on May 24, 2001, and her wound was benign.  The remainder of her hospital course was uneventful and she was discharged on  May 26, 2001, to return back to the office in 10 days for staple removal.  RADIOGRAPHIC STUDIES:  May 15, 2001, chest x-ray shows low lung volumes but no acute pulmonary disease.  LABORATORY STUDIES:  May 20, 2001, hemoglobin 13.6, hematocrit 40%, white count 13,200, platelets 269,000.  Hemoglobin of May 25, 2001, was 10.4, hematocrit 30.7%.  Chemistries May 20, 2001, sodium 138, potassium 3.7, chloride 100, CO2 30, glucose 94, BUN 10, creatinine 0.7, calcium 9.2, total protein 7.9, albumin 3.1, AST 17, ALT 12, ALP 119, total bilirubin 0.9. May 25, 2001, sodium 141, potassium 3.7, chloride 101 , CO2 35, glucose 121, BUN 11, creatinine 0.6, calcium 8.6.  Urinalysis was benign for voided urine.  She was blood type O positive, antibody screen negative.  Cultures of the left hip revealed no organisms x 2.  DISCHARGE INSTRUCTIONS:  She was given a prescription for Percocet 5/325 one to two tablets every four hours as needed for pain.  Coumadin 5 mg daily as per [place].  She will follow total hip protocol instruction sheet.  House diet.  Keep the wound clean and dry. Change dressing daily.  She will call if she has any concerns.  She is to follow up in our office in 10 days for staple removal. Dictated by:   Oris Drone. Petrarca, P.A.-C. Attending Physician:  Colbert Ewing DD:  06/19/01 TD:  06/19/01 Job: [id number]

## 2010-12-01 NOTE — H&P (Signed)
[facility]  Patient:    [name], [name]                      MRN: [id number] Adm. Date:  [id number] Attending:  Thyra Breed CC:         Reinaldo Meeker, M.D.             Loreta Ave, M.D.             Charlaine Dalton. Sherene Sires, M.D. [facility]                         History and Physical  CHIEF COMPLAINT:  [name] comes in for her possible third injection today.  HISTORY OF PRESENT ILLNESS:  Since her last evaluation, the patient has not noted a lasting improvement with her epidural steroid injections.  She feels like she is back to where she was before.  She complains predominantly of right SI joint discomfort and right knee pain today.  She stated when she was last seen by Dr. Prince Rome in Dr. Sandrea Hammond office, x-rays were done of the knee, and Dr. Eulah Pont did not want to inject her knee.  She has had a previous injection into her right SI joint, and states that this is where she is hurting significantly today.  I advised her that if I injected the joint, it would not significantly effect the pain into her knee, if it is coming from her herniated disk.  The herniation predominantly goes to the left side, and her symptoms are mostly in he right knee.  I do not have the results of her interpretation of her knee film. She takes the Percocet I prescribed for her, two tablets q.d. with Phenergan.  Other medications include Cozaar, furosemide, Ogen, Prozac, Citrucel, Centrum, Tylenol p.r.n., which is not to be taken with the Percocet, Rhinocort, Norvasc,  Prilosec, K-Dur, Vioxx, and Demerol, which she is not to take if she takes the Percocet.  PHYSICAL EXAMINATION:  VITAL SIGNS:  Blood pressure 152/77, heart rate 95, respirations 16, O2 saturation 94%.  Pain level is 7/10.  NEUROLOGIC:  She demonstrated symmetric deep tendon reflexes at the ankles, mildly depressed right versus left knee jerk.  Straight leg raising signs are negative. Motor is 5/5.   Range of motion of her knee is intact, but she does have a valgus deformity.  IMPRESSION: 1. Low back pain with underlying lumbar degenerative disk disease and    facet joint arthritis with underlying disk herniation to the left    at L3-4. 2. Right knee pain, which may be coming from her back, versus intrinsic    knee problems. 3. Other medical problems per Dr. Nyoka Cowden.  DISPOSITION: 1. The patient was advised that a third injection in the epidural space    would not be of much benefit, so I have recommended that we not go that    route. 2. She plans to see Dr. Rolanda Lundborg Kritzer back next week, and I encouraged her    to follow through, to find out if there were any further interventions he    had planned. 3. Consider right SI joint injection if Dr. Gerlene Fee and Dr. Eulah Pont have no    further options. 4. Consider placing the patient on long-acting opiates rather than short-acting    opiates, as it is generally safer for the patient, and is associated with    less side effects.  She is concerned about getting a GI ulceration from    Percocet, which I advised her would not cause this, unless it was merely    from the tablet. 5. I did offer to see her back in followup if Dr. Gerlene Fee and Dr. Eulah Pont are    in agreement, or if Dr. Sherene Sires is not interested in prescribing long-acting    opiates, which I would recommend she would probably do well on 10 mg of    OxyContin b.i.d. DD:  01/16/00 TD:  01/16/00 Job: [id number]

## 2010-12-01 NOTE — Assessment & Plan Note (Signed)
[facility]                             PULMONARY OFFICE NOTE   NAME:OCONNELLSuzy, Kugel                      MRN:          [id number]  DATE:09/05/2006                            DOB:          March 10, 1931    HISTORY OF PRESENT ILLNESS:  Patient is a 77 year old white female  patient of Dr. Thurston Hole with a known history of asthmatic bronchitis,  hypertension, osteoarthritis, and dementia, who presents for a one week  history of nasal congestion, sore throat, productive cough.  The patient  denies any chest pain, orthopnea, recent travel, or antibiotic use.   PAST MEDICAL HISTORY:  Reviewed.   CURRENT MEDICATIONS:  Reviewed.   PHYSICAL EXAMINATION:  GENERAL:  Patient is a pleasant female in no  acute distress.  VITAL SIGNS:  She is afebrile with stable vital signs.  HEENT:  Nasal mucosa with some mild erythema.  Nontender sinuses.  Posterior pharynx is clear.  NECK:  Supple without adenopathy.  LUNGS:  The lung sounds are clear.  CARDIAC:  Regular rate.  ABDOMEN:  Soft and nontender.  EXTREMITIES:  Warm without any edema.   IMPRESSION/PLAN:  Acute upper respiratory tract infection:  Patient was  placed on Z pack, Mucinex DM twice daily, Tussionex as needed for cough.  Patient is aware of sedating effect.  Patient is to return back with Dr.  Sherene Sires as scheduled or sooner if needed.      Rubye Oaks, NP  Electronically Signed      Charlaine Dalton. Sherene Sires, MD, [facility]  Electronically Signed   TP/MedQ  DD: 09/05/2006  DT: 09/05/2006  Job #: [id number]

## 2010-12-01 NOTE — Discharge Summary (Signed)
NAME:  [name], [name]                        ACCOUNT NO.:  [id number]   MEDICAL RECORD NO.:  [id number]                   PATIENT TYPE:  INP   LOCATION:  0469                                 FACILITY:  [facility]   PHYSICIAN:  Charlaine Dalton. Sherene Sires, M.D. [facility]           DATE OF BIRTH:  10-25-1930   DATE OF ADMISSION:  12/14/2003  DATE OF DISCHARGE:  12/17/2003                                 DISCHARGE SUMMARY   DISCHARGE DIAGNOSES:  1. Abdominal pain of questionable etiology, although was found to have     bilateral renal calculi.  2. Obesity.  3. Depression.  4. Herpes zoster.   HISTORY OF PRESENT ILLNESS:  Ms. Maestas is a complex 77 year old white  female, patient of Dr. Sandrea Hughs of pulmonary and Dr. Victorino Dike of  gastroenterology, who presented to the office on Dec 14, 2003 and was seen  by the nurse practitioner, Rubye Oaks, and was found to have abdominal  pain. She has known renal calculi; however, this is felt to be outside the  normal parameters of renal calculi. For that reason and her complex medical  history, she was admitted for further evaluation and treatment.   LABORATORY DATA:  Urine culture demonstrates insignificant growth. WBCs 9.1,  hemoglobin 14.5, hematocrit 42.8, platelets are 287. ESR is 47. Sodium 137,  potassium 3.1, chloride 97, CO2 30, glucose 123, BUN 7, creatinine 1.0,  calcium 9.0. Albumin 3.3, AST 22, ALT 13, ALP is 77, total bilirubin 0.5.   RADIOGRAPHIC DATA:  Acute abdomen shows bilateral renal calculi with benign  gas pattern. CT of the abdomen and pelvis shows bilateral renal calculi  without hydronephrosis or ureteral calcification. No acute inflammatory  process. The pelvis demonstrates hysterectomy and oophorectomy. No nodules  or visualization of the appendix and sigmoid diverticulosis. No acute  abnormalities.   HOSPITAL COURSE:  1. Acute abdominal pain. Was found to have bilateral renal calculi. She     actually passed one renal  calculi while in the hospital, and her     abdominal pain has resolved. After a thorough GI evaluation, it was felt     that this was renal in nature and therefore should be continued on her     current medications.  2. Obesity. Remains unchanged.  3. Depression. She will continue on her current medication.  4. Herpes zoster of the left hip. She is placed on acyclovir and topical     medication for this.   DISCHARGE MEDICATIONS:  1. Zovirax as instructed.  2. Dermatop as instructed.  3. Nexium 40 mg b.i.d.  4. Lasix 20 mg q.d.  5. Ogen 1.25 mg q.d.  6. Citrucel one teaspoon q.d.  7. Ascriptin 325 mg one a day.  8. Nasonex two puffs two times a day.  9. Norvasc 5 mg a day.  10.      Prozac 40 mg a day.  11.      Multivitamin  one a day.  12.      Neurontin 800 mg two three times a day.  13.      Aricept 10 mg once a day.  14.      K-Dur 20 mEq once a day.  15.      Phenergan 25 mg p.r.n.  16.      ______________ 0.125 mg p.r.n. as needed.  17.      Xanax 0.25 mg q.6h. p.r.n. as needed.  18.      Midrin as needed.  19.      Allegra D one as needed.  20.      New medication clonidine 0.1 mg two times a day.   DIET:  Soft, bland until abdominal discomfort improves.   SPECIAL INSTRUCTIONS:  Should call for any problems. She had a followup  appointment with the nurse practitioner on June 20 at 10 a.m. and with Dr.  Sherene Sires on June 27 at 1:45 p.m. and followup appointment with Dr. Victorino Dike  on July 6 at 3 p.m.   DISPOSITION/CONDITION ON DISCHARGE:  Improved.     Brett Canales Minor, A.C.N.P. [facility]                 Charlaine Dalton. Sherene Sires, M.D. [facility]    SM/MEDQ  D:  12/17/2003  T:  12/17/2003  Job:  [id number]   cc:   Jonelle Sidle, M.D. [facility]   [employer]. Vonita Moss, M.D.  [address]  [place]  [place] [address]  Fax: [phone]   Loreta Ave, M.D.  [address] [place]  [place] [address]  Fax: [phone]

## 2010-12-01 NOTE — Assessment & Plan Note (Signed)
[facility]                               PULMONARY OFFICE NOTE   NAME:[name]                     MRN:          [id number]  DATE:04/12/2006                            DOB:          02-06-1931    HISTORY OF PRESENT ILLNESS:  Patient is a very complicated 77 year old white  female patient of Dr. Thurston [name] with a known history of asthmatic bronchitis,  hypertension and osteoarthritis.  Patient returns today for a complete  review of her medications and review of lab work.  Patient was seen  yesterday by Dr. Sherene [name] for a complete physical examination.  Patient's lab  work showed a total cholesterol of 204, LDL of 136 and HDL of 55.  Patient's  thyroid panel was normal and hemoglobin was slightly decreased at 11.7.  Patient does have a history of chronic urinary tract infections and did have  positive nitrates on her urinalysis.  Unfortunately, a culture was not done.  Patient  had previously been on chronic Proloprim, however, this has been  recently discontinued.  Patient, last visit, was discontinued off of her  Neurontin due to possible sedating side effects.  Also, her clonidine was  decreased down to half a tablet twice daily.  Patient was unclear of these  directions.  These have been reemphasized at today's visit.  Patient also  has brought all her medications in today and we have reviewed each of her  medications and adjusted her medication calendar.  Her daughter is in  attendance and verbalizes understanding.   PAST MEDICAL HISTORY:  Reviewed.   CURRENT MEDICATIONS:  Reviewed.   PHYSICAL EXAMINATION:  GENERAL APPEARANCE:  Patient is a pleasant female in  no acute distress.  VITAL SIGNS:  She is afebrile with stable vital signs.  HEENT:  Unremarkable.  NECK:  Supple without adenopathy, no JVD.  LUNGS:  Lung sounds are clear.  CARDIOVASCULAR:  Regular rate.  ABDOMEN:  Soft and benign.  EXTREMITIES:  Warm without any calf tenderness,  clubbing, cyanosis, or  edema.   LABORATORY DATA:  Hemoglobin 11.7.  Cholesterol total 204, triglycerides 88,  HDL 55, LDL 136.  Blood sugar 118.  Albumin 3.2.  TSH 3.71.  CRP is 17.   IMPRESSION AND PLAN:  1. Complex medication regimen.  Patient's medications were reviewed in      detail.  Patient education is provided.  Her computerized medication      calendar was adjusted accordingly and reviewed in detail.  Patient is      aware to bring this back to each and every visit.  2. History of mild dementia.  Patient does have a follow-up with her      neurologist in the next two weeks and will follow up accordingly.      Patient has currently been discontinued off her Neurontin for suspected      intolerance symptoms of sedation and possible confusion.  Patient will      remain off of this until evaluated by her neurologist.  Also patient's      clonidine was decreased down to a half  tablet twice a day for these      same symptoms.  Patient will recheck back  here in 6-8 weeks or sooner      if needed.  3. History of chronic urinary tract infection.  Will repeat urinalysis      with a urine culture today and follow up accordingly.      ______________________________  Cristina Oaks, NP    ______________________________  [name]. Cristina Sires, MD, [name]     TP/MedQ  DD:  04/12/2006  DT:  04/15/2006  Job #:  [id number]

## 2010-12-01 NOTE — Procedures (Signed)
[facility]  Patient:    [name], [name]                      MRN: [id number] Proc. Date: 09/27/00 Adm. Date:  [id number] Attending:  Thyra Breed CC:         Lillia Carmel, M.D.   Procedure Report  PROCEDURE:  Right SI joint injection.  DIAGNOSIS:  Right hip and lower back discomfort with underlying lumbar degenerative disk disease and mild spinal stenosis.  HISTORY:  The patient is a very pleasant 77 year old, who we have seen previously for epidural steroid injections which were not very helpful at reducing her pain.  Since her last series of epidurals by [place] back in July 2001, the patient was found to have some loosening of her prosthesis screws and underwent revision of this.  Since then, her right lower extremity pain is markedly improved now.  She is left with right SI joint discomfort which is made worse whenever she gets up to walk.  She described it as a localized area of discomfort which is quickly relieved by sitting down.  There is no associated numbness, tingling, or bowel or bladder incontinence.  She does have some weakness into the left lower extremity, but most of her pain is localized at the right lower back region.  CURRENT MEDICATIONS:  Atarax, Phenergan, Levsin, Xanax, Maxzide, Humibid, Vioxx, ______ , Magic mouthwash, Allegra, furosemide, Ogen, Ascriptin, Norvasc, Prilosec, K-Dur, and Prozac.  PHYSICAL EXAMINATION:  Blood pressure 116/53, heart rate 95, respiratory rate 22, O2 saturations 96%, pain level 3/10, temperature 97.2.  The patient demonstrates mild scoliosis with kyphosis of the thoracolumbar spine with negative straight leg raise signs today.  Carotids are 2+ and symmetric without bruits.  Cranial nerves 2/12 are grossly intact.  She does have some exophthalmos.  Lungs are clear.  She exhibits tenderness over the right SI joint region.  Deep tendon reflexes in the lower extremities are 2+ at the right knee, 0 at  the left knee, 2+ at the ankles with downgoing toes.  Motor is grossly intact to resistance with symmetric bulk and tone.  Sensory exam reveals attenuated vibratory sense at the knees.  DISPOSITION:  I discussed with the patient the possibility of performing a lumbar epidural steroid injection, but she has not had very successful outcomes to these in the past.  They have never lasted more than 6-8 weeks which I advised her is really such a short-lived benefit that I would recommended trying a right SI joint injection which was what we discussed back in July.  She is willing to proceed with this.  She is aware that it will not be of any benefit to her left lower extremity physical signs of what looks like an L4 nerve impingement.  DESCRIPTION OF PROCEDURE:  After informed consent was obtained, the patient was taken to the Fluoroscopy Suite where she was placed in a prone position with a pillow under her abdomen.  Monitors were placed.  Using fluoroscopic guidance, I optimized visualization of the right SI joint.  The area was marked.  The skin was prepped with Betadine x 3 and draped.  I anesthetized the skin with 1% lidocaine, using a 25 gauge needle.  A 25 gauge spinal needle was introduced down into the right SI joint.  Aspiration was negative.  I injected 1/2 cc of 1% lidocaine.  The patient tolerated this well.  Medrol 40 mg in 1 cc of 1% lidocaine was  injected.  The needle was flushed with 1% lidocaine and removed intact.  POSTPROCEDURE CONDITION:  The patient was allowed to ambulate.  She does have a tendency to be shaky on her feet and lift the left leg less well than the right.  She had minimal right SI joint pain after the injection today.  DISPOSITION: 1. Resume previous diet. 2. Limitations on activities per instruction sheet, as outlined by my    assistant today. 3. The patient was advised that I would like to bring her back in 7-10 days to    try one epidural to see  whether this will help with the potential lumbar    radiculopathy to the left lower extremity.  Her right side seems to be    improved by the SI joint injection. DD:  09/27/00 TD:  09/27/00 Job: [id number]

## 2010-12-01 NOTE — Assessment & Plan Note (Signed)
[facility]                               PULMONARY OFFICE NOTE   NAME:O'CONNELLCodi, [name]                     MRN:          [id number]  DATE:05/31/2006                            DOB:          [birth year]    HISTORY OF PRESENT ILLNESS:  Patient is a 77 year old white female patient  of Dr. Thurston Hole with a known history of asthmatic bronchitis, hypertension,  and osteoarthritis, and mild dementia.  Presents for a two month routine  office visit.  Since the last visit, patient reports that she has been doing  fairly well without any significant complaints today.  She does continue to  follow up with the urologist for chronic urinary tract infections.  On last  visit, patient had been noted to have a urinary tract infection with a  positive culture for E. coli with multiple antibiotic resistances.  She was  started on Ceftin 250 mg b.i.d. x10 days.  She reports that she did see her  urologist, Dr. Vonita [name], yesterday.  Patient also had been having some  increased sedation, felt secondary to adverse effects of clonidine;  therefore, was decreased down to a half tablet twice daily.  Patient reports  this is improved significantly since the last visit on the lower dose of  clonidine.   Patient denies any chest pain, shortness of breath, or increased leg  swelling.   PAST MEDICAL HISTORY:  Reviewed.   CURRENT MEDICATIONS:  Reviewed.   PHYSICAL EXAMINATION:  GENERAL:  Patient is a pleasant female in no acute  distress.  VITAL SIGNS:  She is afebrile with stable vital signs.  Blood pressure is  114/80.  O2 saturation is 97% on room air.  HEENT:  Unremarkable.  NECK:  Supple without adenopathy.  LUNGS:  The lung sounds are clear.  CARDIAC:  Regular rate and rhythm.  ABDOMEN:  Soft and benign.  EXTREMITIES:  Warm without any calf cyanosis, clubbing, or edema.   DATA:  Lab work on April 11, 2006 revealed a hemoglobin of 11.7, total  cholesterol 204,  HDL 55, LDL 136.  BUN and creatinine 12 and 1.1,  respectively.  TSH 3.71.  CRP was 17.   IMPRESSION/PLAN:  1. Hypertension:  Currently well controlled.  Patient will continue on her      current regimen.  Follow back up with Dr. Sherene [name] in 2-3 months or sooner      if needed.  2. Gastroesophageal reflux:  Currently well controlled on Protonix and      anti-reflux preventative measures.  3. History of mild dementia:  Patient is to continue on Aricept and follow      back up with neurology as scheduled.  4. Chronic urinary tract infection:  Patient is to continue to follow up      with urology as scheduled.  5. Complex medication regimen.  Patient's medications were reviewed with      the patient.      Patient was given a copy of her computerized medication calendar.  6. Increased sedation and fatigue:  Now improved with decreased dose of  clonidine.      Cristina Oaks, NP  Electronically Signed      Cristina [name]. Cristina Sires, MD, [facility]  Electronically Signed   TP/MedQ  DD: 05/31/2006  DT: 05/31/2006  Job #: [id number]

## 2010-12-01 NOTE — Op Note (Signed)
[facility]  Patient:    [name], [name]                      MRN: [id number] Proc. Date: 12/25/99 Adm. Date:  [id number] Attending:  Thyra Breed CC:         Reinaldo Meeker, M.D.                           Operative Report  PROCEDURE:  Lumbar epidural steroid injection.  DIAGNOSIS:  Lumbar degenerative disk disease and facet joint arthritis with underlying herniated disk and mild lumbar spinal stenosis.  INDICATIONS:  The patient has noted a mild to modest improvement.  She did get  little bit hyper from the injection.  She continues to complain over her left thigh. Her back is much improved.  PHYSICAL EXAMINATION:  VITAL SIGNS: Blood pressure 159/78, heart rate 82, respiratory rate 16, O2 sat 95%.  Pain level is 6 out of 10.  Temperature 97.0.  BACK: Shows good healing from her previous injection site.  NEUROLOGICAL: Unchanged.  DESCRIPTION OF PROCEDURE:  After informed consent was obtained, the patient was  placed in the sitting position and monitored.  Her back was prepped with Betadine x 3.  A skin wheal was raised at the L4-5 interspace with 1% lidocaine.  A 20 gauge Tuohy needle was introduced in the lumbar epidural space with loss of resistance to preservative-free normal saline.  There was no CSF nor blood.  80 mg of Medrol nd 7 ml of preservative-free normal saline was gently injected.  The needle was flushed with preservative-free normal saline and removed intact.  POSTPROCEDURE CONDITION:  Stable.  DISCHARGE INSTRUCTIONS:  1) Resume previous diet.  2) Limitation of activities er instruction sheet.  3) Continue on current medications.  4) Follow up with Reinaldo Meeker, M.D. as previously arranged. DD:  12/25/99 TD:  12/27/99 Job: [id number]

## 2010-12-01 NOTE — Discharge Summary (Signed)
[place]. [facility]  Patient:    [name], [name]                      MRN: [id number] Adm. Date:  [id number] Disc. Date: [id number] Attending:  Herold Harms Dictator:   Oris Drone. Petrarca, P.A.-C.                           Discharge Summary  ADMISSION DIAGNOSIS:  Failure of the right acetabular component, status post total hip replacement.  DISCHARGE DIAGNOSIS: 1. Failure of the right acetabular component, status post total hip    replacement. 2. Exogenous obesity. 3. Postop anemia.  PROCEDURE:  Revision right acetabular component of total hip replacement.  HISTORY OF PRESENT ILLNESS:  A 77 year old white married female status post right total hip replacement in 1992.  She had developed significant pain in the posterior buttocks into the knee and was noted to have a marked change in the acetabular component with fracture of a screw.  She is now in a malaligned position.  Now indicated for revision total hip replacement.  HOSPITAL COURSE:  A 77 year old white female admitted May 20, 2000, after appropriate laboratory studies were obtained as well as 1 g of vancomycin IV on call to the operating room.  Was taken to the operating room where she underwent a revision of the right total hip replacement.  Tolerated the procedure well.  Placed on a PCA pump postoperatively with morphine. Consultation with PT, OT, rehab, and social services was ordered.  Ambulation: Weightbearing as tolerated with a walker on the right.  She was placed on heparin and Coumadin prophylaxis.  She did have some problems with hypokalemia which was treated with oral potassium.  She did have postoperative hemorrhagic anemia and because of hypotension and difficulty with her physical therapy, she was given 2 units of packed cells.  Her antihypertensives were held.  Once this had improved, it was felt that she was a candidate for transfer to rehab, and that was accomplished on  November 8.  LABORATORY AND X-RAY DATA:  EKG was noted to be normal sinus rhythm with left axis deviation and left ventricular hypertrophy.  Radiographic studies:  Right hip reveals distal prosthesis within normal limits without obvious femur fracture.  Pelvic reveals right hip revision, no obvious fractures.  The right hip appears well seated.  Preop hemoglobin 13.7, hematocrit 39.4%, white count 13,700, platelets 252,000.  Discharge hemoglobin 10.9, hematocrit 31.0%, white count 12,000, platelets 143,000.  Preop chemistries:  Sodium 137, potassium 3.7, chloride 100, CO2 29, glucose 79, BUN 12, creatinine 0.7, calcium 9.0, total protein 7.3, albumin 3.0, AST 14, ALT 12, ALP 101, total bilirubin 0.7.  Discharge chemistries:  Sodium 135, potassium 3.4, chloride 101, CO2 29, glucose 133, BUN 7, creatinine 0.6, calcium 7.5.  Urinalysis was benign for a voided urine. She was blood type O positive, antibody screen negative.  DISCHARGE DISPOSITION:  She was transferred to rehab where she will continue with her physical and occupational therapy.  CONDITION ON DISCHARGE:  Discharged in improve condition. DD:  06/12/00 TD:  06/12/00 Job: [id number]

## 2010-12-01 NOTE — Procedures (Signed)
[facility]  Patient:    [name], [name]                      MRN: [id number] Proc. Date: 12/15/99 Adm. Date:  [id number] Attending:  Thyra Breed CC:         Reinaldo Meeker, M.D.                           Procedure Report  PROCEDURE:  Lumbar epidural steroid injection.  DIAGNOSIS:  Lumbar degenerative disk disease and facet joint arthritis with underlying herniated disk and mild stenosis.  HISTORY:  [name] is a 77 year old who was sent to [place] by Dr. Aliene Beams for a series of two lumbar epidural steroid injections.  The patient states that she had low back pain beginning in October, radiating out to the right lower extremity.  She was initially evaluated by Dr. Eulah Pont and treated with pain pills, physical therapy, and a series of epidural steroid injections in December at [facility].  She noted a mild to moderate improvement. She has deteriorated, and a CT myelogram was performed after she was evaluated by Dr. Gerlene Fee, which demonstrated moderate-sized left herniation at L3-4, degenerative disk disease at multiple levels, and mild stenosis.  She also has some facet joint arthritis.  Dr. Sherene Sires, her primary care physician, has treated her with Demerol that she states is helpful.  In the past, she has been on Vicodin and Darvocet with no overall improvement.  The patient describes an intense pain which is made worse by weightbearing, predominantly in the right knee region.  She has no numbness or tingling but does have weakness over her right thigh region.  She denied bowel or bladder incontinence except with stress incontinence.  Her pain is made worse by rolling in the bed or any weightbearing.  It is decreased by ice packs and by lying flat in bed.  MEDICATIONS:  Cozaar, furosemide, Ogen, Prozac, Citrucel, Ascription one a day, Centrum, Tylenol, Rhinocort, Norvasc, Prilosec, K-Dur, Vioxx, and Demerol, as well as Levsin.  ALLERGIES:   TREE POLLENS.  FAMILY HISTORY:  Positive for coronary artery disease, cancer, low back pain, hypertension.  PAST SURGICAL HISTORY:  Significant for gallbladder surgery, right rotator cuff repair, hysterectomy, left shoulder arthroscopy, and bilateral hip replacement.  SOCIAL HISTORY:  The patient is a nonsmoker, nondrinker.  She raised six children.  ACTIVE MEDICAL PROBLEMS:  Asthma/rhinitis, for which she sees Dr. Sherene Sires, tension headaches, hypertension, osteoarthritis, gastroesophageal reflux disease, irritable bowel syndrome, varicose veins with peripheral edema.  REVIEW OF SYSTEMS:   GENERAL:  Significant for night sweats.  HEENT:  Head: Tension headache.  Eyes negative.  Nose and throat negative.  Ears negative. PULMONARY:  Significant for asthma.  CARDIOVASCULAR:  Significant for hypertension.  GI:  Significant for gastroesophageal reflux disease.  GU: Significant for stress incontinence.  MUSCULOSKELETAL/NEUROLOGIC:  See HPI, no history of seizure or stroke.  CUTANEOUS:  Significant for a perineal eruption when she was hospitalized last.  HEMATOLOGIC:  Negative.  ENDOCRINE: Negative. PSYCHIATRIC:  Significant for anxiety and depression: ALLERGY/IMMUNOLOGIC:  Positive for tree allergies.  PHYSICAL EXAMINATION:  VITAL SIGNS:  Blood pressure is 157/81, heart rate is 90, respiratory rate 16, O2 saturation 96%, pain level 10 out of 10, and temperature is 98.2.  GENERAL:  This is a pleasant female in no acute distress.  HEENT:  Head was normocephalic, atraumatic.  Eyes:  Extraocular movements  intact with conjunctivae and sclerae clear.  She does have some mild exophthalmos.  Nose:  Patent nares.  Oropharynx is free of lesions.  NECK:  Modest restriction of range of motion with carotids 2+ and symmetric without bruits.  LUNGS:  Clear.  HEART:  Regular rate and rhythm.  BACK:  The patient demonstrated marked kyphosis with mild scoliosis.  Straight leg raise signs were  negative.  BREASTS, ABDOMEN, PELVIC, RECTAL:  Not performed.  EXTREMITIES:  Extremities demonstrate some varicosities to the lower extremities.  Her dorsalis pedis and radial pulses are 2+ and symmetric.  The right knee demonstrated a marked valgus deformity of about 30 degrees.  NEUROLOGIC:  The patient was oriented x 4.  Cranial nerves II-XII were grossly intact.  Deep tendon reflexes were symmetric in the upper and lower extremities with downgoing toes, with the exception of a right knee, which was depressed relative to the left.  Straight leg raise signs were negative. Motor demonstrated 5/5 with no difference in bulk and tone.  IMPRESSION: 1. Low back pain radiating out to the right knee area with myelogram    suggesting more left-sided pathology, with underlying degenerative disk    disease, facet joint arthritis, and a herniated disk to the left, but it is    paracentral. 2. Multiple other medical problems, including asthma, osteoarthritis,    tension headaches, gastroesophageal reflux disease, hypertension,    depression, and tree allergies per Dr. Sherene Sires.  DISPOSITION:  I discussed the potential risks, benefits, and limitations of a lumbar epidural steroid injection in detail with the patient as well as her husband.  They had their questions answered.  DESCRIPTION OF PROCEDURE:  After informed consent was obtained, the patient was placed in the sitting position and monitored.  Her back was prepped with Betadine x 3.  A skin wheal was raised at the L4-5 interspace with 1% lidocaine.  A 20 gauge Tuohy needle was introduced into the lumbar epidural space and loss of resistance to preservative-free normal saline.  There was no CSF nor blood.  Medrol 80 mg in 10 ml of preservative-free normal saline was gently injected.  The needle was flushed with preservative-free normal saline and removed intact.  POSTPROCEDURE CONDITION:  Stable.  DISCHARGE INSTRUCTIONS: 1. Resume  previous diet. 2. Limitation on activities per instruction sheet. 3. Discontinue Demerol.  4. Percocet 5/325 one p.o. q.6h. p.r.n., #100 with no refill. 5. Phenergan 25 mg one p.o. q.6h. p.r.n. nausea, #50 with two refills. 6. Follow up with me in one week for second injection. DD:  12/15/99 TD:  12/19/99 Job: [id number]

## 2010-12-01 NOTE — Discharge Summary (Signed)
[place]. [facility]  Patient:    [name], [name]                      MRN: [id number] Adm. Date:  [id number] Disc. Date: [id number] Attending:  Herold Harms Dictator:   Dian Situ, PA CC:         Charlaine Dalton. Sherene Sires, M.D. [facility]  Loreta Ave, M.D.   Discharge Summary  DISCHARGE DIAGNOSES: 1. Status post right total knee replacement. 2. Postoperative anemia. 3. Escherichia coli urinary tract infection, treated.  HISTORY OF PRESENT ILLNESS:  [name] is a 77 year old female with history of hypertension, asthma, DJD, who elected to undergo right total hip replacement but developed pain in buttock and loosening of her acetabular component.  She underwent right total hip revision November 5 by Dr. Eulah Pont and postop is touchdown weightbearing.  She remains on Coumadin for DVT prophylaxis with INR 3.6 today.  She has had issues with drop in hemoglobin and hematocrit to 7.5 and 21.9 and was transfused with 2 units of packed red blood cells.  She has also been noted to have problems maintaining touchdown weightbearing status.  Currently, she is moderate assistance for bed mobility, moderate assistance for ambulating three feet with a standard walker.  PAST MEDICAL HISTORY:  Significant for right total knee replacement in 1992, left total hip replacement in 1991, right rotator cuff repair in 1985, left rotator cuff repair in 1993, history of esophageal ulcer, depression, asthma, insomnia, cholecystectomy, hysterectomy, hypertension.  ALLERGIES:  CODEINE and PENICILLIN.  SOCIAL HISTORY:  The patient is married, lives with husband in two-level home with two steps at entry.  She was independent with ambulation but limited secondary to pain.  She needed some assistance with ADLs.  She does not use any tobacco or alcohol.  HOSPITAL COURSE:  [name] was admitted to rehabilitation on May 23, 2000, with patient therapies to  consist of PT and OT daily.  Past admission, the patient was maintained on Coumadin for DVT prophylaxis.  She also remains on iron supplementation for postop anemia.  Pain control has been improved with p.r.n. use of Vicodin.  Bilateral duplex were done past admission and showed no evidence of DVT.  At time of discharge, patients PT/INR was 17.5, 1.7, and the patient is discharged on Coumadin 4 mg a day to continue through November 28.  Home health R.N. has been arrange to draw pro time Monday with results to Coumadin Clinic at [facility].  Labs this admission show hemoglobin 10.6, hematocrit 29.3, white count 8.4, platelets 196.  Sodium 136, potassium 3.1, chloride 97, CO2 32, BUN 6, creatinine 0.5.  As the patients hypokalemia was supplemented with Kay-Ciel, recheck past supplementation shows stability.  Electrolytes of November 13 show sodium 137, potassium 3.9, chloride 96, CO2 32, BUN 11, creatinine 0.6, glucose 106.  Her hemoglobin and hematocrit were rechecked and shows improvement with hemoglobin at 11.0 and hematocrit 32.1.  Stool guaiac x 1 checked and and has been negative.  She was noted to have E. coli UTI and has been treated with Tequin for five days.  Her wound initially was clean and dry; however, she was noted to have some serosanguineous drainage with some erythema of surrounding tissue.   The patients antibiotics were changed to Keflex for better staph coverage, and she was discharged on Keflex to continue for at least one week until followup.  Staples remain intact, and patient to follow up with Dr.  Murphy in three to five days for staple removal.  Blood pressure has been stable, and patient has been afebrile during her stay.  P.O. intake has been good.  During her stay in rehabilitation, the patient progressed to modified independence for bed mobility, modified independence for transfers, modified independence for ambulating 60 feet with standard walker.  She  requires minimal assistance to navigate 4 steps.  In terms of ADLs, the patient is modified independent for upper and lower body care with assistive device, requires minimal assistance for shower transfers.  Her husband will provide assistance as needed past discharge.  Followup therapies in terms of home health include PT which has been set up with [facility]. Home health R.N. has been arranged for pro time draws.  On May 29, 2000, the patient is discharged to home.  DISCHARGE MEDICATIONS:  1. Coumadin 4 mg a day.  2. Keflex 250 mg q.i.d.  3. Vicodin 1 to 2 p.o. q.4-6h. p.r.n. pain.  4. Trinsicon 1 p.o. b.i.d.  5. Humibid 600 mg b.i.d.  6. Prilosec 40 mg b.i.d.  7. Lasix 20 mg per day.  8. Prozac 40 mg per day.  9. Ogen 1.25 mg per day. 10. Os-Cal with vitamin D 500 mg t.i.d. 11. Beconase nasal spray 1 squirt each nostril per day. 12. Norvasc 5 mg per day.  ACTIVITY:  She is to walker, total hip precautions.  DIET:  Low salt.  WOUND CARE:  Keep area clean and dry.  SPECIAL INSTRUCTIONS:  The patient is to resume aspirin once Coumadin is discontinued on November 28.  FOLLOWUP:  The patient is to follow up with Dr. Sherene Sires and Dr. Eulah Pont for recheck in the next few days.  Follow up with Dr. Lamar Benes as needed. DD:  06/13/00 TD:  06/13/00 Job: [id number]

## 2010-12-01 NOTE — Op Note (Signed)
[place]. [facility]  Patient:    [name], [name]                      MRN: [id number] Proc. Date: 05/20/00 Adm. Date:  [id number] Attending:  Colbert Ewing                           Operative Report  PREOPERATIVE DIAGNOSIS:  Aseptic loosening, total hip replacement right side, secondary to superior acetabular degenerative cyst.  POSTOPERATIVE DIAGNOSIS:  Aseptic loosening, total hip replacement right side, secondary to superior acetabular degenerative cyst.  PROCEDURE: 1. Revision right total hip replacement with conversion of acetabular    component to a 58 mm metal-backed cup, screw fixation x 2, and 10 degree    polyethylene insert. 2. Cancellous allograft to superior acetabular cyst. 3. Revision femoral component head to a 28 mm plus 0 head.  SURGEON:  Loreta Ave, M.D.  ASSISTANTS:  Alinda Deem, M.D., and Oris Drone. Petrarca, P.A.-C.  ANESTHESIA:  General.  ESTIMATED BLOOD LOSS:  750 cc.  SPECIMENS:  Excised soft tissue.  CULTURES:  None.  COMPLICATIONS:  None.  DRESSINGS:  Soft compressive with abduction pillow.  DESCRIPTION OF PROCEDURE:  Patient brought to the operating room and after adequate anesthesia had been obtained, turned to a lateral position, right side up.  Prepped and draped in the usual sterile fashion.  Previous incision was utilized for a posterior approach to the hip.  Skin and subcutaneous tissue divided, extending the incision slightly proximal and distal. Iliotibial band exposed and incised.  Thickened trochanteric bursa excised. Neurovascular structures identified and protected.  External rotators and capsule taken down off the back of the femur, exposing the joint.  The metallic component of the femur was exposed.  The hip was dislocated posteriorly with obvious gross loosening of the acetabular component.  The femoral head was removed from the femoral component, and the stem was tested and had  good, firm fixation.  The acetabulum exposed.  I could remove the entire cup, liner, and one of the screws, which were grossly loose from the acetabulum failure because of bone stock loss from the degenerative superior acetabular cyst.  One retained screw within the acetabulum was able to be accessed and removed.  The entire acetabulum exposed, removing soft tissue around the rim.  Thickened, fibrous debris throughout was all debrided.  No evidence of infection, just a clear effusion.  Once I had the entire acetabulum exposed, the margins were cleared.  All soft tissue curetted from all recesses.  Sequential reaming of the acetabulum up to 58 mm, which gave me good seating and placement of the cup.  The defect in the superior aspect of the acetabulum was curetted out and then firmly packed with cancellous bone, further packed in by running the acetabular reamer on reverse.  Trials were excellent for a 58 mm component.  The definitive component was placed and hammered in place, placing it at 45 degrees of abduction and 20 of anteversion.  Good capturing and fixation with good coverage throughout. There was a very reasonable anterior and posterior wall of the acetabulum, the main defect being superiorly.  The reinforcement of the cup was then accomplished utilizing two large 6.5 screws, which were placed superior and slightly posterior with predrilling, tapping, and then firmly tightening down the 6.5 mm screws, utilizing a 40 and then a 35 mm screw.  The polyethylene insert  was then placed with the overhang placed posterosuperior.  The femur was then re-exposed.  Trials were assessed, and I chose a 28 mm plus 0 head, which was then firmly placed on the femur.  The hip was then reduced. Excellent stability in flexion and extension with good restoration of leg length.  Stable at 90 of flexion, 20 of abduction, and more than 45 of internal rotation.  The wound was then copiously irrigated.   External rotators, capsule, and a portion of the abductor superiorly were all then firmly reattached to the trochanter utilizing drill holes through bone and overtying nonabsorbable #1 suture over bone.  I was able to salvage posterior capsule and external rotators so that I had a good reinforcement of the posterior wall.  These were tied over bone.  The wound was then copiously irrigated.  Meticulous hemostasis with cautery.  The Charnley retractor, which had been previously placed, was removed.  The iliotibial band closed with #1 Vicryl, skin and subcutaneous tissue with Vicryl and then staples.  The margins of the wound injected with Marcaine.  Sterile compressive dressing applied.  Returned to the supine position.  The anesthesia reversed, brought to the recovery room.  Tolerated the surgery well, no complications. DD:  05/22/00 TD:  05/22/00 Job: [id number]

## 2010-12-01 NOTE — Op Note (Signed)
[place]. [facility]  Patient:    [name], [name] Visit Number: [id number] MRN: [id number]          Service Type: SUR Location: [id number] Attending Physician:  Colbert Ewing Dictated by:   Loreta Ave, M.D. Proc. 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  PROCEDURE:  Revision left total hip arthroplasty.  Revision of acetabular component with insertion of a new polyethylene liner 28 mm internal diameter, cross fire series 2 type, 10 degree overhang.  Revision femoral component with a 28 mm plus 0 C taper head.  SURGEON:  Loreta Ave, M.D.  ASSISTANT:  Arlys John D. Petrarca, P.A.-C.  ANESTHESIA:  General.  ESTIMATED BLOOD LOSS:  200 cc.  BLOOD GIVEN:  None.  SPECIMENS EXCISED:  Components.  Culture obtained of the joint fluid within the joint, no obvious infection.  COMPLICATIONS:  None.  DRESSING:  Soft compressive with abduction pillow.  DESCRIPTION OF PROCEDURE:  The patient was brought to the operating room placed on the operating room table in the supine position.  After adequate anesthesia was then obtained, turned to a lateral position left side up. Prepped and draped in usual sterile fashion.  Mild shortening left leg because of wear of components.  After being prepped and draped, utilized the previous incision on the lateral cortex of the femur extending posterior superior. Skin and subcutaneous tissues divided.  Incision down to the iliotibial band which was then incised with hemostasis with electrocautery.  ______ retractor put in place.  External rotator and capsule taken down off the  back of the intertrochanteric groove of the femur.  Some articulate debris within the joint and reactive synovitis all excised.  No evidence of infection.  Clear fluid was sent for aerobic and anaerobic culture.  Obvious wear of both the femoral head and acetabular line.  The hip was dislocated, exposing the joint. Surrounding necrotic debris, scar tissue, all excised.  The metallic portion of the femoral head on the femur removed.  The remaining femoral stem was well boned ingrown, very solid, and did not require further revision of that component.  Acetabulum exposed.  Excessive wear within the polyethylene.  This was extracted by placing a drill into the polyethylene and then a screw that was screwed in the polyethylene, forcing the polyethylene out of the metallic cup.  Removed without difficulty.  The metallic cup was then thoroughly assessed and was firmly fixed within the acetabulum, and that portion did not need to be revised.  Position of the femoral stem and the metallic cup of the acetabulum were excellent.  Copious irrigation with a pulse irrigating device as well as antibiotic solution.  A new 28 mm internal diameter cross fire acetabular component was then hammered into place into the metallic acetabular component with a 10 degree overhang placed posterosuperior.  Excellent capturing and fixation.  The femur was then revised to a new 28 mm head which was attached to the femoral stem.  Prior to this, trials had been used to determine these sizes to be appropriate.  After the  definitive components were in place, the hip was reduced.  Excellent motion and stability both in flexion and extension with good restoration of leg lengths.  Wound irrigated. External rotator and capsule which had been tagged with #1 Ethibond were repaired on the back of the intertrochanteric groove on the femur, and over tied over bony bridges.  Wound irrigated.  The iliotibial band closed with #1 Vicryl,  skin and subcutaneous tissue with Vicryl and staples.  The margin of the wound injected with Marcaine.  Sterile compressive dressing applied. Returned to the supine position.  Abduction pillow placed.  Anesthesia reversed.  Brought to the recovery room.  Tolerated surgery well with no complications. Dictated by:   Loreta Ave, M.D. Attending Physician:  Colbert Ewing DD:  05/22/01 TD:  05/24/01 Job: [id number]

## 2010-12-07 ENCOUNTER — Ambulatory Visit: Payer: Medicare Other | Admitting: Adult Health

## 2010-12-08 ENCOUNTER — Encounter: Payer: Self-pay | Admitting: Adult Health

## 2010-12-08 ENCOUNTER — Ambulatory Visit (INDEPENDENT_AMBULATORY_CARE_PROVIDER_SITE_OTHER): Payer: Medicare Other | Admitting: Adult Health

## 2010-12-08 DIAGNOSIS — J069 Acute upper respiratory infection, unspecified: Secondary | ICD-10-CM

## 2010-12-08 MED ORDER — AZITHROMYCIN 250 MG PO TABS: 250.0000 mg | ORAL_TABLET | Freq: Every day | ORAL | Status: AC

## 2010-12-08 NOTE — Patient Instructions (Signed)
Zpack take as directed.  Mucinex DM Twice daily  As needed  Cough/congestion Fluids and rest  Please contact office for sooner follow up if symptoms do not improve or worsen or seek emergency care  follow up Dr. Sherene Sires  In 6 weeks

## 2010-12-12 NOTE — Progress Notes (Signed)
Subjective:    Patient ID: [name], female    DOB: June 27, 1931, 77 y.o.   MRN: [id number]  HPI 25 yowf with minimal smoking hx with morbid obesity and  history of asthmatic bronchitis, hypertension, osteoarthritis and mild dementia.   August 16, 2009 ov for surgical clearance very sedentary, rarely goes out except to eat. last walked at a department store maybe a month ago. not doing any mentally challenging activities either. however,     October 31, 2009-ov  follow up and med review. We reviewed meds today and updated her med calendar. She still does not feel good, w/ decreased appetite, labs were essentially unremarkable except for low K+   January 04, 2010 ov c/o decrease in appetite over the past month. She also c/o some occ difficulty with swallowing. Has gerd per GI (Dr Doreatha Martin) denies non adherence with rx as outlined in med calendar and inventoried accurately under present meds. sore on sacrum no better, using donut to sit. Urgency also but no dysuria. --stopped lasix, pepcid added, decreased k-dur.   February 07, 2010 -returns for follow up 4 week follow up - states appetite and swallowing have improved. Last visit Pepcid added. She is going to wound care center in [facility] for sacral decub.      September 22, 2010 ov "I'm no better" but per husband her only complaint is back pain and she rarely uses the as needed advil she has on her list and rarlely the vicodin.   rec follow med calendar > did not increase pain meds as rec per calendar but "pain better" per husband.  3/28/12ov cc dizzy not nauseated better lying down, poor appetite, no vomiting, fever, diarrhea.>>norvasc stopped , labs done -not remarkable  11/09/10 Follow up  Pt returns with her husband for follow up . Seen last ov for intermittent dizziness. Her Norvasc was stopped with supsected  Side effects and over compensated b/p control. She says she feels better dizziness is almost entirely gone. Has had only a few episodes since  last ov.  She says she is tired today and is ready to go back home. Husband says she is becoming more and more sedentary and does not want to go out anywhere. Her appetite remains low and weight is trending down over last year.Labs done with cbc and bmet last ov were unrevealing to explain dizziness or weight loss. TSH in last year ok.  Remains on aricept and namenda for dementia. Husband says her memory comes and goes with intermittent confusion, no flare in symptoms. No urinary symptoms. No abd pain or n/v/d.  Does complains of yeast under breast and skin folds. Has had problems with this in past. Is out of cream used previously.  >rec destin.   12/08/10 Acute OV  Pt presents for a work in visit. Complains of chest congestion, prod cough with white mucus, increased SOB, night sweats for last 1 week. OTC not helping. Cough is getting worse. No hemoptysis. Has some drainage. No vomitting or chest pain.  No sinus pain or pressure.        Review of Systems Constitutional:   +  weight loss, no night sweats,  Fevers, chills,  +fatigue, and lassitude.  HEENT:   No headaches,  Difficulty swallowing,  Tooth/dental problems, or  Sore throat,                No sneezing, itching, ear ache, nasal congestion, post nasal drip,   CV:  No chest pain,  Orthopnea, PND, swelling in lower extremities, anasarca, , palpitations, syncope.   GI  No heartburn, indigestion, abdominal pain, nausea, vomiting, diarrhea, change in bowel habits, loss of appetite, bloody stools.   Resp: No shortness of breath with exertion or at rest.  No excess mucus, no productive cough,  No non-productive cough,  No coughing up of blood.  No change in color of mucus.  No wheezing.  No chest wall deformity  Skin: no rash or lesions.  GU: no dysuria, change in color of urine, no urgency or frequency.  No flank pain, no hematuria   MS:  No joint pain or swelling.  No decreased range of motion.   o memory loss  Psych:    +memory  issues          Objective:   Physical Exam GEN: A/Ox2 ;  NAD, flat affect   HEENT:  [place]/AT,  EACs-clear, TMs-wnl, NOSE-clear drainage , THROAT-clear, no lesions, no postnasal drip or exudate noted.   NECK:  Supple w/ fair ROM; no JVD; normal carotid impulses w/o bruits; no thyromegaly or nodules palpated; no lymphadenopathy.  RESP  Clear  P & A; w/o, wheezes/ rales/ or rhonchi.no accessory muscle use, no dullness to percussion  CARD:  RRR, no m/r/g  , no peripheral edema, pulses intact, no cyanosis or clubbing.  GI:   Soft & nt; nml bowel sounds; no organomegaly or masses detected.  Musco: Warm bil, no deformities or joint swelling noted.   Neuro: alert, no focal deficits noted.  Alert to person and place.    Skin: Warm, no lesions or rashes           Assessment & Plan:

## 2010-12-12 NOTE — Assessment & Plan Note (Signed)
Zpack take as directed.  Mucinex DM Twice daily  As needed  Cough/congestion Fluids and rest  Please contact office for sooner follow up if symptoms do not improve or worsen or seek emergency care  follow up Dr. Wert  In 6 weeks   

## 2011-01-02 ENCOUNTER — Emergency Department (INDEPENDENT_AMBULATORY_CARE_PROVIDER_SITE_OTHER): Payer: Medicare Other

## 2011-01-02 ENCOUNTER — Emergency Department (HOSPITAL_BASED_OUTPATIENT_CLINIC_OR_DEPARTMENT_OTHER)
Admission: EM | Admit: 2011-01-02 | Discharge: 2011-01-02 | Disposition: A | Discharge status: Home / Self Care | Payer: Medicare Other | Attending: Emergency Medicine | Admitting: Emergency Medicine

## 2011-01-02 DIAGNOSIS — Z8679 Personal history of other diseases of the circulatory system: Secondary | ICD-10-CM | POA: Insufficient documentation

## 2011-01-02 DIAGNOSIS — M25559 Pain in unspecified hip: Secondary | ICD-10-CM

## 2011-01-02 DIAGNOSIS — W19XXXA Unspecified fall, initial encounter: Secondary | ICD-10-CM

## 2011-01-02 DIAGNOSIS — S7000XA Contusion of unspecified hip, initial encounter: Secondary | ICD-10-CM | POA: Insufficient documentation

## 2011-01-02 DIAGNOSIS — R51 Headache: Secondary | ICD-10-CM

## 2011-01-02 DIAGNOSIS — K219 Gastro-esophageal reflux disease without esophagitis: Secondary | ICD-10-CM | POA: Insufficient documentation

## 2011-01-02 DIAGNOSIS — M542 Cervicalgia: Secondary | ICD-10-CM

## 2011-01-02 DIAGNOSIS — F0390 Unspecified dementia without behavioral disturbance: Secondary | ICD-10-CM | POA: Insufficient documentation

## 2011-01-02 DIAGNOSIS — G319 Degenerative disease of nervous system, unspecified: Secondary | ICD-10-CM | POA: Insufficient documentation

## 2011-01-02 DIAGNOSIS — Z79899 Other long term (current) drug therapy: Secondary | ICD-10-CM | POA: Insufficient documentation

## 2011-01-02 DIAGNOSIS — I10 Essential (primary) hypertension: Secondary | ICD-10-CM | POA: Insufficient documentation

## 2011-01-02 DIAGNOSIS — M47812 Spondylosis without myelopathy or radiculopathy, cervical region: Secondary | ICD-10-CM

## 2011-01-02 DIAGNOSIS — Y92009 Unspecified place in unspecified non-institutional (private) residence as the place of occurrence of the external cause: Secondary | ICD-10-CM | POA: Insufficient documentation

## 2011-01-02 DIAGNOSIS — N390 Urinary tract infection, site not specified: Secondary | ICD-10-CM | POA: Insufficient documentation

## 2011-01-02 LAB — DIFFERENTIAL
Basophils Absolute: 0 10*3/uL (ref 0.0–0.1)
Lymphocytes Relative: 19 % (ref 12–46)
Lymphs Abs: 1.7 10*3/uL (ref 0.7–4.0)
Monocytes Absolute: 0.9 10*3/uL (ref 0.1–1.0)
Neutro Abs: 6.3 10*3/uL (ref 1.7–7.7)

## 2011-01-02 LAB — URINALYSIS, ROUTINE W REFLEX MICROSCOPIC
Ketones, ur: NEGATIVE mg/dL
Protein, ur: NEGATIVE mg/dL
Urobilinogen, UA: 0.2 mg/dL (ref 0.0–1.0)

## 2011-01-02 LAB — COMPREHENSIVE METABOLIC PANEL
Alkaline Phosphatase: 96 U/L (ref 39–117)
BUN: 13 mg/dL (ref 6–23)
CO2: 26 mEq/L (ref 19–32)
Chloride: 100 mEq/L (ref 96–112)
Creatinine, Ser: 0.8 mg/dL (ref 0.50–1.10)
GFR calc Af Amer: >60 mL/min (ref >60)
GFR calc non Af Amer: >60 mL/min (ref >60)
Glucose, Bld: 114 mg/dL — ABNORMAL HIGH (ref 70–99)
Potassium: 3.9 mEq/L (ref 3.5–5.1)
Total Bilirubin: 0.3 mg/dL (ref 0.3–1.2)

## 2011-01-02 LAB — URINE MICROSCOPIC-ADD ON

## 2011-01-02 LAB — CBC
HCT: 44.2 % (ref 36.0–46.0)
Hemoglobin: 15.4 g/dL — ABNORMAL HIGH (ref 12.0–15.0)
MCHC: 34.8 g/dL (ref 30.0–36.0)

## 2011-01-03 ENCOUNTER — Telehealth: Payer: Self-pay | Admitting: Internal Medicine

## 2011-01-03 DIAGNOSIS — F0390 Unspecified dementia without behavioral disturbance: Secondary | ICD-10-CM

## 2011-01-03 NOTE — Telephone Encounter (Signed)
Order placed for nurse assessment to determine pt needs. Pt husband aware.Carron Curie, CMA

## 2011-01-03 NOTE — Telephone Encounter (Signed)
Called, spoke with pt's husband.  States pt fell yesterday and was seen in the ED for this.  States she was d/c'd with rx for home health care for 4 hours per day x 2 wks.  However, they have called [facility] and in [facility] and are being told they do not offer this.  Requesting some help with this.  I see in the ED note where pt was d/c'd with rx for home health but do not see any specifics as to what type or how long it needs to be for.  Dr. Sherene Sires, pls advise if order can be sent to [facility] so they can try to get this taken care of for pt.  If so, are you ok with order for home health care for 4 hours per day x 2 wks or do you have other recs? Pls advise. Thanks!

## 2011-01-03 NOTE — Telephone Encounter (Signed)
Ok with me but really needs a Child psychotherapist eval to do an assessment of her outpt needs.  [facility] is fine

## 2011-01-05 ENCOUNTER — Telehealth: Payer: Self-pay | Admitting: Internal Medicine

## 2011-01-05 LAB — URINE CULTURE: Culture  Setup Time: 201206200552

## 2011-01-05 NOTE — Telephone Encounter (Signed)
Called and spoke with pts husband and i explained to him that [facility] picked the order up this morning and he should hear from them by Monday.  He voiced his understanding of this.

## 2011-01-08 ENCOUNTER — Telehealth: Payer: Self-pay | Admitting: Internal Medicine

## 2011-01-08 NOTE — Telephone Encounter (Signed)
I called [facility] and they state they have the order and the pt is set to get a call tonight from the nursing staff to set an appt for tomorrow. I advised the the pt husband of this. Carron Curie, CMA

## 2011-01-19 ENCOUNTER — Encounter: Payer: Self-pay | Admitting: Internal Medicine

## 2011-01-19 ENCOUNTER — Other Ambulatory Visit (INDEPENDENT_AMBULATORY_CARE_PROVIDER_SITE_OTHER): Payer: Medicare Other | Admitting: Internal Medicine

## 2011-01-19 ENCOUNTER — Ambulatory Visit (INDEPENDENT_AMBULATORY_CARE_PROVIDER_SITE_OTHER): Payer: Medicare Other | Admitting: Internal Medicine

## 2011-01-19 ENCOUNTER — Other Ambulatory Visit (INDEPENDENT_AMBULATORY_CARE_PROVIDER_SITE_OTHER): Payer: Medicare Other

## 2011-01-19 VITALS — BP 130/88 | HR 94 | Temp 97.8°F

## 2011-01-19 DIAGNOSIS — Z0000 Encounter for general adult medical examination without abnormal findings: Secondary | ICD-10-CM

## 2011-01-19 DIAGNOSIS — R112 Nausea with vomiting, unspecified: Secondary | ICD-10-CM

## 2011-01-19 DIAGNOSIS — I10 Essential (primary) hypertension: Secondary | ICD-10-CM

## 2011-01-19 DIAGNOSIS — N390 Urinary tract infection, site not specified: Secondary | ICD-10-CM

## 2011-01-19 LAB — BASIC METABOLIC PANEL
BUN: 11 mg/dL (ref 6–23)
Chloride: 104 mEq/L (ref 96–112)
Creatinine, Ser: 0.8 mg/dL (ref 0.4–1.2)
GFR: 72.61 mL/min (ref >60.00)
Glucose, Bld: 134 mg/dL — ABNORMAL HIGH (ref 70–99)
Potassium: 3.8 mEq/L (ref 3.5–5.1)

## 2011-01-19 LAB — CBC WITH DIFFERENTIAL/PLATELET
Basophils Absolute: 0 10*3/uL (ref 0.0–0.1)
HCT: 44 % (ref 36.0–46.0)
Lymphs Abs: 1.3 10*3/uL (ref 0.7–4.0)
MCV: 98 fl (ref 78.0–100.0)
Monocytes Absolute: 0.6 10*3/uL (ref 0.1–1.0)
Neutrophils Relative %: 73.5 % (ref 43.0–77.0)
Platelets: 181 10*3/uL (ref 150.0–400.0)
RDW: 15.1 % — ABNORMAL HIGH (ref 11.5–14.6)
WBC: 8.5 10*3/uL (ref 4.5–10.5)

## 2011-01-19 LAB — HEPATIC FUNCTION PANEL
Bilirubin, Direct: 0.1 mg/dL (ref 0.0–0.3)
Total Bilirubin: 0.5 mg/dL (ref 0.3–1.2)

## 2011-01-19 MED ORDER — ALPRAZOLAM 0.25 MG PO TABS: 0.2500 mg | ORAL_TABLET | Freq: Three times a day (TID) | ORAL | Status: DC | PRN

## 2011-01-19 NOTE — Patient Instructions (Signed)
  See Tammy NP w/in 2 weeks with all your medications, even over the counter meds, separated in two separate bags, the ones you take no matter what vs the ones you stop once you feel better and take only as needed when you feel you need them.   Tammy  will generate for you a new user friendly medication calendar that will put us all on the same page re: your medication use.     Without this process, it simply isn't possible to assure that we are providing  your outpatient care  with  the attention to detail we feel you deserve.   If we cannot assure that you're getting that kind of care,  then we cannot manage your problem effectively from this clinic.  Once you have seen Tammy and we are sure that we're all on the same page with your medication use she will arrange follow up with me.  

## 2011-01-21 ENCOUNTER — Encounter: Payer: Self-pay | Admitting: Internal Medicine

## 2011-01-21 NOTE — Assessment & Plan Note (Addendum)
I had an extended discussion with the patient and husband  today lasting 15 to 20 minutes of a 25 minute visit on the following issues:   This is a chronic/ recurrent problem already addressed by her med calendar prns and GI medicine  Needs to first try her prns then contact her GI doctor if not responding adequately.  See instructions for specific recommendations which were reviewed directly with the patient who was given a copy with highlighter outlining the key components.

## 2011-01-21 NOTE — Progress Notes (Signed)
  Subjective:    Patient ID: [name], female    DOB: 15-Jul-1931, 77 y.o.   MRN: [id number]  HPI 10 yowf with minimal smoking hx with morbid obesity and  history of asthmatic bronchitis, hypertension, osteoarthritis and mild dementia.   January 04, 2010 ov c/o decrease in appetite over the past month. She also c/o some occ difficulty with swallowing. Has gerd per GI (Dr Doreatha Martin) denies non adherence with rx as outlined in med calendar and inventoried accurately under present meds. sore on sacrum no better, using donut to sit. Urgency also but no dysuria. --stopped lasix, pepcid added, decreased k-dur.   February 07, 2010 -returns for follow up 4 week follow up - states appetite and swallowing have improved. Last visit Pepcid added. She is going to wound care center in [facility] for sacral decub.      September 22, 2010 ov "I'm no better" but per husband her only complaint is back pain and she rarely uses the as needed advil she has on her list and rarlely the vicodin.   rec follow med calendar > did not increase pain meds as rec per calendar but "pain better" per husband.  3/28/12ov cc dizzy not nauseated better lying down, poor appetite, no vomiting, fever, diarrhea.>>norvasc stopped , labs done -not remarkable    12/08/10 Acute OV  Pt presents for a work in visit. Complains of chest congestion, prod cough with white mucus, increased SOB, night sweats for last 1 week. OTC not helping. Cough is getting worse. No hemoptysis. Has some drainage. No vomitting or chest pain.  No sinus pain or pressure.   01/19/11 ov/Kooper Godshall cc mutiple but all chronic x years, intermittent nausea, abd cramping, back pain, dysuria.  Not using med calendar for prns with all symptoms addressed reading from Left to right, husband relying on alphabetical printout given last ov instead of the more user friendly med calendar format provided.  No new symptoms  Pt denies any significant sore throat, dysphagia, itching, sneezing,  nasal  congestion or excess/ purulent secretions,  fever, chills, sweats, unintended wt loss, pleuritic or exertional cp, hempoptysis, orthopnea pnd or leg swelling.    Also denies any obvious fluctuation of symptoms with weather or environmental changes or other aggravating or alleviating factors.                   Objective:   Physical Exam GEN: A/Ox2 ;  NAD, flat affect   HEENT:  [place]/AT,  EACs-clear, TMs-wnl, NOSE-clear drainage , THROAT-clear, no lesions, no postnasal drip or exudate noted.   NECK:  Supple w/ fair ROM; no JVD; normal carotid impulses w/o bruits; no thyromegaly or nodules palpated; no lymphadenopathy.  RESP  Clear  P & A; w/o, wheezes/ rales/ or rhonchi.no accessory muscle use, no dullness to percussion  CARD:  RRR, no m/r/g  , no peripheral edema, pulses intact, no cyanosis or clubbing.  GI:   Soft & nt; nml bowel sounds; no organomegaly or masses detected.  Musco: Warm bil, no deformities or joint swelling noted.   Neuro: alert, no focal deficits noted.  Alert to person and place.    Skin: Warm, no lesions or rashes           Assessment & Plan:

## 2011-01-21 NOTE — Assessment & Plan Note (Addendum)
Adequate control on present rx, reviewed     Each maintenance medication was reviewed in detail including most importantly the difference between maintenance and as needed and under what circumstances the prns are to be used. This was done in the context of a medication calendar review which provided the patient with a user-friendly unambiguous mechanism for medication administration and reconciliation and provides an action plan for all active problems. It is critical that this be shown to every doctor  for modification during the office visit if necessary so the patient can use it as a working document.     

## 2011-01-22 LAB — URINALYSIS, ROUTINE W REFLEX MICROSCOPIC
Nitrite: NEGATIVE
Specific Gravity, Urine: 1.02 (ref 1.000–1.030)
Total Protein, Urine: NEGATIVE
Urine Glucose: NEGATIVE
pH: 6 (ref 5.0–8.0)

## 2011-01-22 NOTE — Progress Notes (Signed)
Quick Note:  Spoke with pt and notified of results per Dr. Wert. Pt verbalized understanding and denied any questions.  ______ 

## 2011-02-02 ENCOUNTER — Encounter: Payer: Self-pay | Admitting: Adult Health

## 2011-02-02 ENCOUNTER — Ambulatory Visit (INDEPENDENT_AMBULATORY_CARE_PROVIDER_SITE_OTHER): Payer: Medicare Other | Admitting: Adult Health

## 2011-02-02 DIAGNOSIS — F068 Other specified mental disorders due to known physiological condition: Secondary | ICD-10-CM

## 2011-02-02 NOTE — Patient Instructions (Addendum)
Stop Darvocet/Migrozone.  Follow med calendar closely and bring to each visit.  follow up Dr. Sherene Sires  In 6 weeks  Please contact office for sooner follow up if symptoms do not improve or worsen or seek emergency care

## 2011-02-06 NOTE — Progress Notes (Signed)
Subjective:    Patient ID: EDIT RICCIARDELLI, female    DOB: Dec 04, 1930, 77 y.o.   MRN: [id number]  HPI 27 yowf with minimal smoking hx with morbid obesity and  history of asthmatic bronchitis, hypertension, osteoarthritis and mild dementia.   January 04, 2010 ov c/o decrease in appetite over the past month. She also c/o some occ difficulty with swallowing. Has gerd per GI (Dr Doreatha Martin) denies non adherence with rx as outlined in med calendar and inventoried accurately under present meds. sore on sacrum no better, using donut to sit. Urgency also but no dysuria. --stopped lasix, pepcid added, decreased k-dur.   February 07, 2010 -returns for follow up 4 week follow up - states appetite and swallowing have improved. Last visit Pepcid added. She is going to wound care center in [facility] for sacral decub.      September 22, 2010 ov "I'm no better" but per husband her only complaint is back pain and she rarely uses the as needed advil she has on her list and rarlely the vicodin.   rec follow med calendar > did not increase pain meds as rec per calendar but "pain better" per husband.  3/28/12ov cc dizzy not nauseated better lying down, poor appetite, no vomiting, fever, diarrhea.>>norvasc stopped , labs done -not remarkable    12/08/10 Acute OV  Pt presents for a work in visit. Complains of chest congestion, prod cough with white mucus, increased SOB, night sweats for last 1 week. OTC not helping. Cough is getting worse. No hemoptysis. Has some drainage. No vomitting or chest pain.  No sinus pain or pressure.   01/19/11 ov/Wert cc mutiple but all chronic x years, intermittent nausea, abd cramping, back pain, dysuria.  Not using med calendar for prns with all symptoms addressed reading from Left to right, husband relying on alphabetical printout given last ov instead of the more user friendly med calendar format provided.  No new symptoms  02/02/11 Follow up and med review  Pt returns with husband. We reviewed all her  meds and organized her medication into her med calendar with pt education.  Mr. Annalee Genta takes care of all her meds . He uses a numbering system with the med calendar and pill bottles.  She is doing about the same. Says she is tired today and ready to get back home.  No chest pain or dyspnea.       Past Medical History:  COUGH (ICD-786.2)  DEPRESSION (ICD-311)  ANXIETY, CHRONIC (ICD-300.00)  COLONIC POLYPS (ICD-211.3)  DIVERTICULITIS, COLON (ICD-562.11)  - colonoscopy 03/21/2004  HYPERLIPIDEMIA, MIXED (ICD-272.2)  BARRETT'S ESOPHAGUS, HX OF (ICD-V12.79).........................[facility]  - EGD 03/21/2004  OBESITY (ICD-278.00)  - Target wt = < 158 for BMI < 30  UTI'S, CHRONIC (ICD-599.0)...........................................................Marland KitchenMarland KitchenPeterson  OSTEOARTHRITIS (ICD-715.90)  ASTHMATIC BRONCHITIS, ACUTE (ICD-466.0)  DEMENTIA, MILD (ICD-294.8)  HYPERTENSION (ICD-401.9)  RHINITIS (ICD-472.0)  Sacral Decubitus...........................................................................[facility]  -[facility] [place] 7/11  Skin Candida--breast/groin  --tx w/ diflucan 8/26, rx lotrisone  COMPLEX MED REGIMEN  ---.Meds reviewed with pt education and computerized med calendar completed/adjusted-08/19/2008, June 26, 2010 , 02/02/11      Constitutional:   No  weight loss, night sweats,  Fevers, chills, fatigue, or  lassitude.  HEENT:   No headaches,  Difficulty swallowing,  Tooth/dental problems, or  Sore throat,                No sneezing, itching, ear ache, nasal congestion, post nasal drip,   CV:  No  chest pain,  Orthopnea, PND, swelling in lower extremities, anasarca, dizziness, palpitations, syncope.   GI  No heartburn, indigestion, abdominal pain, nausea, vomiting, diarrhea, change in bowel habits, loss of appetite, bloody stools.   Resp: No shortness of breath with exertion or at rest.  No excess mucus, no productive cough,  No non-productive  cough,  No coughing up of blood.  No change in color of mucus.  No wheezing.  No chest wall deformity  Skin: no rash or lesions.  GU: no dysuria, change in color of urine, no urgency or frequency.  No flank pain, no hematuria   MS:  No joint pain or swelling.  No decreased range of motion.  No back pain.  Psych:  No change in mood or affect. No depression or anxiety.  No memory loss.                    Objective:   Physical Exam GEN: A/Ox2 ;  NAD, flat affect   HEENT:  [place]/AT,  EACs-clear, TMs-wnl, NOSE-clear drainage , THROAT-clear, no lesions, no postnasal drip or exudate noted.   NECK:  Supple w/ fair ROM; no JVD; normal carotid impulses w/o bruits; no thyromegaly or nodules palpated; no lymphadenopathy.  RESP  Clear  P & A; w/o, wheezes/ rales/ or rhonchi.no accessory muscle use, no dullness to percussion  CARD:  RRR, no m/r/g  , no peripheral edema, pulses intact, no cyanosis or clubbing.  GI:   Soft & nt; nml bowel sounds; no organomegaly or masses detected.  Musco: Warm bil, no deformities or joint swelling noted.   Neuro: alert, no focal deficits noted.  Alert to person and place.    Skin: Warm, no lesions or rashes           Assessment & Plan:

## 2011-02-06 NOTE — Assessment & Plan Note (Signed)
Patient's medications were reviewed today and patient education was given. Computerized medication calendar was adjusted/completed  PlaN:  Stop Darvocet/Migrozone.  Follow med calendar closely and bring to each visit.  follow up Dr. Sherene Sires  In 6 weeks  Please contact office for sooner follow up if symptoms do not improve or worsen or seek emergency care

## 2011-02-08 ENCOUNTER — Other Ambulatory Visit: Payer: Self-pay | Admitting: *Deleted

## 2011-02-08 MED ORDER — DEXBROMPHENIRAMINE-PSE ER 6-120 MG PO TB12: 1.0000 | ORAL_TABLET | Freq: Every day | ORAL | Status: DC | PRN

## 2011-02-08 MED ORDER — METHYLCELLULOSE (LAXATIVE) PO POWD: ORAL | Status: DC

## 2011-02-08 MED ORDER — HYOSCYAMINE SULFATE 0.125 MG PO TABS: ORAL_TABLET | ORAL | Status: DC

## 2011-02-08 MED ORDER — HYDROCODONE-ACETAMINOPHEN 5-500 MG PO TABS: 1.0000 | ORAL_TABLET | Freq: Four times a day (QID) | ORAL | Status: DC | PRN

## 2011-02-08 MED ORDER — VALACYCLOVIR HCL 1 G PO TABS: 1000.0000 mg | ORAL_TABLET | Freq: Every day | ORAL | Status: DC | PRN

## 2011-02-08 MED ORDER — OXYMETAZOLINE HCL 0.05 % NA SOLN: NASAL | Status: DC

## 2011-02-08 NOTE — Progress Notes (Signed)
7.20.12 med calendar update.

## 2011-02-26 NOTE — Assessment & Plan Note (Signed)
No evidence active infection

## 2011-03-21 ENCOUNTER — Ambulatory Visit (INDEPENDENT_AMBULATORY_CARE_PROVIDER_SITE_OTHER): Payer: Medicare Other | Admitting: Internal Medicine

## 2011-03-21 ENCOUNTER — Encounter: Payer: Self-pay | Admitting: Internal Medicine

## 2011-03-21 VITALS — BP 140/86 | HR 103 | Temp 97.7°F

## 2011-03-21 DIAGNOSIS — F068 Other specified mental disorders due to known physiological condition: Secondary | ICD-10-CM

## 2011-03-21 DIAGNOSIS — L8991 Pressure ulcer of unspecified site, stage 1: Secondary | ICD-10-CM

## 2011-03-21 NOTE — Patient Instructions (Addendum)
Please schedule a follow up office visit in 6 weeks with Tammy Parrett, call sooner if needed   Please see patient coordinator before you leave today  to schedule advance health care skin evaluation for decubitus  See calendar for specific medication instructions and bring it back for each and every office visit for every healthcare provider you see.  Without it,  you may not receive the best quality medical care that we feel you deserve.  You will note that the calendar groups together  your maintenance  medications that are timed at particular times of the day.  Think of this as your checklist for what your doctor has instructed you to do until your next evaluation to see what benefit  there is  to staying on a consistent group of medications intended to keep you well.  The other group at the bottom is entirely up to you to use as you see fit  for specific symptoms that may arise between visits that require you to treat them on an as needed basis.  Think of this as your action plan or "what if" list.   Separating the top medications from the bottom group is fundamental to providing you adequate care going forward.

## 2011-03-21 NOTE — Progress Notes (Signed)
Subjective:    Patient ID: [name], female    DOB: 1930/11/19, 77 y.o.   MRN: [id number]  HPI 66 yowf with minimal smoking hx with morbid obesity and  history of asthmatic bronchitis, hypertension, osteoarthritis and mild dementia.   January 04, 2010 ov c/o decrease in appetite over the past month. She also c/o some occ difficulty with swallowing. Has gerd per GI (Dr Doreatha Martin) denies non adherence with rx as outlined in med calendar and inventoried accurately under present meds. sore on sacrum no better, using donut to sit. Urgency also but no dysuria. --stopped lasix, pepcid added, decreased k-dur.   February 07, 2010 -returns for follow up 4 week follow up - states appetite and swallowing have improved. Last visit Pepcid added. She is going to wound care center in [facility] for sacral decub.      September 22, 2010 ov "I'm no better" but per husband her only complaint is back pain and she rarely uses the as needed advil she has on her list and rarlely the vicodin.   rec follow med calendar > did not increase pain meds as rec per calendar but "pain better" per husband.  3/28/12ov cc dizzy not nauseated better lying down, poor appetite, no vomiting, fever, diarrhea.>>norvasc stopped , labs done -not remarkable  02/02/11 Follow up and med review  Pt returns with husband. We reviewed all her meds and organized her medication into her med calendar with pt education.  Mr. Annalee Genta takes care of all her meds . He uses a numbering system with the med calendar and pill bottles.  She is doing about the same. Says she is tired today and ready to get back home.  No chest pain or dyspnea.  rec  Stop Darvocet/Migrozone.  Follow med calendar closely and bring to each visit  03/21/11 ov/Jenifer Struve cc generally weak, poor appeitite.  No n or v or D.  Husband concerned decubitus worse, already treated and released by [facility] wound care center.  Pt denies any significant sore throat, dysphagia, itching, sneezing,  nasal  congestion or excess/ purulent secretions,  fever, chills, sweats, unintended wt loss, pleuritic or exertional cp, hempoptysis, orthopnea pnd or leg swelling.  Also denies presyncope, palpitations, heartburn, abdominal pain, nausea, vomiting, diarrhea  or change in bowel or urinary habits, dysuria,hematuria,  rash, arthralgias, visual complaints, headache, numbness weakness or ataxia.  Also denies any obvious fluctuation of symptoms with weather or environmental changes or other aggravating or alleviating factors.        Past Medical History:  COUGH (ICD-786.2)  DEPRESSION (ICD-311)  ANXIETY, CHRONIC (ICD-300.00)  COLONIC POLYPS (ICD-211.3)  DIVERTICULITIS, COLON (ICD-562.11)  - colonoscopy 03/21/2004  HYPERLIPIDEMIA, MIXED (ICD-272.2)  BARRETT'S ESOPHAGUS, HX OF (ICD-V12.79)..............................[facility])  - EGD 03/21/2004  OBESITY (ICD-278.00)  - Target wt = < 158 for BMI < 30  UTI'S, CHRONIC (ICD-599.0)...........................................................Marland KitchenMarland KitchenPeterson  OSTEOARTHRITIS (ICD-715.90)  ASTHMATIC BRONCHITIS, ACUTE (ICD-466.0)  DEMENTIA, MILD (ICD-294.8)  HYPERTENSION (ICD-401.9)  RHINITIS (ICD-472.0)  Sacral Decubitus -wound care > referred to [facility] 03/21/2011    COMPLEX MED REGIMEN  ---.Meds reviewed with pt education and computerized med calendar completed/adjusted-08/19/2008, June 26, 2010 , 02/02/11                          Objective:   Physical Exam  wc bound  wf with hopeless affect  GEN: A/Ox2 ;  NAD   HEENT:  [place]/AT,  EACs-clear, TMs-wnl, NOSE-clear drainage , THROAT-clear, no  lesions, no postnasal drip or exudate noted.   NECK:  Supple w/ fair ROM; no JVD; normal carotid impulses w/o bruits; no thyromegaly or nodules palpated; no lymphadenopathy.  RESP  Clear  P & A; w/o, wheezes/ rales/ or rhonchi.no accessory muscle use, no dullness to percussion  CARD:  RRR, no m/r/g  , no peripheral edema, pulses intact, no  cyanosis or clubbing.  GI:   Soft & nt; nml bowel sounds; no organomegaly or masses detected.  Musco: Warm bil, no deformities or joint swelling noted.   Neuro: alert, no focal deficits noted.  Oriented to person and place but not date or day of week  Skin: Warm, no lesions or rashes           Assessment & Plan:

## 2011-03-22 ENCOUNTER — Encounter: Payer: Self-pay | Admitting: Internal Medicine

## 2011-03-22 NOTE — Assessment & Plan Note (Signed)
   Each maintenance medication was reviewed in detail including most importantly the difference between maintenance and as needed and under what circumstances the prns are to be used.  This was done in the context of a medication calendar review which provided the patient with a user-friendly unambiguous mechanism for medication administration and reconciliation and provides an action plan for all active problems. It is critical that this be shown to every doctor  for modification during the office visit if necessary so the patient can use it as a working document.   Please see instructions for details which were reviewed in writing and the patient given a copy.

## 2011-03-22 NOTE — Assessment & Plan Note (Signed)
I had an extended discussion with the patient/husband  today lasting 15 to 20 minutes of a 25 minute visit on the following issues:   Declines referral back to the wound center but explained this is not something we can address in this clinic and she is rapidly approaching either NHP or hospice level care > agreed to try Home health one more time but explained the limitations of this approach and the rules we have to follow for reimbursement.

## 2011-03-23 ENCOUNTER — Telehealth: Payer: Self-pay | Admitting: Internal Medicine

## 2011-03-23 MED ORDER — CITALOPRAM HYDROBROMIDE 20 MG PO TABS: 20.0000 mg | ORAL_TABLET | Freq: Every day | ORAL | Status: DC

## 2011-03-23 NOTE — Telephone Encounter (Signed)
Already on max prozac Rec try stop it and start citalopram 20 mg daily Should probably return to neuro in about 4-6 weeks to do re-eval of all of her neuro active meds

## 2011-03-23 NOTE — Telephone Encounter (Signed)
BJ BOONE RETURNED MINDY'S CALL

## 2011-03-23 NOTE — Telephone Encounter (Signed)
lmomtcb for bj boone to verify what is needed

## 2011-03-23 NOTE — Telephone Encounter (Signed)
I spoke with pt wife and is aware of mw recs. He verbalized understanding. Husband states pt will need a 90 day supply w/ 3 refills sent to [facility]. RX has been sent

## 2011-03-23 NOTE — Telephone Encounter (Signed)
I spoke with [name] and she states [name] husband wanted her to call our office to see if Dr. Sherene Sires would either increase [name]'s Prozac or change her to a different medication to help her depression. [name] husband has advised [name] that [name] has had an appetite decrease, feels tired, losing weight and is not "her jolly old self". Per [name] call husband back once MW addresses this. Please advise Dr. Sherene Sires. Thanks  Carver Fila, CMA

## 2011-04-10 ENCOUNTER — Telehealth: Payer: Self-pay | Admitting: Internal Medicine

## 2011-04-10 NOTE — Telephone Encounter (Signed)
LMTCB

## 2011-04-11 NOTE — Telephone Encounter (Signed)
lmomtcb  

## 2011-04-12 NOTE — Telephone Encounter (Signed)
lmomtcb for dennis 

## 2011-04-13 NOTE — Telephone Encounter (Signed)
LMTCB and will sign per procotol

## 2011-04-16 ENCOUNTER — Telehealth: Payer: Self-pay | Admitting: Internal Medicine

## 2011-04-16 DIAGNOSIS — R531 Weakness: Secondary | ICD-10-CM

## 2011-04-16 NOTE — Telephone Encounter (Signed)
Ok with me. Dx = weakness

## 2011-04-16 NOTE — Telephone Encounter (Signed)
[name] with [facility] feels that the pt would benefit from OT eval due to the pt's generalized weakness the trouble caring for herself. Pls advise if okay to send order for OT.

## 2011-04-16 NOTE — Telephone Encounter (Signed)
Order for OT sent to [facility] for [facility].

## 2011-04-25 LAB — I-STAT 8, (EC8 V) (CONVERTED LAB)
BUN: 15
Bicarbonate: 28.9 — ABNORMAL HIGH
HCT: 46
Operator id: 268271
TCO2: 30
pCO2, Ven: 43.1 — ABNORMAL LOW

## 2011-05-03 ENCOUNTER — Other Ambulatory Visit (INDEPENDENT_AMBULATORY_CARE_PROVIDER_SITE_OTHER): Payer: Medicare Other

## 2011-05-03 ENCOUNTER — Ambulatory Visit (INDEPENDENT_AMBULATORY_CARE_PROVIDER_SITE_OTHER): Payer: Medicare Other | Admitting: Adult Health

## 2011-05-03 ENCOUNTER — Encounter: Payer: Self-pay | Admitting: Adult Health

## 2011-05-03 VITALS — BP 132/60 | HR 78 | Temp 96.8°F | Ht 61.0 in | Wt 133.8 lb

## 2011-05-03 DIAGNOSIS — Z0000 Encounter for general adult medical examination without abnormal findings: Secondary | ICD-10-CM

## 2011-05-03 DIAGNOSIS — I10 Essential (primary) hypertension: Secondary | ICD-10-CM

## 2011-05-03 DIAGNOSIS — F068 Other specified mental disorders due to known physiological condition: Secondary | ICD-10-CM

## 2011-05-03 DIAGNOSIS — Z23 Encounter for immunization: Secondary | ICD-10-CM

## 2011-05-03 LAB — BASIC METABOLIC PANEL
CO2: 27 mEq/L (ref 19–32)
Creatinine, Ser: 0.9 mg/dL (ref 0.4–1.2)
GFR: 61.11 mL/min (ref >60.00)
Glucose, Bld: 145 mg/dL — ABNORMAL HIGH (ref 70–99)
Sodium: 143 mEq/L (ref 135–145)

## 2011-05-03 NOTE — Progress Notes (Signed)
Addended by: Darrell Jewel on: 05/03/2011 02:47 PM   Modules accepted: Orders

## 2011-05-03 NOTE — Patient Instructions (Addendum)
Flu shot today  We are setting you up for a mammogram .  Last colonoscopy was 2005  I will call with labs  follow up Dr. Sherene Sires  In 2-3 months and As needed

## 2011-05-03 NOTE — Assessment & Plan Note (Signed)
Controlled on rx   

## 2011-05-03 NOTE — Assessment & Plan Note (Signed)
Compensated on present regimen  With improved affect and mood on citalopram.  Cont on current reigmen.

## 2011-05-03 NOTE — Progress Notes (Signed)
Subjective:    Patient ID: [name], female    DOB: 1930/12/05, 77 y.o.   MRN: [id number]  HPI 30 yowf with minimal smoking hx with morbid obesity and  history of asthmatic bronchitis, hypertension, osteoarthritis and mild dementia.   January 04, 2010 ov c/o decrease in appetite over the past month. She also c/o some occ difficulty with swallowing. Has gerd per GI (Dr Doreatha Martin) denies non adherence with rx as outlined in med calendar and inventoried accurately under present meds. sore on sacrum no better, using donut to sit. Urgency also but no dysuria. --stopped lasix, pepcid added, decreased k-dur.   February 07, 2010 -returns for follow up 4 week follow up - states appetite and swallowing have improved. Last visit Pepcid added. She is going to wound care center in [facility] for sacral decub.      September 22, 2010 ov "I'm no better" but per husband her only complaint is back pain and she rarely uses the as needed advil she has on her list and rarlely the vicodin.   rec follow med calendar > did not increase pain meds as rec per calendar but "pain better" per husband.  3/28/12ov cc dizzy not nauseated better lying down, poor appetite, no vomiting, fever, diarrhea.>>norvasc stopped , labs done -not remarkable  02/02/11 Follow up and med review  Pt returns with husband. We reviewed all her meds and organized her medication into her med calendar with pt education.  Mr. Annalee Genta takes care of all her meds . He uses a numbering system with the med calendar and pill bottles.  She is doing about the same. Says she is tired today and ready to get back home.  No chest pain or dyspnea.  rec  Stop Darvocet/Migrozone.  Follow med calendar closely and bring to each visit  03/21/11 ov/Wert cc generally weak, poor appeitite.  No n or v or D.  Husband concerned decubitus worse, already treated and released by [facility] care center. >> HH eval for ulcer    05/03/2011 Follow up  She is here today with her husband  . She is in good spirits today, much happier today .  Pt was recommended to change from prozac to citalopram which has helped her appetite.  Over last week, husband says she is eating much better.     Sacral decub is better now. HH came out with dressing changes and would care  No fever or edema     Past Medical History:  COUGH (ICD-786.2)  DEPRESSION (ICD-311)  ANXIETY, CHRONIC (ICD-300.00)  COLONIC POLYPS (ICD-211.3)  DIVERTICULITIS, COLON (ICD-562.11)  - colonoscopy 03/21/2004  HYPERLIPIDEMIA, MIXED (ICD-272.2)  BARRETT'S ESOPHAGUS, HX OF (ICD-V12.79)..............................[facility]Dr [facility]  - EGD 03/21/2004  OBESITY (ICD-278.00)  - Target wt = < 158 for BMI < 30  UTI'S, CHRONIC (ICD-599.0)...........................................................Marland KitchenMarland KitchenPeterson  OSTEOARTHRITIS (ICD-715.90)  ASTHMATIC BRONCHITIS, ACUTE (ICD-466.0)  DEMENTIA, MILD (ICD-294.8)  HYPERTENSION (ICD-401.9)  RHINITIS (ICD-472.0)  Sacral Decubitus -wound care > [facility] to [facility] 03/21/2011    COMPLEX MED REGIMEN  ---.Meds reviewed with pt education and computerized med calendar completed/adjusted-08/19/2008, June 26, 2010 , 02/02/11  >>Health maintence mamogram 2012 >>                         Objective:   Physical Exam  wc bound  wf with flat affect   GEN: A/Ox2 ;  NAD   HEENT:  [place]/AT,  EACs-clear, TMs-wnl, NOSE-clear drainage , THROAT-clear, no lesions,  no postnasal drip or exudate noted.   NECK:  Supple w/ fair ROM; no JVD; normal carotid impulses w/o bruits; no thyromegaly or nodules palpated; no lymphadenopathy.  RESP  Clear  P & A; w/o, wheezes/ rales/ or rhonchi.no accessory muscle use, no dullness to percussion  CARD:  RRR, no m/r/g  , no peripheral edema, pulses intact, no cyanosis or clubbing.  GI:   Soft & nt; nml bowel sounds; no organomegaly or masses detected.  Musco: Warm bil, no deformities or joint swelling noted.   Neuro: alert, no focal deficits  noted.  Oriented to person and place but not date or day of week  Skin: Warm, no lesions or rashes           Assessment & Plan:

## 2011-05-15 ENCOUNTER — Telehealth: Payer: Self-pay | Admitting: Internal Medicine

## 2011-05-15 NOTE — Telephone Encounter (Signed)
I LMTCBx1 to advised Maurine Minister it is ok to extend for 4 more weeks. Carron Curie, CMA

## 2011-05-16 NOTE — Telephone Encounter (Signed)
lmomtcb for [name]

## 2011-05-17 NOTE — Telephone Encounter (Signed)
LMOMTCB x 1 

## 2011-05-18 ENCOUNTER — Other Ambulatory Visit: Payer: Self-pay | Admitting: Adult Health

## 2011-05-18 NOTE — Telephone Encounter (Signed)
Spoke with Maurine Minister and notified per MW okay to extend therapy x 4 more wks.

## 2011-05-22 ENCOUNTER — Telehealth: Payer: Self-pay | Admitting: Internal Medicine

## 2011-05-22 MED ORDER — PANTOPRAZOLE SODIUM 40 MG PO TBEC: 40.0000 mg | DELAYED_RELEASE_TABLET | Freq: Every day | ORAL | Status: DC

## 2011-05-22 NOTE — Telephone Encounter (Signed)
Refills sent. Pt aware.  Allyne Hebert, CMA  

## 2011-05-23 ENCOUNTER — Ambulatory Visit
Admission: RE | Admit: 2011-05-23 | Discharge: 2011-05-23 | Disposition: A | Discharge status: Home / Self Care | Payer: Medicare Other | Source: Ambulatory Visit | Attending: Adult Health | Admitting: Adult Health

## 2011-05-23 DIAGNOSIS — Z0000 Encounter for general adult medical examination without abnormal findings: Secondary | ICD-10-CM

## 2011-05-25 ENCOUNTER — Encounter: Payer: Self-pay | Admitting: Adult Health

## 2011-06-04 ENCOUNTER — Telehealth: Payer: Self-pay | Admitting: Internal Medicine

## 2011-06-04 MED ORDER — POTASSIUM CHLORIDE CRYS ER 20 MEQ PO TBCR: 20.0000 meq | EXTENDED_RELEASE_TABLET | ORAL | Status: DC

## 2011-06-04 NOTE — Telephone Encounter (Signed)
I spoke with the patient and she was told that we will send an rx for a 1 month supply to [facility] on [address] while she awaits mail order from [facility] on potassium. I told the patient to callback or have her husband callback if there were any questions.

## 2011-06-04 NOTE — Telephone Encounter (Signed)
lmomtcb  

## 2011-06-05 NOTE — Telephone Encounter (Signed)
LMOMTCB x 1 

## 2011-06-06 NOTE — Telephone Encounter (Signed)
I spoke with [name] and advised it was fine to extend physical therapy x 3 weeks

## 2011-08-20 ENCOUNTER — Telehealth: Payer: Self-pay | Admitting: Internal Medicine

## 2011-08-20 ENCOUNTER — Other Ambulatory Visit (INDEPENDENT_AMBULATORY_CARE_PROVIDER_SITE_OTHER): Payer: Medicare Other

## 2011-08-20 ENCOUNTER — Encounter: Payer: Self-pay | Admitting: Adult Health

## 2011-08-20 ENCOUNTER — Ambulatory Visit (INDEPENDENT_AMBULATORY_CARE_PROVIDER_SITE_OTHER): Payer: Medicare Other | Admitting: Adult Health

## 2011-08-20 VITALS — BP 126/78 | HR 73 | Temp 97.1°F | Ht 61.0 in | Wt 144.2 lb

## 2011-08-20 DIAGNOSIS — R300 Dysuria: Secondary | ICD-10-CM

## 2011-08-20 DIAGNOSIS — N390 Urinary tract infection, site not specified: Secondary | ICD-10-CM

## 2011-08-20 LAB — URINALYSIS, ROUTINE W REFLEX MICROSCOPIC: Nitrite: NEGATIVE

## 2011-08-20 MED ORDER — PANTOPRAZOLE SODIUM 40 MG PO TBEC: 40.0000 mg | DELAYED_RELEASE_TABLET | Freq: Every day | ORAL | Status: DC

## 2011-08-20 MED ORDER — MOMETASONE FUROATE 50 MCG/ACT NA SUSP: NASAL | Status: DC

## 2011-08-20 MED ORDER — CITALOPRAM HYDROBROMIDE 20 MG PO TABS: 20.0000 mg | ORAL_TABLET | Freq: Every day | ORAL | Status: DC

## 2011-08-20 MED ORDER — OXYBUTYNIN CHLORIDE 5 MG PO TABS: 5.0000 mg | ORAL_TABLET | Freq: Three times a day (TID) | ORAL | Status: DC

## 2011-08-20 MED ORDER — CLONIDINE HCL 0.1 MG PO TABS: ORAL_TABLET | ORAL | Status: DC

## 2011-08-20 MED ORDER — POTASSIUM CHLORIDE CRYS ER 20 MEQ PO TBCR: 20.0000 meq | EXTENDED_RELEASE_TABLET | ORAL | Status: DC

## 2011-08-20 MED ORDER — LEVOFLOXACIN 500 MG PO TABS: 500.0000 mg | ORAL_TABLET | Freq: Every day | ORAL | Status: DC

## 2011-08-20 MED ORDER — ESTROPIPATE 1.5 MG PO TABS: 1.5000 mg | ORAL_TABLET | ORAL | Status: DC

## 2011-08-20 NOTE — Patient Instructions (Signed)
Levaquin 500mg  daily for 7 days  Drink plenty of water.  Empty bladder completely  follow up Dr. Sherene Sires  As planned next week and As needed   Please contact office for sooner follow up if symptoms do not improve or worsen or seek emergency care

## 2011-08-20 NOTE — Telephone Encounter (Signed)
I spoke with pt spouse and he states the pt has had a foul odor to her urine x 3 days. She denies any pain with urination. They are requesting an appt. Appt set to see TP at 3:45. I advised the pt to go to the lab first and leave a urine sample to be tested.  Order placed for udip STAT and urine culture. Carron Curie, CMA

## 2011-08-20 NOTE — Assessment & Plan Note (Addendum)
UA +  Will treat for UTI   Plan:  Levaquin 500mg  daily for 7 days  Drink plenty of water.  Empty bladder completely  follow up Dr. Sherene Sires  As planned next week and As needed   Please contact office for sooner follow up if symptoms do not improve or worsen or seek emergency care

## 2011-08-20 NOTE — Progress Notes (Signed)
Subjective:    Patient ID: [name], female    DOB: Apr 08, 1931, 78 y.o.   MRN: [id number]  HPI 67 yowf with minimal smoking hx with morbid obesity and  history of asthmatic bronchitis, hypertension, osteoarthritis and mild dementia.   January 04, 2010 ov c/o decrease in appetite over the past month. She also c/o some occ difficulty with swallowing. Has gerd per GI (Dr Doreatha Martin) denies non adherence with rx as outlined in med calendar and inventoried accurately under present meds. sore on sacrum no better, using donut to sit. Urgency also but no dysuria. --stopped lasix, pepcid added, decreased k-dur.   February 07, 2010 -returns for follow up 4 week follow up - states appetite and swallowing have improved. Last visit Pepcid added. She is going to wound care center in [facility] for sacral decub.      September 22, 2010 ov "I'm no better" but per husband her only complaint is back pain and she rarely uses the as needed advil she has on her list and rarlely the vicodin.   rec follow med calendar > did not increase pain meds as rec per calendar but "pain better" per husband.  3/28/12ov cc dizzy not nauseated better lying down, poor appetite, no vomiting, fever, diarrhea.>>norvasc stopped , labs done -not remarkable  02/02/11 Follow up and med review  Pt returns with husband. We reviewed all her meds and organized her medication into her med calendar with pt education.  Mr. Annalee Genta takes care of all her meds . He uses a numbering system with the med calendar and pill bottles.  She is doing about the same. Says she is tired today and ready to get back home.  No chest pain or dyspnea.  rec  Stop Darvocet/Migrozone.  Follow med calendar closely and bring to each visit  03/21/11 ov/Wert cc generally weak, poor appeitite.  No n or v or D.  Husband concerned decubitus worse, already treated and released by [facility] care center. >> HH eval for ulcer    05/03/2011 Follow up  She is here today with her husband  . She is in good spirits today, much happier today .  Pt was recommended to change from prozac to citalopram which has helped her appetite.  Over last week, husband says she is eating much better.    Sacral decub is better now. HH came out with dressing changes and would care  No fever or edema  >no changes   08/20/2011 Acute OV  Complains of 1 week of urinary urgency and strong smelling urine.  No fever or back pain .Appetite is good.  She has been doing well for last few months.  Eating well.  Last UTI >8 months ago.     Past Medical History:  COUGH (ICD-786.2)  DEPRESSION (ICD-311)  ANXIETY, CHRONIC (ICD-300.00)  COLONIC POLYPS (ICD-211.3)  DIVERTICULITIS, COLON (ICD-562.11)  - colonoscopy 03/21/2004  HYPERLIPIDEMIA, MIXED (ICD-272.2)  BARRETT'S ESOPHAGUS, HX OF (ICD-V12.79)..............................[facility] (Dr [facility])  - EGD 03/21/2004  OBESITY (ICD-278.00)  - Target wt = < 158 for BMI < 30  UTI'S, CHRONIC (ICD-599.0)...........................................................Marland KitchenMarland KitchenPeterson  OSTEOARTHRITIS (ICD-715.90)  ASTHMATIC BRONCHITIS, ACUTE (ICD-466.0)  DEMENTIA, MILD (ICD-294.8)  HYPERTENSION (ICD-401.9)  RHINITIS (ICD-472.0)  Sacral Decubitus -wound care > [facility] to [facility] 03/21/2011    COMPLEX MED REGIMEN  ---.Meds reviewed with pt education and computerized med calendar completed/adjusted-08/19/2008, June 26, 2010 , 02/02/11  >>Health maintence mamogram 2012 >>  Objective:   Physical Exam  GEN: A/Ox2 ;  NAD   HEENT:  [place]/AT,  EACs-clear, TMs-wnl, NOSE-clear drainage , THROAT-clear, no lesions, no postnasal drip or exudate noted.   NECK:  Supple w/ fair ROM; no JVD; normal carotid impulses w/o bruits; no thyromegaly or nodules palpated; no lymphadenopathy.  RESP  Clear  P & A; w/o, wheezes/ rales/ or rhonchi.no accessory muscle use, no dullness to percussion  CARD:  RRR, no m/r/g  , no peripheral edema, pulses  intact, no cyanosis or clubbing.  GI:   Soft & nt; nml bowel sounds; no organomegaly or masses detected.no guarding or rebound  Neg CVA tenderness  Musco: Warm bil, no deformities or joint swelling noted.   Neuro: alert, no focal deficits noted.  Oriented to person and place  Skin: Warm, no lesions or rashes           Assessment & Plan:

## 2011-08-23 LAB — URINE CULTURE

## 2011-08-29 ENCOUNTER — Ambulatory Visit (INDEPENDENT_AMBULATORY_CARE_PROVIDER_SITE_OTHER): Payer: Medicare Other | Admitting: Internal Medicine

## 2011-08-29 ENCOUNTER — Encounter: Payer: Self-pay | Admitting: Internal Medicine

## 2011-08-29 DIAGNOSIS — F068 Other specified mental disorders due to known physiological condition: Secondary | ICD-10-CM

## 2011-08-29 DIAGNOSIS — I10 Essential (primary) hypertension: Secondary | ICD-10-CM

## 2011-08-29 DIAGNOSIS — N390 Urinary tract infection, site not specified: Secondary | ICD-10-CM

## 2011-08-29 NOTE — Assessment & Plan Note (Signed)
Adequate control on present rx, reviewed  

## 2011-08-29 NOTE — Assessment & Plan Note (Signed)
Seems well compensated now that on rx and husband doing better keeping up with medications    Each maintenance medication was reviewed in detail including most importantly the difference between maintenance and as needed and under what circumstances the prns are to be used. This was done in the context of a medication calendar review which provided the patient with a user-friendly unambiguous mechanism for medication administration and reconciliation and provides an action plan for all active problems. It is critical that this be shown to every doctor  for modification during the office visit if necessary so the patient can use it as a working document.

## 2011-08-29 NOTE — Progress Notes (Signed)
Subjective:    Patient ID: [name], female    DOB: 02/22/1931    MRN: [id number]  Brief patient profile:  79 yowf with minimal smoking hx with morbid obesity and  history of asthmatic bronchitis, hypertension, osteoarthritis and mild dementia.   January 04, 2010 ov c/o decrease in appetite over the past month. She also c/o some occ difficulty with swallowing. Has gerd per GI (Dr Doreatha Martin) denies non adherence with rx as outlined in med calendar and inventoried accurately under present meds. sore on sacrum no better, using donut to sit. Urgency also but no dysuria. --stopped lasix, pepcid added, decreased k-dur.   February 07, 2010 -returns for follow up 4 week follow up - states appetite and swallowing have improved. Last visit Pepcid added. She is going to wound care center in [facility] for sacral decub.        02/02/11 Follow up and med review  Pt returns with husband. We reviewed all her meds and organized her medication into her med calendar with pt education.  [name] takes care of all her meds . He uses a numbering system with the med calendar and pill bottles.  She is doing about the same. Says she is tired today and ready to get back home.  No chest pain or dyspnea.  rec  Stop Darvocet/Migrozone.  Follow med calendar closely and bring to each visit  03/21/11 ov/[name] cc generally weak, poor appeitite.  No n or v or D.  Husband concerned decubitus worse, already treated and released by [facility] care center. >> HH eval for ulcer    05/03/2011 Follow up  She is here today with her husband . She is in good spirits today, much happier today .  p change from prozac to citalopram which has helped her appetite.   Sacral decub is better now. HH came out with dressing changes and would care  No fever or edema  >no changes   08/20/2011 Acute OV  Complains of 1 week of urinary urgency and strong smelling urine.  No fever or back pain .Appetite is good.  She has been doing well for last  few months.  Eating well.  Last UTI >8 months ago rec Levaquin 500mg  daily for 7 days  Drink plenty of water.  Empty bladder completely .   08/29/2011 f/u ov/[name] cc continues mild gen weakness, no cough or sob. Husband doing much better keeping up with meds/ med calendar  Sleeping ok without nocturnal  or early am exacerbation  of respiratory  c/o's   . Also denies any obvious fluctuation of symptoms with weather or environmental changes or other aggravating or alleviating factors except as outlined above    Past Medical History:  COUGH (ICD-786.2)  DEPRESSION (ICD-311)  ANXIETY, CHRONIC (ICD-300.00)  COLONIC POLYPS (ICD-211.3)  DIVERTICULITIS, COLON (ICD-562.11)  - colonoscopy 03/21/2004  HYPERLIPIDEMIA, MIXED (ICD-272.2)  BARRETT'S ESOPHAGUS, HX OF (ICD-V12.79)..............................[facility] (Dr D[facility] Martin)  - EGD 03/21/2004  OBESITY (ICD-278.00)  - Target wt = < 158 for BMI < 30  UTI'S, CHRONIC (ICD-599.0)...........................................................Marland KitchenMarland KitchenPeterson  OSTEOARTHRITIS (ICD-715.90)  ASTHMATIC BRONCHITIS, ACUTE (ICD-466.0)  DEMENTIA, MILD (ICD-294.8)  HYPERTENSION (ICD-401.9)  RHINITIS (ICD-472.0)  Sacral Decubitus -wound care > [facility] to [facility] 03/21/2011    COMPLEX MED REGIMEN  ---.Meds reviewed with pt education and computerized med calendar completed/adjusted-08/19/2008, June 26, 2010 , 02/02/11  >>Health maintence mamogram 2012 >>  Objective:   Physical Exam  GEN: A/Ox2 ;  NAD   HEENT:  [place]/AT,  EACs-clear, TMs-wnl, NOSE-clear drainage , THROAT-clear, no lesions, no postnasal drip or exudate noted.   NECK:  Supple w/ fair ROM; no JVD; normal carotid impulses w/o bruits; no thyromegaly or nodules palpated; no lymphadenopathy.  RESP  Clear  P & A; w/o, wheezes/ rales/ or rhonchi.no accessory muscle use, no dullness to percussion  CARD:  RRR, no m/r/g  , no peripheral edema, pulses intact, no  cyanosis or clubbing.  GI:   Soft & nt; nml bowel sounds; no organomegaly or masses detected.no guarding or rebound  Neg CVA tenderness  Musco: Warm bil, no deformities or joint swelling noted.   Neuro: alert, no focal deficits noted.  Oriented to person and place  Skin: Warm, no lesions or rashes           Assessment & Plan:

## 2011-08-29 NOTE — Patient Instructions (Signed)
See calendar for specific medication instructions and bring it back for each and every office visit for every healthcare provider you see.  Without it,  you may not receive the best quality medical care that we feel you deserve.  You will note that the calendar groups together  your maintenance  medications that are timed at particular times of the day.  Think of this as your checklist for what your doctor has instructed you to do until your next evaluation to see what benefit  there is  to staying on a consistent group of medications intended to keep you well.  The other group at the bottom is entirely up to you to use as you see fit  for specific symptoms that may arise between visits that require you to treat them on an as needed basis.  Think of this as your action plan or "what if" list.   Separating the top medications from the bottom group is fundamental to providing you adequate care going forward.    Please schedule a follow up visit in 3 months but call sooner if needed to see Tammy with your present med list in hand

## 2011-08-29 NOTE — Assessment & Plan Note (Signed)
Symptomatically resolved p short course levaquin. No further rx for asymptomatic dz or screening for it in this setting as risk building up resistant organisms

## 2011-08-30 ENCOUNTER — Telehealth: Payer: Self-pay | Admitting: Internal Medicine

## 2011-08-30 MED ORDER — ESTROPIPATE 1.5 MG PO TABS: 1.5000 mg | ORAL_TABLET | ORAL | Status: DC

## 2011-08-30 NOTE — Telephone Encounter (Signed)
I spoke with spouse and he stated that [facility] advised him they did not receive the rx's TP had sent them except for citalopram and protonix. I called [facility] and was advised that pt ahs 2 accounts and they did receive all 7 rx's under pt's name [place] but the one pt spouse had gave them was the last name w/o the apostrophe. Also [facility] stated the pt will need to contact them bc they need to confirm information on pt's account but could not tell me what it was regarding. I called and made spouse aware of this and stated he will call them tomorrow. Also he stated pt's estrogen needed to sent to [facility] for a 30 day supply. i advised will send rx. Nothing further was needed

## 2011-09-15 ENCOUNTER — Emergency Department (INDEPENDENT_AMBULATORY_CARE_PROVIDER_SITE_OTHER): Payer: Medicare Other

## 2011-09-15 ENCOUNTER — Encounter (HOSPITAL_BASED_OUTPATIENT_CLINIC_OR_DEPARTMENT_OTHER): Payer: Self-pay | Admitting: *Deleted

## 2011-09-15 ENCOUNTER — Emergency Department (HOSPITAL_BASED_OUTPATIENT_CLINIC_OR_DEPARTMENT_OTHER)
Admission: EM | Admit: 2011-09-15 | Discharge: 2011-09-15 | Disposition: A | Discharge status: Home Health | Payer: Medicare Other | Attending: Emergency Medicine | Admitting: Emergency Medicine

## 2011-09-15 DIAGNOSIS — Z7982 Long term (current) use of aspirin: Secondary | ICD-10-CM | POA: Insufficient documentation

## 2011-09-15 DIAGNOSIS — F068 Other specified mental disorders due to known physiological condition: Secondary | ICD-10-CM | POA: Insufficient documentation

## 2011-09-15 DIAGNOSIS — R918 Other nonspecific abnormal finding of lung field: Secondary | ICD-10-CM

## 2011-09-15 DIAGNOSIS — R059 Cough, unspecified: Secondary | ICD-10-CM

## 2011-09-15 DIAGNOSIS — R0602 Shortness of breath: Secondary | ICD-10-CM | POA: Insufficient documentation

## 2011-09-15 DIAGNOSIS — Z79899 Other long term (current) drug therapy: Secondary | ICD-10-CM | POA: Insufficient documentation

## 2011-09-15 DIAGNOSIS — E782 Mixed hyperlipidemia: Secondary | ICD-10-CM | POA: Insufficient documentation

## 2011-09-15 DIAGNOSIS — R05 Cough: Secondary | ICD-10-CM

## 2011-09-15 DIAGNOSIS — M1990 Unspecified osteoarthritis, unspecified site: Secondary | ICD-10-CM | POA: Insufficient documentation

## 2011-09-15 DIAGNOSIS — I10 Essential (primary) hypertension: Secondary | ICD-10-CM | POA: Insufficient documentation

## 2011-09-15 DIAGNOSIS — F341 Dysthymic disorder: Secondary | ICD-10-CM | POA: Insufficient documentation

## 2011-09-15 DIAGNOSIS — J189 Pneumonia, unspecified organism: Secondary | ICD-10-CM | POA: Insufficient documentation

## 2011-09-15 MED ORDER — AZITHROMYCIN 250 MG PO TABS
500.0000 mg | ORAL_TABLET | Freq: Once | ORAL | Status: AC
  Administered 2011-09-15: 500 mg via ORAL
  Filled 2011-09-15: qty 2

## 2011-09-15 MED ORDER — AZITHROMYCIN 250 MG PO TABS: 250.0000 mg | ORAL_TABLET | Freq: Every day | ORAL | Status: DC

## 2011-09-15 MED ORDER — AZITHROMYCIN 250 MG PO TABS: 250.0000 mg | ORAL_TABLET | Freq: Every day | ORAL | Status: AC

## 2011-09-15 NOTE — ED Provider Notes (Signed)
History     CSN: [id number]  Arrival date & time 09/15/11  4098   First MD Initiated Contact with Patient 09/15/11 [phone]      Chief Complaint  Patient presents with  . Shortness of Breath    (Consider location/radiation/quality/duration/timing/severity/associated sxs/prior treatment) HPI Patient is a 78 year old female with a history of dementia who presents today in the care of her husband and son who were concerned about her shortness of breath. Patient also has a nonproductive cough. She has no history of asthma, COPD, or smoking. She denies any chest pain. Patient is afebrile and hemodynamically stable. She is very immobile at baseline per family. She does not have any history of DVT or PE. Patient does appear to have a very weak cough. She is at her neurologic baseline. Family reports that they just brought the patient today as they wanted to know if she might need an antibiotic. There are no other associated or modifying factors. Past Medical History  Diagnosis Date  . Cough   . Depressive disorder, not elsewhere classified   . Anxiety state, unspecified   . Benign neoplasm of colon   . Diverticulitis of colon (without mention of hemorrhage)   . Mixed hyperlipidemia   . Obesity, unspecified   . Personal history of other diseases of digestive system   . Urinary tract infection, site not specified   . Osteoarthrosis, unspecified whether generalized or localized, unspecified site   . Acute bronchitis   . Other persistent mental disorders due to conditions classified elsewhere   . Unspecified essential hypertension   . Chronic rhinitis     Past Surgical History  Procedure Date  . Cholecystectomy   . Total hip arthroplasty   . Vesicovaginal fistula closure w/ tah   . Rotator cuff repair     Family History  Problem Relation Age of Onset  . Breast cancer Mother   . Colon cancer Mother     History  Substance Use Topics  . Smoking status: Never Smoker   . Smokeless  tobacco: Never Used  . Alcohol Use: No    OB History    Grav Para Term Preterm Abortions TAB SAB Ect Mult Living                  Review of Systems  Constitutional: Negative.   HENT: Negative.   Eyes: Negative.   Respiratory: Positive for cough.   Cardiovascular: Negative.   Gastrointestinal: Negative.   Genitourinary: Negative.   Musculoskeletal: Negative.   Skin: Negative.   Neurological: Negative.   Hematological: Negative.   Psychiatric/Behavioral: Negative.   All other systems reviewed and are negative.    Allergies  Penicillins  Home Medications   Current Outpatient Rx  Name Route Sig Dispense Refill  . ALPRAZOLAM 0.25 MG PO TABS Oral Take 1 tablet (0.25 mg total) by mouth every 8 (eight) hours as needed. 60 tablet 2  . ASPIRIN 325 MG PO TBEC Oral Take 325 mg by mouth daily.      . AZITHROMYCIN 250 MG PO TABS Oral Take 1 tablet (250 mg total) by mouth daily. 4 each 0  . CITALOPRAM HYDROBROMIDE 20 MG PO TABS Oral Take 1 tablet (20 mg total) by mouth daily. 90 tablet 3  . CLONIDINE HCL 0.1 MG PO TABS  TAKE ONE-HALF (1/2) TABLET TWICE A DAY 90 tablet 3  . DEXBROMPHENIRAMINE-PSE ER 6-120 MG PO TB12 Oral Take 1 tablet by mouth daily as needed (for drippy nose).    Marland Kitchen  ESTROPIPATE 1.5 MG PO TABS Oral Take 1 tablet (1.5 mg total) by mouth every morning. 30 tablet 0  . EXELON 4.6 MG/24HR TD PT24  Apply 1 patch daily    . FAMOTIDINE 20 MG PO TABS Oral Take 20 mg by mouth at bedtime.      Marland Kitchen HYDROCODONE-ACETAMINOPHEN 5-500 MG PO TABS Oral Take 1 tablet by mouth every 6 (six) hours as needed (for severe pain).    Marland Kitchen HYOSCYAMINE SULFATE 0.125 MG PO TABS  1-2 every 6 hours as needed for abdominal cramps    . IBUPROFEN 200 MG PO TABS Oral Take 200 mg by mouth 3 (three) times daily with meals.      Marland Kitchen MEMANTINE HCL 10 MG PO TABS Oral Take 10 mg by mouth 2 (two) times daily.      . METHYLCELLULOSE (LAXATIVE) PO POWD  1 teaspoon mixed in 8oz of water every morning    . MOMETASONE  FUROATE 50 MCG/ACT NA SUSP  1-2 sprays each nostril twice daily 51 g 3  . MULTIVITAMINS PO CAPS Oral Take 1 capsule by mouth daily.      . OXYBUTYNIN CHLORIDE 5 MG PO TABS Oral Take 1 tablet (5 mg total) by mouth 3 (three) times daily. 270 tablet 3  . OXYMETAZOLINE HCL 0.05 % NA SOLN  Twice daily for 5 days before Nasonex    . PANTOPRAZOLE SODIUM 40 MG PO TBEC Oral Take 1 tablet (40 mg total) by mouth daily before breakfast. 90 tablet 3  . POTASSIUM CHLORIDE CRYS ER 20 MEQ PO TBCR Oral Take 1 tablet (20 mEq total) by mouth every morning. 90 tablet 3  . PROMETHAZINE HCL 25 MG PO TABS Oral Take 25 mg by mouth every 6 (six) hours as needed.      . TRIAMTERENE-HCTZ 75-50 MG PO TABS Oral Take 1 tablet by mouth daily as needed.      Marland Kitchen VALACYCLOVIR HCL 1 G PO TABS Oral Take 1 tablet (1,000 mg total) by mouth daily as needed.      BP 150/65  Pulse 67  Temp(Src) 98 F (36.7 C) (Oral)  Resp 20  SpO2 96%  Physical Exam  Nursing note and vitals reviewed. GEN: Well-developed, well-nourished female in no distress HEENT: Atraumatic, normocephalic. Oropharynx clear without erythema EYES: PERRLA BL, no scleral icterus. NECK: Trachea midline, no meningismus CV: regular rate and rhythm. No murmurs, rubs, or gallops PULM: No respiratory distress.  No crackles, wheezes, or rales.  GI: soft, non-tender. No guarding, rebound, or tenderness. + bowel sounds  Neuro: cranial nerves 2-12 intact, patient is at her neurologic baseline. She is oriented to self. Patient is nonambulatory at baseline. She is able to move the lower extremities but has significant weakness. Family reports this is unchanged.  MSK: Patient moves all 4 extremities symmetrically, no deformity, edema, or injury noted Psych: no abnormality of mood   ED Course  Procedures (including critical care time)  Labs Reviewed - No data to display Dg Chest 2 View  09/15/2011  *RADIOLOGY REPORT*  Clinical Data: Cough, shortness of breath  CHEST - 2  VIEW  Comparison: 09/06/2009  Findings: Relatively low lung volumes with some patchy subsegmental atelectasis or less likely early infiltrates in the   posterior basilar segments of both lower lobes.  No effusion.  Heart size within normal limits for technique and degree of inspiration. Tortuous atheromatous thoracic aorta.  Mild thoracic scoliosis with associated degenerative changes.  Orthopedic anchor in the right humeral head.  Degenerative changes in the left shoulder.  IMPRESSION:  Low volumes with patchy posterior lower lobe atelectasis or early infiltrate.  Original Report Authenticated By: Thora Lance III, M.D.     1. Cough   2. CAP (community acquired pneumonia)       MDM  Patient presented today with report of cough that is minimally productive. Patient has no history of pulmonary disease. In discussion with her family they were only concerned as to whether the patient might need antibiotics. We discussed the possibility of something like a PE playing into her shortness of breath but their concern was really more over her cough. Patient has been afebrile. Patient did have chest x-ray was concerning for an early infiltrate. Patient's x-ray was reviewed by myself it was not very impressive for possible pneumonia. I was comfortable treating her with azithromycin. Patient also was given a flutter valve to assist her with improving her mucus clearance. Family was instructed on how to use this. Patient was discharged home with a prescription for azithromycin. Family was told to return if she develops fevers worsening symptoms or other emergent concerns. Otherwise they can followup with the patient's primary care physician. Patient had no risk factors for healthcare acquired pneumonia.        Cyndra Numbers, MD 09/15/11 1517

## 2011-09-15 NOTE — ED Notes (Signed)
Patient woke up this morning with shortness of breath.

## 2011-09-15 NOTE — Discharge Instructions (Signed)

## 2011-09-15 NOTE — ED Notes (Signed)
Flutter valve instructed with patient and family at this time. Left at bedside to take home. RT will continue to monitor.

## 2011-11-23 ENCOUNTER — Telehealth: Payer: Self-pay | Admitting: Adult Health

## 2011-11-23 MED ORDER — CIPROFLOXACIN HCL 500 MG PO TABS: ORAL_TABLET | ORAL | Status: DC

## 2011-11-23 NOTE — Telephone Encounter (Signed)
Just go ahead and rx cipro 500 one half x 5 days then f/u with Tammy NP - if confusion worsens will need to consider admit vs hospice care at home as nothing else to offer

## 2011-11-23 NOTE — Telephone Encounter (Signed)
Called, spoke with pt's spouse.  He is aware MW recs pt go ahead and start cipro 500 mg 1/2 tablet x 5 days and to keep f/u with TP on 11/30/11 at 3:30.  Advised if pt's symptoms worsen or do not improve to call back or seek emergency care if needed.  He verbalized understanding of these instructions and is in agreement with this plan.  Rx sent to [facility] on [address] - spouse aware.

## 2011-11-23 NOTE — Telephone Encounter (Signed)
Called, spoke with pt's spouse who states he believes pt has a UTI.  Reports she is imaging things x several days.  Also, states she is having difficulty urinating at times, pain in lower back, urine does have an odor and is darker than normal.  Pt has a pending OV with TP on 11/30/11.  Therefore, pt's spouse would like to just bring in urine specimen if ok.  Dr. Sherene Sires, pls advise if this is ok.  Thank you.

## 2011-11-28 ENCOUNTER — Ambulatory Visit: Payer: Medicare Other | Admitting: Adult Health

## 2011-11-30 ENCOUNTER — Ambulatory Visit: Payer: Medicare Other | Admitting: Adult Health

## 2011-12-06 ENCOUNTER — Encounter: Payer: Self-pay | Admitting: Adult Health

## 2011-12-06 ENCOUNTER — Ambulatory Visit (INDEPENDENT_AMBULATORY_CARE_PROVIDER_SITE_OTHER): Payer: Medicare Other | Admitting: Adult Health

## 2011-12-06 VITALS — BP 124/68 | HR 104 | Temp 97.1°F | Ht 61.0 in | Wt 145.6 lb

## 2011-12-06 DIAGNOSIS — L8990 Pressure ulcer of unspecified site, unspecified stage: Secondary | ICD-10-CM

## 2011-12-06 DIAGNOSIS — L8991 Pressure ulcer of unspecified site, stage 1: Secondary | ICD-10-CM

## 2011-12-06 DIAGNOSIS — F068 Other specified mental disorders due to known physiological condition: Secondary | ICD-10-CM

## 2011-12-06 DIAGNOSIS — I10 Essential (primary) hypertension: Secondary | ICD-10-CM

## 2011-12-06 NOTE — Patient Instructions (Signed)
Continue on same meds.  follow up Dr. Sherene Sires  In 3 months   Try to keep mobile , change positions often during daytime.  Avoid sitting or lying on back for prolonged periods- shift weight to avoid pressure on buttock

## 2011-12-06 NOTE — Progress Notes (Signed)
Subjective:    Patient ID: [name], female    DOB: Oct 22, 1930    MRN: [id number]  Brief patient profile:  57 yowf with minimal smoking hx with morbid obesity and  history of asthmatic bronchitis, hypertension, osteoarthritis and mild dementia.   January 04, 2010 ov c/o decrease in appetite over the past month. She also c/o some occ difficulty with swallowing. Has gerd per GI (Dr Doreatha Martin) denies non adherence with rx as outlined in med calendar and inventoried accurately under present meds. sore on sacrum no better, using donut to sit. Urgency also but no dysuria. --stopped lasix, pepcid added, decreased k-dur.   February 07, 2010 -returns for follow up 4 week follow up - states appetite and swallowing have improved. Last visit Pepcid added. She is going to wound care center in [facility] for sacral decub.    02/02/11 Follow up and med review  Pt returns with husband. We reviewed all her meds and organized her medication into her med calendar with pt education.  Mr. Annalee Genta takes care of all her meds . He uses a numbering system with the med calendar and pill bottles.  She is doing about the same. Says she is tired today and ready to get back home.  No chest pain or dyspnea.  rec  Stop Darvocet/Migrozone.  Follow med calendar closely and bring to each visit  03/21/11 ov/Wert cc generally weak, poor appeitite.  No n or v or D.  Husband concerned decubitus worse, already treated and released by [facility] wound care center. >> HH eval for ulcer    05/03/2011 Follow up  She is here today with her husband . She is in good spirits today, much happier today .  p change from prozac to citalopram which has helped her appetite.   Sacral decub is better now. HH came out with dressing changes and would care  No fever or edema  >no changes   08/20/2011 Acute OV  Complains of 1 week of urinary urgency and strong smelling urine.  No fever or back pain .Appetite is good.  She has been doing well for last few  months.  Eating well.  Last UTI >8 months ago rec Levaquin 500mg  daily for 7 days  Drink plenty of water.  Empty bladder completely .   08/29/2011 f/u ov/Wert cc continues mild gen weakness, no cough or sob. Husband doing much better keeping up with meds/ med calendar >>no changes   12/06/2011 Follow up  Returns for 3 month follow up .  Appetite has improved , and weight is closer to her normal since beginning exelon patch started end of 2012 .  Continue to use Destin and A/D ointment to buttock.  We discussed her sedentary lifestyle and need to change positions frequently          Past Medical History:  COUGH (ICD-786.2)  DEPRESSION (ICD-311)  ANXIETY, CHRONIC (ICD-300.00)  COLONIC POLYPS (ICD-211.3)  DIVERTICULITIS, COLON (ICD-562.11)  - colonoscopy 03/21/2004  HYPERLIPIDEMIA, MIXED (ICD-272.2)  BARRETT'S ESOPHAGUS, HX OF (ICD-V12.79).........................[facility]  - EGD 03/21/2004  OBESITY (ICD-278.00)  - Target wt = < 158 for BMI < 30  UTI'S, CHRONIC (ICD-599.0)...........................................................Marland KitchenMarland KitchenPeterson  OSTEOARTHRITIS (ICD-715.90)  ASTHMATIC BRONCHITIS, ACUTE (ICD-466.0)  DEMENTIA, MILD (ICD-294.8)  HYPERTENSION (ICD-401.9)  RHINITIS (ICD-472.0)  Sacral Decubitus -wound care > referred to [facility] 03/21/2011    COMPLEX MED REGIMEN  ---.Meds reviewed with pt education and computerized med calendar completed/adjusted-08/19/2008, June 26, 2010 , 02/02/11  >>Health maintence mamogram  2012 >>     ROS:  Constitutional:   No  weight loss, night sweats,  Fevers, chills,  +fatigue, or  lassitude.  HEENT:   No headaches,  Difficulty swallowing,  Tooth/dental problems, or  Sore throat,                No sneezing, itching, ear ache, nasal congestion, post nasal drip,   CV:  No chest pain,  Orthopnea, PND, swelling in lower extremities, anasarca, dizziness, palpitations, syncope.   GI  No heartburn, indigestion, abdominal  pain, nausea, vomiting, diarrhea, change in bowel habits, loss of appetite, bloody stools.   Resp:  .  No excess mucus, no productive cough,  No non-productive cough,  No coughing up of blood.  No change in color of mucus.  No wheezing.  No chest wall deformity  Skin: no rash or lesions.  GU: no dysuria, change in color of urine, no urgency or frequency.  No flank pain, no hematuria   MS:  No joint pain or swelling.  No decreased range of motion.                              Objective:   Physical Exam  GEN: A/Ox2 ;  NAD , elderly and frail   HEENT:  [place]/AT,  EACs-clear, TMs-wnl, NOSE-clear drainage , THROAT-clear, no lesions, no postnasal drip or exudate noted.   NECK:  Supple w/ fair ROM; no JVD; normal carotid impulses w/o bruits; no thyromegaly or nodules palpated; no lymphadenopathy.  RESP  Clear  P & A; w/o, wheezes/ rales/ or rhonchi.no accessory muscle use, no dullness to percussion  CARD:  RRR, no m/r/g  , no peripheral edema, pulses intact, no cyanosis or clubbing.  GI:   Soft & nt; nml bowel sounds; no organomegaly or masses detected.no guarding or rebound     Musco: Warm bil, no deformities or joint swelling noted.   Neuro: alert, no focal deficits noted.  Oriented to person and place  Skin: Warm, mild reddened area along posterior buttock, mid upper middle, no ulceration noted.            Assessment & Plan:

## 2011-12-07 NOTE — Assessment & Plan Note (Signed)
Controlled on rx   

## 2011-12-07 NOTE — Assessment & Plan Note (Signed)
Improved cont w/ position changes and avoid of prolonged pressure to buttock destin and a/d ointment As needed

## 2011-12-07 NOTE — Assessment & Plan Note (Signed)
Mod dementia Compensated on current regimen Seems improved on exelon  Cont w/ neuro follow up

## 2012-03-12 ENCOUNTER — Encounter: Payer: Self-pay | Admitting: Internal Medicine

## 2012-03-12 ENCOUNTER — Ambulatory Visit (INDEPENDENT_AMBULATORY_CARE_PROVIDER_SITE_OTHER): Payer: Medicare Other | Admitting: Internal Medicine

## 2012-03-12 VITALS — BP 100/64 | HR 81 | Temp 97.2°F | Ht 61.0 in | Wt 147.2 lb

## 2012-03-12 DIAGNOSIS — J310 Chronic rhinitis: Secondary | ICD-10-CM

## 2012-03-12 DIAGNOSIS — I10 Essential (primary) hypertension: Secondary | ICD-10-CM

## 2012-03-12 DIAGNOSIS — K219 Gastro-esophageal reflux disease without esophagitis: Secondary | ICD-10-CM

## 2012-03-12 NOTE — Progress Notes (Signed)
Subjective:    Patient ID: [name], female    DOB: August 10, 1930    MRN: [id number]  Brief patient profile:  66 yowf with minimal smoking hx with morbid obesity and  history of asthmatic bronchitis, hypertension, osteoarthritis and mild dementia.   January 04, 2010 ov c/o decrease in appetite over the past month. She also c/o some occ difficulty with swallowing. Has gerd per GI (Dr Doreatha Martin) denies non adherence with rx as outlined in med calendar and inventoried accurately under present meds. sore on sacrum no better, using donut to sit. Urgency also but no dysuria. --stopped lasix, pepcid added, decreased k-dur.   February 07, 2010 -returns for follow up 4 week follow up - states appetite and swallowing have improved. Last visit Pepcid added. She is going to wound care center in [facility] for sacral decub.    02/02/11 Follow up and med review  Pt returns with husband. We reviewed all her meds and organized her medication into her med calendar with pt education.  [name] takes care of all her meds . He uses a numbering system with the med calendar and pill bottles.  She is doing about the same. Says she is tired today and ready to get back home.  No chest pain or dyspnea.  rec  Stop Darvocet/Migrozone.  Follow med calendar closely and bring to each visit  03/21/11 ov/[name] cc generally weak, poor appeitite.  No n or v or D.  Husband concerned decubitus worse, already treated and released by [facility] care center. >> HH eval for ulcer    05/03/2011 Follow up  She is here today with her husband . She is in good spirits today, much happier today .  p change from prozac to citalopram which has helped her appetite.   Sacral decub is better now. HH came out with dressing changes and would care  No fever or edema  >no changes   08/20/2011 Acute OV  Complains of 1 week of urinary urgency and strong smelling urine.  No fever or back pain .Appetite is good.  She has been doing well for last few  months.  Eating well.  Last UTI >8 months ago rec Levaquin 500mg  daily for 7 days  Drink plenty of water.  Empty bladder completely .   08/29/2011 f/u ov/[name] cc continues mild gen weakness, no cough or sob. Husband doing much better keeping up with meds/ med calendar >>no changes   12/06/2011 Follow up  Returns for 3 month follow up .  Appetite has improved , and weight is closer to her normal since beginning exelon patch started end of 2012 .  Continue to use Destin and A/D ointment to buttock.  We discussed her sedentary lifestyle and need to change positions frequently  rec Continue on same meds.    03/12/2012 f/u ov/[name] cc sore throat p choking on candy x 2 weeks, some better p cough drops temporarily.  No unusual cough, purulent sputum or sinus/hb symptoms on present rx. No sob/ wheezing.  Sleeping ok without nocturnal  or early am exacerbation  of respiratory  c/o's or need for noct saba. Also denies any obvious fluctuation of symptoms with weather or environmental changes or other aggravating or alleviating factors except as outlined above   ROS  The following are not active complaints unless bolded sore throat, dysphagia, dental problems, itching, sneezing,  nasal congestion or excess/ purulent secretions, ear ache,   fever, chills, sweats, unintended wt loss, pleuritic or  exertional cp, hemoptysis,  orthopnea pnd or leg swelling, presyncope, palpitations, heartburn, abdominal pain, anorexia, nausea, vomiting, diarrhea  or change in bowel or urinary habits, change in stools or urine, dysuria,hematuria,  rash, arthralgias, visual complaints, headache, numbness weakness or ataxia or problems with walking or coordination,  change in mood/affect or memory.             Past Medical History:  COUGH (ICD-786.2)  DEPRESSION (ICD-311)  ANXIETY, CHRONIC (ICD-300.00)  COLONIC POLYPS (ICD-211.3)  DIVERTICULITIS, COLON (ICD-562.11)  - colonoscopy 03/21/2004  HYPERLIPIDEMIA, MIXED  (ICD-272.2)  BARRETT'S ESOPHAGUS, HX OF (ICD-V12.79)..............................[facility] (Dr [facility] Martin)  - EGD 03/21/2004  OBESITY (ICD-278.00)  - Target wt = < 158 for BMI < 30  UTI'S, CHRONIC (ICD-599.0)...........................................................Marland KitchenMarland KitchenPeterson  OSTEOARTHRITIS (ICD-715.90)  ASTHMATIC BRONCHITIS, ACUTE (ICD-466.0)  DEMENTIA, MILD (ICD-294.8)  HYPERTENSION (ICD-401.9)  RHINITIS (ICD-472.0)  Sacral Decubitus -wound care > referred to [facility] 03/21/2011    COMPLEX MED REGIMEN  ---.Meds reviewed with pt education and computerized med calendar completed/adjusted-08/19/2008, June 26, 2010 , 02/02/11                                    Objective:   Physical Exam  GEN: A/Ox2 ;  NAD , elderly and frail w/c bound  Wt Readings from Last 3 Encounters:  03/12/12 147 lb 3.2 oz (66.769 kg)  12/06/11 145 lb 9.6 oz (66.044 kg)  08/29/11 147 lb (66.679 kg)     HEENT:  [place]/AT,  EACs-clear, TMs-wnl, NOSE-clear drainage , THROAT-clear, no lesions, no postnasal drip or exudate noted.   NECK:  Supple w/ fair ROM; no JVD; normal carotid impulses w/o bruits; no thyromegaly or nodules palpated; no lymphadenopathy.  RESP  Clear  P & A; w/o, wheezes/ rales/ or rhonchi.no accessory muscle use, no dullness to percussion  CARD:  RRR, no m/r/g  , no peripheral edema, pulses intact, no cyanosis or clubbing.  GI:   Soft & nt; nml bowel sounds; no organomegaly or masses detected.no guarding or rebound     Musco: Warm bil, no deformities or joint swelling noted.   Neuro: alert, no focal deficits noted.  Oriented to person and place  Skin: Warm, mild reddened area along posterior buttock, mid upper middle, no ulceration noted.            Assessment & Plan:

## 2012-03-12 NOTE — Assessment & Plan Note (Signed)
Adequate control on present rx, reviewed  

## 2012-03-12 NOTE — Assessment & Plan Note (Signed)
Explained natural history of any insult to upper airway whether related to uri/ or trauma like a choking on cany > why it's necessary in patients at risk to treat GERD aggressively  at least  short term   to reduce risk of evolving cyclical cough initially  triggered by epithelial injury and a heightened sensitivty to the effects of any upper airway irritants,  most importantly acid - related.  That is, the more sensitive the epithelium damaged for virus, the more the cough, the more the secondary reflux (especially in those prone to reflux) the more the irritation of the sensitive mucosa and so on in a cyclical pattern.  For now add pepcid 20 mg at hs and use sugarless candy, not cough drops, to suppress urge to clear throat.

## 2012-03-12 NOTE — Assessment & Plan Note (Signed)
Adequate control on present rx, reviewed rx, doubt related to sorethroat.    Each maintenance medication was reviewed in detail including most importantly the difference between maintenance and as needed and under what circumstances the prns are to be used. This was done in the context of a medication calendar review which provided the patient with a user-friendly unambiguous mechanism for medication administration and reconciliation and provides an action plan for all active problems. It is critical that this be shown to every doctor  for modification during the office visit if necessary so the patient can use it as a working document.

## 2012-03-12 NOTE — Patient Instructions (Addendum)
See calendar for specific medication instructions and bring it back for each and every office visit for every healthcare provider you see.  Without it,  you may not receive the best quality medical care that we feel you deserve.  You will note that the calendar groups together  your maintenance  medications that are timed at particular times of the day.  Think of this as your checklist for what your doctor has instructed you to do until your next evaluation to see what benefit  there is  to staying on a consistent group of medications intended to keep you well.  The other group at the bottom is entirely up to you to use as you see fit  for specific symptoms that may arise between visits that require you to treat them on an as needed basis.  Think of this as your action plan or "what if" list.   Separating the top medications from the bottom group is fundamental to providing you adequate care going forward.    If throat irritation, use sugarless jolly ranchers or stovers candy and avoid medicated lozenges and pepcid 20 mg one at bedtime until completely better  Please schedule a follow up visit in 3 months but call sooner if needed to see Tammy NP

## 2012-06-25 ENCOUNTER — Other Ambulatory Visit: Payer: Self-pay | Admitting: Adult Health

## 2012-06-25 DIAGNOSIS — Z1231 Encounter for screening mammogram for malignant neoplasm of breast: Secondary | ICD-10-CM

## 2012-07-31 ENCOUNTER — Ambulatory Visit
Admission: RE | Admit: 2012-07-31 | Discharge: 2012-07-31 | Disposition: A | Discharge status: Home / Self Care | Payer: Medicare Other | Source: Ambulatory Visit | Attending: Adult Health | Admitting: Adult Health

## 2012-07-31 DIAGNOSIS — Z1231 Encounter for screening mammogram for malignant neoplasm of breast: Secondary | ICD-10-CM

## 2012-08-04 ENCOUNTER — Other Ambulatory Visit (INDEPENDENT_AMBULATORY_CARE_PROVIDER_SITE_OTHER): Payer: Medicare Other

## 2012-08-04 ENCOUNTER — Encounter: Payer: Self-pay | Admitting: Internal Medicine

## 2012-08-04 ENCOUNTER — Telehealth: Payer: Self-pay | Admitting: Internal Medicine

## 2012-08-04 ENCOUNTER — Ambulatory Visit (INDEPENDENT_AMBULATORY_CARE_PROVIDER_SITE_OTHER): Payer: Medicare Other | Admitting: Internal Medicine

## 2012-08-04 ENCOUNTER — Other Ambulatory Visit: Payer: Medicare Other

## 2012-08-04 VITALS — BP 106/76 | HR 87 | Temp 97.0°F | Ht 63.0 in | Wt 151.0 lb

## 2012-08-04 DIAGNOSIS — N390 Urinary tract infection, site not specified: Secondary | ICD-10-CM

## 2012-08-04 LAB — URINALYSIS, ROUTINE W REFLEX MICROSCOPIC
Bilirubin Urine: NEGATIVE
Ketones, ur: NEGATIVE
Nitrite: POSITIVE
Urobilinogen, UA: 0.2 (ref 0.0–1.0)
pH: 5.5 (ref 5.0–8.0)

## 2012-08-04 MED ORDER — CIPROFLOXACIN HCL 500 MG PO TABS: ORAL_TABLET | ORAL | Status: DC

## 2012-08-04 NOTE — Progress Notes (Signed)
Subjective:    Patient ID: CARAL WHAN, female    DOB: 05/07/1931    MRN: [id number]  Brief patient profile:  28 yowf with minimal smoking hx with morbid obesity and  history of asthmatic bronchitis, hypertension, osteoarthritis and mild dementia.   January 04, 2010 ov c/o decrease in appetite over the past month. She also c/o some occ difficulty with swallowing. Has gerd per GI (Dr Doreatha Martin) denies non adherence with rx as outlined in med calendar and inventoried accurately under present meds. sore on sacrum no better, using donut to sit. Urgency also but no dysuria. --stopped lasix, pepcid added, decreased k-dur.   February 07, 2010 -returns for follow up 4 week follow up - states appetite and swallowing have improved. Last visit Pepcid added. She is going to wound care center in [facility] for sacral decub.    02/02/11 Follow up and med review  Pt returns with husband. We reviewed all her meds and organized her medication into her med calendar with pt education.  Mr. Annalee Genta takes care of all her meds . He uses a numbering system with the med calendar and pill bottles.  She is doing about the same. Says she is tired today and ready to get back home.  No chest pain or dyspnea.  rec  Stop Darvocet/Migrozone.  Follow med calendar closely and bring to each visit  03/21/11 ov/Tarvares Lant cc generally weak, poor appeitite.  No n or v or D.  Husband concerned decubitus worse, already treated and released by [facility] wound care center. >> HH eval for ulcer    05/03/2011 Follow up  She is here today with her husband . She is in good spirits today, much happier today .  p change from prozac to citalopram which has helped her appetite.   Sacral decub is better now. HH came out with dressing changes and would care  No fever or edema  >no changes   08/20/2011 Acute OV  Complains of 1 week of urinary urgency and strong smelling urine.  No fever or back pain .Appetite is good.  She has been doing well for last few  months.  Eating well.  Last UTI >8 months ago rec Levaquin 500mg  daily for 7 days  Drink plenty of water.  Empty bladder completely .   08/29/2011 f/u ov/Jaime Grizzell cc continues mild gen weakness, no cough or sob. Husband doing much better keeping up with meds/ med calendar >>no changes   12/06/2011 Follow up  Returns for 3 month follow up .  Appetite has improved , and weight is closer to her normal since beginning exelon patch started end of 2012 .  Continue to use Destin and A/D ointment to buttock.  We discussed her sedentary lifestyle and need to change positions frequently  rec Continue on same meds.    03/12/2012 f/u ov/Dorrian Doggett cc sore throat p choking on candy x 2 weeks, some better p cough drops temporarily.  No unusual cough, purulent sputum or sinus/hb symptoms on present rx. No sob/ wheezing. rec No change rx, follow med calendar and bring it to each ov  08/06/2012 f/u ov/Meriam Chojnowski cc  Pt c/o low back pain, and dark and foul smelling urine x 3 days  No abd pain n or v or rigors   Sleeping ok without nocturnal  or early am exacerbation  of respiratory  c/o's or need for noct saba. Also denies any obvious fluctuation of symptoms with weather or environmental changes or other aggravating or alleviating  factors except as outlined above   ROS  The following are not active complaints unless bolded sore throat, dysphagia, dental problems, itching, sneezing,  nasal congestion or excess/ purulent secretions, ear ache,   fever, chills, sweats, unintended wt loss, pleuritic or exertional cp, hemoptysis,  orthopnea pnd or leg swelling, presyncope, palpitations, heartburn, abdominal pain, anorexia, nausea, vomiting, diarrhea  or change in bowel or urinary habits, change in stools or urine, dysuria,hematuria,  rash, arthralgias, visual complaints, headache, numbness weakness or ataxia or problems with walking or coordination,  change in mood/affect or memory.             Past Medical History:    COUGH (ICD-786.2)  DEPRESSION (ICD-311)  ANXIETY, CHRONIC (ICD-300.00)  COLONIC POLYPS (ICD-211.3)  DIVERTICULITIS, COLON (ICD-562.11)  - colonoscopy 03/21/2004  HYPERLIPIDEMIA, MIXED (ICD-272.2)  BARRETT'S ESOPHAGUS, HX OF (ICD-V12.79)..............................[facility]  - EGD 03/21/2004  OBESITY (ICD-278.00)  - Target wt = < 158 for BMI < 30  UTI'S, CHRONIC (ICD-599.0)...........................................................Marland KitchenMarland KitchenPeterson  OSTEOARTHRITIS (ICD-715.90)  ASTHMATIC BRONCHITIS, ACUTE (ICD-466.0)  DEMENTIA, MILD (ICD-294.8)  HYPERTENSION (ICD-401.9)  RHINITIS (ICD-472.0)  Sacral Decubitus -wound care > referred to [facility] 03/21/2011    COMPLEX MED REGIMEN  ---.Meds reviewed with pt education and computerized med calendar completed/adjusted-08/19/2008, June 26, 2010 , 02/02/11                                    Objective:   Physical Exam  GEN: A/Ox2 ;  NAD , elderly and frail w/c bound Wt 151 08/04/12 Wt Readings from Last 3 Encounters:  03/12/12 147 lb 3.2 oz (66.769 kg)  12/06/11 145 lb 9.6 oz (66.044 kg)  08/29/11 147 lb (66.679 kg)     HEENT:  [place]/AT,  EACs-clear, TMs-wnl, NOSE-clear drainage , THROAT-clear, no lesions, no postnasal drip or exudate noted.   NECK:  Supple w/ fair ROM; no JVD; normal carotid impulses w/o bruits; no thyromegaly or nodules palpated; no lymphadenopathy.  RESP  Clear  P & A; w/o, wheezes/ rales/ or rhonchi.no accessory muscle use, no dullness to percussion  CARD:  RRR, no m/r/g  , no peripheral edema, pulses intact, no cyanosis or clubbing.  GI:   Soft & nt; nml bowel sounds; no organomegaly or masses detected.no guarding or rebound No CVAT    Musco: Warm bil, no deformities or joint swelling noted.   Neuro: alert, no focal deficits noted.  Oriented to person and place  Skin: Warm, mild reddened area along posterior buttock, mid upper middle, no ulceration noted.        U/a pos wbc  nitrites, leuk    Assessment & Plan:

## 2012-08-04 NOTE — Telephone Encounter (Signed)
Called, spoke with pt's spouse.  States pt had had an odor to urine for the past few days and back pain.  Believes she has a bladder infection.  Denies frequency urinating, dsyuria, Or f/c/s.  Was last seen in Aug 2013 and asked to f/u in 3 months.  OV scheduled with Dr. Sherene Sires for today at 11:45 am -- spouse aware.

## 2012-08-04 NOTE — Patient Instructions (Addendum)
Cipro 500 one half daily x 5 days  Please remember to go to the lab  department downstairs for your tests - we will call you with the results when they are available.   See Tammy NP   3 months  Sooner if needed with med calendar in hand

## 2012-08-05 NOTE — Progress Notes (Signed)
Quick Note:  Spoke with pt and notified of results per Dr. Wert. Pt verbalized understanding and denied any questions.  ______ 

## 2012-08-06 NOTE — Assessment & Plan Note (Signed)
U/a suggests recurrent uti's apparently only occurring once or twice a year now  rec cipro 500 one half bid x 5 days  I had an extended discussion with the patient and husband today lasting 15 to 20 minutes of a 25 minute visit on the following issues:   Each maintenance medication was reviewed in detail including most importantly the difference between maintenance and as needed and under what circumstances the prns are to be used.  Please see instructions for details which were reviewed in writing and the patient given a copy.   Urged husband to use the calendar provided previously and bring it with each ov to each provider.

## 2012-08-08 ENCOUNTER — Encounter: Payer: Self-pay | Admitting: Internal Medicine

## 2012-08-08 LAB — CULTURE, URINE COMPREHENSIVE: Colony Count: >=100000

## 2012-08-11 ENCOUNTER — Other Ambulatory Visit: Payer: Self-pay | Admitting: Internal Medicine

## 2012-08-11 MED ORDER — CEFUROXIME AXETIL 250 MG PO TABS: 250.0000 mg | ORAL_TABLET | Freq: Two times a day (BID) | ORAL | Status: AC

## 2012-08-11 NOTE — Progress Notes (Signed)
Quick Note:  Spoke with pt's spouse and notified of results per Dr. Sherene Sires. He verbalized understanding and denied any questions. Pt still with foul smelling dark urine, so I have sent rx for the ceftin  If still having issues p that abx she is to let us know   ______

## 2012-09-02 ENCOUNTER — Telehealth: Payer: Self-pay | Admitting: Internal Medicine

## 2012-09-02 MED ORDER — PANTOPRAZOLE SODIUM 40 MG PO TBEC: 40.0000 mg | DELAYED_RELEASE_TABLET | Freq: Every day | ORAL | Status: DC

## 2012-09-02 MED ORDER — OXYBUTYNIN CHLORIDE 5 MG PO TABS: 5.0000 mg | ORAL_TABLET | Freq: Three times a day (TID) | ORAL | Status: AC

## 2012-09-02 MED ORDER — POTASSIUM CHLORIDE CRYS ER 20 MEQ PO TBCR: 20.0000 meq | EXTENDED_RELEASE_TABLET | ORAL | Status: DC

## 2012-09-02 MED ORDER — ESTROPIPATE 1.5 MG PO TABS: 1.5000 mg | ORAL_TABLET | ORAL | Status: DC

## 2012-09-02 MED ORDER — CLONIDINE HCL 0.1 MG PO TABS: 0.1000 mg | ORAL_TABLET | Freq: Every day | ORAL | Status: DC

## 2012-09-02 MED ORDER — CITALOPRAM HYDROBROMIDE 20 MG PO TABS: 20.0000 mg | ORAL_TABLET | Freq: Every day | ORAL | Status: DC

## 2012-09-02 NOTE — Telephone Encounter (Signed)
Spoke with pt I advised that we have already sent all her meds in to [facility] on 2/4 except for the pantoprazole I sent to pantoprazole and she is aware I tried explaining that she is we do not fill the namenda for her, and she should check with her neurologist She did not seem to understand, so I asked her to her spouse Jesusita Oka, call me back  Will await his call

## 2012-09-02 NOTE — Telephone Encounter (Signed)
Spouse aware rx's have been sent. Nothing further was needed

## 2012-09-11 ENCOUNTER — Encounter: Payer: Self-pay | Admitting: Pulmonary Disease

## 2012-09-11 ENCOUNTER — Ambulatory Visit (INDEPENDENT_AMBULATORY_CARE_PROVIDER_SITE_OTHER): Payer: Medicare Other | Admitting: Pulmonary Disease

## 2012-09-11 VITALS — BP 108/78 | HR 80 | Temp 97.5°F | Ht 62.0 in | Wt 154.0 lb

## 2012-09-11 DIAGNOSIS — J45909 Unspecified asthma, uncomplicated: Secondary | ICD-10-CM

## 2012-09-11 DIAGNOSIS — J45901 Unspecified asthma with (acute) exacerbation: Secondary | ICD-10-CM

## 2012-09-11 MED ORDER — AZITHROMYCIN 250 MG PO TABS: ORAL_TABLET | ORAL | Status: AC

## 2012-09-11 MED ORDER — ALBUTEROL SULFATE HFA 108 (90 BASE) MCG/ACT IN AERS: 2.0000 | INHALATION_SPRAY | Freq: Four times a day (QID) | RESPIRATORY_TRACT | Status: DC | PRN

## 2012-09-11 NOTE — Patient Instructions (Addendum)
Zithromax 250 mg pill >> 2 pills on day 1, then 1 pill daily for 4 days Nasonex two sprays each nostril daily for 1 week, then as needed Proair two puffs up to four times per day as needed for cough, wheeze, or chest congestion Call if not feeling better Follow up with Dr. Sherene Sires or Tammy Parrett in 2 months

## 2012-09-11 NOTE — Assessment & Plan Note (Signed)
Will give course of Zpak.  She can use nasonex for next one week for her sinuses, then as needed.  Her son has an extra, unopened proair inhaler that she can use as needed.

## 2012-09-11 NOTE — Progress Notes (Signed)
Chief Complaint  Patient presents with  . Follow-up    pt states has a sore throat,productive cough,chest tightness,sinus drainage x 2 days.     History of Present Illness: [name] is a 79 y.o. female with morbid obesity and history of asthmatic bronchitis, hypertension, osteoarthritis and mild dementia.   She is followed by Dr. Sherene Sires for primary care.  She has sinus congestion and pressure, post-nasal drip, cough with yellow sputum, wheezing, and chest congestion for past two days.  She is not sure if she has fever.  She denies chest or abdominal pain.  [name]  has a past medical history of Cough; Depressive disorder, not elsewhere classified; Anxiety state, unspecified; Benign neoplasm of colon; Diverticulitis of colon (without mention of hemorrhage); Mixed hyperlipidemia; Obesity, unspecified; Personal history of other diseases of digestive system; Urinary tract infection, site not specified; Osteoarthrosis, unspecified whether generalized or localized, unspecified site; Acute bronchitis; Other persistent mental disorders due to conditions classified elsewhere; Unspecified essential hypertension; and Chronic rhinitis.  [name]  has past surgical history that includes Cholecystectomy; Total hip arthroplasty; Vesicovaginal fistula closure w/ TAH; and Rotator cuff repair.  Prior to Admission medications   Medication Sig Start Date End Date Taking? Authorizing Provider  ALPRAZolam (XANAX) 0.25 MG tablet Take 1 tablet (0.25 mg total) by mouth every 8 (eight) hours as needed. 01/19/11  Yes Nyoka Cowden, MD  aspirin 325 MG EC tablet Take 325 mg by mouth daily.     Yes Historical Provider, MD  citalopram (CELEXA) 20 MG tablet Take 1 tablet (20 mg total) by mouth daily. 09/02/12 09/02/13 Yes Nyoka Cowden, MD  cloNIDine (CATAPRES) 0.1 MG tablet Take 1 tablet (0.1 mg total) by mouth daily. 09/02/12  Yes Nyoka Cowden, MD  dexbrompheniramine-pseudoephedrine (DRIXORAL) 6-120  MG per tablet Take 1 tablet by mouth daily as needed (for drippy nose). 02/08/11  Yes Tammy S Parrett, NP  estropipate (OGEN) 1.5 MG tablet Take 1 tablet (1.5 mg total) by mouth every morning. 09/02/12 09/02/13 Yes Nyoka Cowden, MD  EXELON 4.6 MG/24HR Apply 1 patch daily 08/22/11  Yes Historical Provider, MD  famotidine (PEPCID) 20 MG tablet Take 20 mg by mouth at bedtime.     Yes Historical Provider, MD  HYDROcodone-acetaminophen (VICODIN) 5-500 MG per tablet Take 1 tablet by mouth every 6 (six) hours as needed (for severe pain). 02/08/11  Yes Tammy S Parrett, NP  hyoscyamine (LEVSIN, ANASPAZ) 0.125 MG tablet 1-2 every 6 hours as needed for abdominal cramps 02/08/11  Yes Tammy S Parrett, NP  ibuprofen (ADVIL,MOTRIN) 200 MG tablet Take 200 mg by mouth as needed.    Yes Historical Provider, MD  memantine (NAMENDA) 10 MG tablet Take 10 mg by mouth 2 (two) times daily.     Yes Historical Provider, MD  mometasone (NASONEX) 50 MCG/ACT nasal spray as needed. 08/20/11  Yes Tammy S Parrett, NP  oxybutynin (DITROPAN) 5 MG tablet Take 1 tablet (5 mg total) by mouth 3 (three) times daily. 09/02/12 09/02/13 Yes Nyoka Cowden, MD  oxymetazoline (AFRIN) 0.05 % nasal spray Twice daily for 5 days as needed  before Nasonex 02/08/11  Yes Tammy S Parrett, NP  pantoprazole (PROTONIX) 40 MG tablet Take 1 tablet (40 mg total) by mouth daily before breakfast. 09/02/12  Yes Nyoka Cowden, MD  potassium chloride SA (K-DUR,KLOR-CON) 20 MEQ tablet Take 1 tablet (20 mEq total) by mouth every morning. 09/02/12 09/02/13 Yes Nyoka Cowden, MD  promethazine (  PHENERGAN) 25 MG tablet Take 25 mg by mouth every 6 (six) hours as needed.     Yes Historical Provider, MD  triamterene-hydrochlorothiazide (MAXZIDE) 75-50 MG per tablet Take 1 tablet by mouth daily as needed.     Yes Historical Provider, MD  valACYclovir (VALTREX) 1000 MG tablet Take 1 tablet (1,000 mg total) by mouth daily as needed. 02/08/11  Yes Julio Sicks, NP  ciprofloxacin  (CIPRO) 500 MG tablet One half twice daily 08/04/12   Nyoka Cowden, MD    Allergies  Allergen Reactions  . Penicillins     REACTION: adolesent     Physical Exam:  General - No distress ENT - Mild b/l maxillary sinus tenderness, no oral exudate, no LAN Cardiac - s1s2 regular, no murmur Chest - scattered rhonchi, no wheeze/rales/dullness Back - No focal tenderness Abd - Soft, non-tender Ext - No edema Neuro - Normal strength Skin - No rashes Psych - normal mood, and behavior   Assessment/Plan:  Coralyn Helling, MD [facility] Pager:  [phone] 09/11/2012, 2:40 PM

## 2012-11-03 ENCOUNTER — Ambulatory Visit (INDEPENDENT_AMBULATORY_CARE_PROVIDER_SITE_OTHER): Payer: Medicare Other | Admitting: Adult Health

## 2012-11-03 ENCOUNTER — Other Ambulatory Visit (INDEPENDENT_AMBULATORY_CARE_PROVIDER_SITE_OTHER): Payer: Medicare Other

## 2012-11-03 ENCOUNTER — Encounter: Payer: Self-pay | Admitting: Adult Health

## 2012-11-03 ENCOUNTER — Encounter: Payer: Self-pay | Admitting: *Deleted

## 2012-11-03 VITALS — BP 106/70 | HR 83 | Temp 98.6°F | Ht 64.0 in | Wt 155.4 lb

## 2012-11-03 DIAGNOSIS — Z0000 Encounter for general adult medical examination without abnormal findings: Secondary | ICD-10-CM

## 2012-11-03 DIAGNOSIS — K219 Gastro-esophageal reflux disease without esophagitis: Secondary | ICD-10-CM

## 2012-11-03 DIAGNOSIS — I10 Essential (primary) hypertension: Secondary | ICD-10-CM

## 2012-11-03 DIAGNOSIS — F068 Other specified mental disorders due to known physiological condition: Secondary | ICD-10-CM

## 2012-11-03 LAB — HEPATIC FUNCTION PANEL
Albumin: 3.4 g/dL — ABNORMAL LOW (ref 3.5–5.2)
Alkaline Phosphatase: 60 U/L (ref 39–117)
Bilirubin, Direct: 0.1 mg/dL (ref 0.0–0.3)
Total Protein: 7.4 g/dL (ref 6.0–8.3)

## 2012-11-03 LAB — CBC WITH DIFFERENTIAL/PLATELET
Basophils Absolute: 0 10*3/uL (ref 0.0–0.1)
Eosinophils Absolute: 0.7 10*3/uL (ref 0.0–0.7)
Hemoglobin: 14.4 g/dL (ref 12.0–15.0)
Lymphocytes Relative: 24 % (ref 12.0–46.0)
MCHC: 33.6 g/dL (ref 30.0–36.0)
Monocytes Relative: 6 % (ref 3.0–12.0)
Neutro Abs: 5.4 10*3/uL (ref 1.4–7.7)
Platelets: 164 10*3/uL (ref 150.0–400.0)
RDW: 15.3 % — ABNORMAL HIGH (ref 11.5–14.6)

## 2012-11-03 LAB — BASIC METABOLIC PANEL
BUN: 15 mg/dL (ref 6–23)
CO2: 30 mEq/L (ref 19–32)
Calcium: 8.6 mg/dL (ref 8.4–10.5)
Creatinine, Ser: 1 mg/dL (ref 0.4–1.2)
GFR: 56.03 mL/min — ABNORMAL LOW (ref >60.00)
Glucose, Bld: 120 mg/dL — ABNORMAL HIGH (ref 70–99)

## 2012-11-03 LAB — TSH: TSH: 1.52 u[IU]/mL (ref 0.35–5.50)

## 2012-11-03 NOTE — Assessment & Plan Note (Signed)
Declines future mammograms or colonoscopy screenings

## 2012-11-03 NOTE — Assessment & Plan Note (Signed)
Controlled on rx  Labs pending

## 2012-11-03 NOTE — Patient Instructions (Addendum)
Continue on same meds.  Follow up Dr. Sherene Sires  In 4  months  Try to keep mobile , change positions often during daytime.  Avoid sitting or lying on back for prolonged periods- shift weight to avoid pressure on buttock

## 2012-11-03 NOTE — Assessment & Plan Note (Signed)
Stable on regimen Cont follow up with neuro

## 2012-11-03 NOTE — Progress Notes (Signed)
Subjective:    Patient ID: [name], female    DOB: 1930/11/05    MRN: [id number]  Brief patient profile:  54- yowf with minimal smoking hx with morbid obesity and  history of asthmatic bronchitis, hypertension, osteoarthritis and mild dementia.   January 04, 2010 ov c/o decrease in appetite over the past month. She also c/o some occ difficulty with swallowing. Has gerd per GI (Dr Doreatha Martin) denies non adherence with rx as outlined in med calendar and inventoried accurately under present meds. sore on sacrum no better, using donut to sit. Urgency also but no dysuria. --stopped lasix, pepcid added, decreased k-dur.   February 07, 2010 -returns for follow up 4 week follow up - states appetite and swallowing have improved. Last visit Pepcid added. She is going to wound care center in [facility] for sacral decub.    02/02/11 Follow up and med review  Pt returns with husband. We reviewed all her meds and organized her medication into her med calendar with pt education.  Mr. Annalee Genta takes care of all her meds . He uses a numbering system with the med calendar and pill bottles.  She is doing about the same. Says she is tired today and ready to get back home.  No chest pain or dyspnea.  rec  Stop Darvocet/Migrozone.  Follow med calendar closely and bring to each visit  03/21/11 ov/Wert cc generally weak, poor appeitite.  No n or v or D.  Husband concerned decubitus worse, already treated and released by [facility] care center. >> HH eval for ulcer    05/03/2011 Follow up  She is here today with her husband . She is in good spirits today, much happier today .  p change from prozac to citalopram which has helped her appetite.   Sacral decub is better now. HH came out with dressing changes and would care  No fever or edema  >no changes   08/20/2011 Acute OV  Complains of 1 week of urinary urgency and strong smelling urine.  No fever or back pain .Appetite is good.  She has been doing well for last few  months.  Eating well.  Last UTI >8 months ago rec Levaquin 500mg  daily for 7 days  Drink plenty of water.  Empty bladder completely .   08/29/2011 f/u ov/Wert cc continues mild gen weakness, no cough or sob. Husband doing much better keeping up with meds/ med calendar >>no changes   12/06/2011 Follow up  Returns for 3 month follow up .  Appetite has improved , and weight is closer to her normal since beginning exelon patch started end of 2012 .  Continue to use Destin and A/D ointment to buttock.  We discussed her sedentary lifestyle and need to change positions frequently  rec Continue on same meds.    03/12/2012 f/u ov/Wert cc sore throat p choking on candy x 2 weeks, some better p cough drops temporarily.  No unusual cough, purulent sputum or sinus/hb symptoms on present rx. No sob/ wheezing. rec No change rx, follow med calendar and bring it to each ov  08/06/2012 f/u ov/Wert cc  Pt c/o low back pain, and dark and foul smelling urine x 3 days  No abd pain n or v or rigors >cipro x 5 d   11/03/2012 Follow up  Returns for routine follow up  She is accompanied by her husband.  At her baseline, with no chest pain, increased dyspnea or edema.  Memory loss has  stabilized on Exelon and Namenda, continues to see Dr[facility] Zipporah Plants at [facility].  Had URI 2/27, tx w/ zpack with resolution We discussed upcoming screenings such as mammogram and colon -she /husband decline at this time-do not want to do these in future .    ROS   Constitutional:   No  weight loss, night sweats,  Fevers, chills,  +fatigue, or  lassitude.  HEENT:   No headaches,  Difficulty swallowing,  Tooth/dental problems, or  Sore throat,                No sneezing, itching, ear ache, nasal congestion, post nasal drip,   CV:  No chest pain,  Orthopnea, PND, swelling in lower extremities, anasarca, dizziness, palpitations, syncope.   GI  No heartburn, indigestion, abdominal pain, nausea, vomiting, diarrhea, change in bowel  habits, loss of appetite, bloody stools.   Resp:    No coughing up of blood.  No change in color of mucus.  No wheezing.  No chest wall deformity  Skin: no rash or lesions.  GU: no dysuria, change in color of urine, no urgency or frequency.  No flank pain, no hematuria    Psych:  No change in mood or affect. No depression or anxiety.  ++memory loss.          Past Medical History:  COUGH (ICD-786.2)  DEPRESSION (ICD-311)  ANXIETY, CHRONIC (ICD-300.00)  COLONIC POLYPS (ICD-211.3)  DIVERTICULITIS, COLON (ICD-562.11)  - colonoscopy 03/21/2004  HYPERLIPIDEMIA, MIXED (ICD-272.2)  BARRETT'S ESOPHAGUS, HX OF (ICD-V12.79)..............................[facility] (Dr [facility])  - EGD 03/21/2004  OBESITY (ICD-278.00)  - Target wt = < 158 for BMI < 30  UTI'S, CHRONIC (ICD-599.0)...........................................................Marland KitchenMarland KitchenPeterson  OSTEOARTHRITIS (ICD-715.90)  ASTHMATIC BRONCHITIS, ACUTE (ICD-466.0)  DEMENTIA, MILD (ICD-294.8) >followed by [facility]  HYPERTENSION (ICD-401.9)  RHINITIS (ICD-472.0)  Sacral Decubitus -wound care > referred to [facility] 03/21/2011    COMPLEX MED REGIMEN  ---.Meds reviewed with pt education and computerized med calendar completed/adjusted-08/19/2008, June 26, 2010 , 02/02/11                                    Objective:   Physical Exam  GEN: A/Ox2 ;  NAD , elderly and frail w/c bound Wt 151 08/04/12 >155 11/03/2012     HEENT:  [place]/AT,  EACs-clear, TMs-wnl, NOSE-clear drainage , THROAT-clear, no lesions, no postnasal drip or exudate noted.   NECK:  Supple w/ fair ROM; no JVD; normal carotid impulses w/o bruits; no thyromegaly or nodules palpated; no lymphadenopathy.  RESP  Clear  P & A; w/o, wheezes/ rales/ or rhonchi.no accessory muscle use, no dullness to percussion  CARD:  RRR, no m/r/g  , no peripheral edema, pulses intact, no cyanosis or clubbing.  GI:   Soft & nt; nml bowel sounds; no organomegaly or masses  detected.no guarding or rebound     Musco: Warm bil, no deformities or joint swelling noted.   Neuro: alert, no focal deficits noted.  Oriented to person and place  Skin: Warm, no rash .            Assessment & Plan:

## 2012-11-03 NOTE — Assessment & Plan Note (Signed)
Controlled on rx  Low salt diet  Labs pending

## 2012-11-06 NOTE — Progress Notes (Signed)
Quick Note:  Called spoke with patient's spouse. Advised of lab results / recs as stated by TP. Spouse verbalized understanding and denied any questions. ______ 

## 2012-11-10 ENCOUNTER — Telehealth: Payer: Self-pay | Admitting: Internal Medicine

## 2012-11-10 DIAGNOSIS — N390 Urinary tract infection, site not specified: Secondary | ICD-10-CM

## 2012-11-10 NOTE — Addendum Note (Signed)
Addended by: Boone Master E on: 11/10/2012 02:17 PM   Modules accepted: Orders, Medications

## 2012-11-10 NOTE — Telephone Encounter (Signed)
Spoke with spouse He states that the pt has had dark, foul smelling urine x 2 days No urgency, frequency, pain, fever Would like something called in since she was just seen on 4/21 Please advise thanks! Allergies  Allergen Reactions  . Penicillins     REACTION: adolesent

## 2012-11-10 NOTE — Telephone Encounter (Signed)
rcent urine cx showed resistant e coli   See if her husband can bring a sample of urine to lab  Push fluids  Please contact office for sooner follow up if symptoms do not improve or worsen or seek emergency care

## 2012-11-10 NOTE — Telephone Encounter (Signed)
UA and urine culture ordered Dan to bring pt in tomorrow to leave sample

## 2012-11-11 ENCOUNTER — Other Ambulatory Visit (INDEPENDENT_AMBULATORY_CARE_PROVIDER_SITE_OTHER): Payer: Medicare Other

## 2012-11-11 ENCOUNTER — Telehealth: Payer: Self-pay | Admitting: Adult Health

## 2012-11-11 ENCOUNTER — Telehealth: Payer: Self-pay | Admitting: Internal Medicine

## 2012-11-11 DIAGNOSIS — N390 Urinary tract infection, site not specified: Secondary | ICD-10-CM

## 2012-11-11 LAB — URINALYSIS, ROUTINE W REFLEX MICROSCOPIC
Bilirubin Urine: NEGATIVE
Ketones, ur: NEGATIVE
Nitrite: POSITIVE
Specific Gravity, Urine: 1.025 (ref 1.000–1.030)
Urobilinogen, UA: 0.2 (ref 0.0–1.0)

## 2012-11-11 MED ORDER — CITALOPRAM HYDROBROMIDE 20 MG PO TABS: 20.0000 mg | ORAL_TABLET | Freq: Every day | ORAL | Status: DC

## 2012-11-11 MED ORDER — CEFUROXIME AXETIL 250 MG PO TABS: 250.0000 mg | ORAL_TABLET | Freq: Two times a day (BID) | ORAL | Status: AC

## 2012-11-11 MED ORDER — PANTOPRAZOLE SODIUM 40 MG PO TBEC: 40.0000 mg | DELAYED_RELEASE_TABLET | Freq: Every day | ORAL | Status: DC

## 2012-11-11 NOTE — Telephone Encounter (Signed)
Spoke with the pt and notified that meds were sent to pharm She verbalized understanding and states nothing further needed

## 2012-11-11 NOTE — Telephone Encounter (Signed)
Spoke with husband , rx for abx sent to pharm for UTI  Will follow cx resutls as returns  Please contact office for sooner follow up if symptoms do not improve or worsen or seek emergency care

## 2012-11-11 NOTE — Telephone Encounter (Signed)
Message copied by Julio Sicks on Tue Nov 11, 2012  9:18 PM ------      Message from: Julio Sicks      Created: Tue Nov 11, 2012  4:45 PM       Urine shows UTI       Begin Ceftin 250mg  Twice daily  #14 for UTI       Will await cx as previous urine was resistant to mulitiple drugs       Please contact office for sooner follow up if symptoms do not improve or worsen or seek emergency care       Push fluids ------

## 2012-11-14 LAB — CULTURE, URINE COMPREHENSIVE: Colony Count: >=100000

## 2012-11-14 NOTE — Progress Notes (Signed)
Quick Note:  Called, spoke with pt's husband. Informed him of below results and recs per TP. He verbalized understanding, is aware to have pt cont with abx, and to call office if symptoms do not improve or worsen and seek emergency care if needed. He verbalized understanding and voiced no further questions or concerns at this time. ______

## 2012-12-22 ENCOUNTER — Ambulatory Visit (INDEPENDENT_AMBULATORY_CARE_PROVIDER_SITE_OTHER): Payer: Medicare Other | Admitting: Internal Medicine

## 2012-12-22 ENCOUNTER — Telehealth: Payer: Self-pay | Admitting: Internal Medicine

## 2012-12-22 ENCOUNTER — Encounter: Payer: Self-pay | Admitting: Internal Medicine

## 2012-12-22 ENCOUNTER — Ambulatory Visit: Payer: Medicare Other | Admitting: Internal Medicine

## 2012-12-22 VITALS — BP 110/80 | HR 137 | Temp 97.5°F | Ht 61.0 in | Wt 151.0 lb

## 2012-12-22 DIAGNOSIS — N390 Urinary tract infection, site not specified: Secondary | ICD-10-CM

## 2012-12-22 MED ORDER — NYSTATIN 100000 UNIT/GM EX POWD: Freq: Four times a day (QID) | CUTANEOUS | Status: DC

## 2012-12-22 MED ORDER — CEFUROXIME AXETIL 250 MG PO TABS: 250.0000 mg | ORAL_TABLET | Freq: Two times a day (BID) | ORAL | Status: DC

## 2012-12-22 NOTE — Patient Instructions (Addendum)
ceftin 250 mg twice daily x 10 days and f/u Dr Margarita Grizzle if not better.  Nystatin powder 4 x daily to rash in skin fold and call for dermatology appt if needed   See calendar for specific medication instructions and bring it back for each and every office visit for every healthcare provider you see.  Without it,  you may not receive the best quality medical care that we feel you deserve.  You will note that the calendar groups together  your maintenance  medications that are timed at particular times of the day.  Think of this as your checklist for what your doctor has instructed you to do until your next evaluation to see what benefit  there is  to staying on a consistent group of medications intended to keep you well.  The other group at the bottom is entirely up to you to use as you see fit  for specific symptoms that may arise between visits that require you to treat them on an as needed basis.  Think of this as your action plan or "what if" list.   Separating the top medications from the bottom group is fundamental to providing you adequate care going forward.

## 2012-12-22 NOTE — Progress Notes (Signed)
Subjective:    Patient ID: [name], female    DOB: 08-May-1931    MRN: [id number]  Brief patient profile:  63- yowf with minimal smoking hx with morbid obesity and  history of asthmatic bronchitis, hypertension, osteoarthritis and mild dementia.   January 04, 2010 ov c/o decrease in appetite over the past month. She also c/o some occ difficulty with swallowing. Has gerd per GI (Dr Doreatha Martin) denies non adherence with rx as outlined in med calendar and inventoried accurately under present meds. sore on sacrum no better, using donut to sit. Urgency also but no dysuria. --stopped lasix, pepcid added, decreased k-dur.   February 07, 2010 -returns for follow up 4 week follow up - states appetite and swallowing have improved. Last visit Pepcid added. She is going to wound care center in [facility] for sacral decub.    02/02/11 Follow up and med review  Pt returns with husband. We reviewed all her meds and organized her medication into her med calendar with pt education.  Mr. Annalee Genta takes care of all her meds . He uses a numbering system with the med calendar and pill bottles.  She is doing about the same. Says she is tired today and ready to get back home.  No chest pain or dyspnea.  rec  Stop Darvocet/Migrozone.  Follow med calendar closely and bring to each visit  03/21/11 ov/Brycelyn Gambino cc generally weak, poor appeitite.  No n or v or D.  Husband concerned decubitus worse, already treated and released by [facility] care center. >> HH eval for ulcer    05/03/2011 Follow up  She is here today with her husband . She is in good spirits today, much happier today .  p change from prozac to citalopram which has helped her appetite.   Sacral decub is better now. HH came out with dressing changes and would care  No fever or edema  >no changes   08/20/2011 Acute OV  Complains of 1 week of urinary urgency and strong smelling urine.  No fever or back pain .Appetite is good.  She has been doing well for last few  months.  Eating well.  Last UTI >8 months ago rec Levaquin 500mg  daily for 7 days  Drink plenty of water.  Empty bladder completely .   08/29/2011 f/u ov/Yesly Gerety cc continues mild gen weakness, no cough or sob. Husband doing much better keeping up with meds/ med calendar >>no changes   12/06/2011 Follow up  Returns for 3 month follow up .  Appetite has improved , and weight is closer to her normal since beginning exelon patch started end of 2012 .  Continue to use [place] and A/D ointment to buttock.  We discussed her sedentary lifestyle and need to change positions frequently  rec Continue on same meds.    03/12/2012 f/u ov/Hulon Ferron cc sore throat p choking on candy x 2 weeks, some better p cough drops temporarily.  No unusual cough, purulent sputum or sinus/hb symptoms on present rx. No sob/ wheezing. rec No change rx, follow med calendar and bring it to each ov  08/06/2012 f/u ov/Emiyah Spraggins cc  Pt c/o low back pain, and dark and foul smelling urine x 3 days  No abd pain n or v or rigors >cipro x 5 d   11/03/2012 Follow up  Returns for routine follow up  She is accompanied by her husband.  At her baseline, with no chest pain, increased dyspnea or edema.  Memory loss has  stabilized on Exelon and Namenda, continues to see Dr. Zipporah Plants at [facility].  Had URI 2/27, tx w/ zpack with resolution We discussed upcoming screenings such as mammogram and colon -she /husband decline at this time-do not want to do these in future .  rec ceftin for uti/ R to cipro, rx x 7 days  12/22/12  Bren Borys/ acute ov Chief Complaint  Patient presents with  . Acute Visit    Pt c/o dark, foul smelling urine x 3-4 days.    no fever, back pain, dysuria. No longer f/u with [facility]  Sleeping ok without nocturnal  or early am exacerbation  of respiratory  c/o's or need for noct saba. Also denies any obvious fluctuation of symptoms with weather or environmental changes or other aggravating or alleviating factors except as outlined above    Current Medications, Allergies, Past Medical History, Past Surgical History, Family History, and Social History were reviewed in [employer] record.  ROS  The following are not active complaints unless bolded sore throat, dysphagia, dental problems, itching, sneezing,  nasal congestion or excess/ purulent secretions, ear ache,   fever, chills, sweats, unintended wt loss, pleuritic or exertional cp, hemoptysis,  orthopnea pnd or leg swelling, presyncope, palpitations, heartburn, abdominal pain, anorexia, nausea, vomiting, diarrhea  or change in bowel or urinary habits, change in stools or urine, dysuria,hematuria,  rash, arthralgias, visual complaints, headache, numbness weakness or ataxia or problems with walking or coordination,  change in mood/affect or memory.      Past Medical History:  COUGH (ICD-786.2)  DEPRESSION (ICD-311)  ANXIETY, CHRONIC (ICD-300.00)  COLONIC POLYPS (ICD-211.3)  DIVERTICULITIS, COLON (ICD-562.11)  - colonoscopy 03/21/2004  HYPERLIPIDEMIA, MIXED (ICD-272.2)  BARRETT'S ESOPHAGUS, HX OF (ICD-V12.79)..............................[facility]  - EGD 03/21/2004  OBESITY (ICD-278.00)  - Target wt = < 158 for BMI < 30  UTI'S, CHRONIC (ICD-599.0)............................................................. Margarita Grizzle / [facility] OSTEOARTHRITIS (ICD-715.90)  ASTHMATIC BRONCHITIS, ACUTE (ICD-466.0)  DEMENTIA, MILD (ICD-294.8) >followed by [facility]  HYPERTENSION (ICD-401.9)  RHINITIS (ICD-472.0)  Sacral Decubitus -wound care > referred to [facility] 03/21/2011    COMPLEX MED REGIMEN  ---.Meds reviewed with pt education and computerized med calendar completed/adjusted-08/19/2008, June 26, 2010 , 02/02/11           Objective:   Physical Exam  GEN: A/Ox2 ;  NAD , elderly and frail w/c bound Wt 151 08/04/12 >155 11/03/2012 > 151 12/22/2012     HEENT:  [place]/AT,  EACs-clear, TMs-wnl, NOSE-clear drainage , THROAT-clear, no lesions,  no postnasal drip or exudate noted.   NECK:  Supple w/ fair ROM; no JVD; normal carotid impulses w/o bruits; no thyromegaly or nodules palpated; no lymphadenopathy.  RESP  Clear  P & A; w/o, wheezes/ rales/ or rhonchi.no accessory muscle use, no dullness to percussion  CARD:  RRR, no m/r/g  , no peripheral edema, pulses intact, no cyanosis or clubbing.  GI:   Soft & nt; nml bowel sounds; no organomegaly or masses detected.no guarding or rebound, no cvat  Musco: Warm bil, no deformities or joint swelling noted.   Neuro: alert, no focal deficits noted.  Oriented to person and place  Skin: Warm, no rash .            Assessment & Plan:

## 2012-12-22 NOTE — Telephone Encounter (Signed)
I spoke with [name]. Okay to add pt on at 1:30. i have done so and appt scheduled. Nothing further was needed

## 2012-12-23 NOTE — Assessment & Plan Note (Signed)
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  I had an extended discussion with the patient and husband today lasting 15 to 20 minutes of a 25 minute visit  Outlining todays' plan and  Each maintenance medication was reviewed in detail including most importantly the difference between maintenance and as needed and under what circumstances the prns are to be used. This was done in the context of a medication calendar review which provided the patient with a user-friendly unambiguous mechanism for medication administration and reconciliation and provides an action plan for all active problems. It is critical that this be shown to every doctor  for modification during the office visit if necessary so the patient can use it as a working document.

## 2013-04-14 ENCOUNTER — Telehealth: Payer: Self-pay | Admitting: Neurology

## 2013-04-14 DIAGNOSIS — F0390 Unspecified dementia without behavioral disturbance: Secondary | ICD-10-CM

## 2013-04-16 ENCOUNTER — Telehealth: Payer: Self-pay | Admitting: Neurology

## 2013-04-19 ENCOUNTER — Other Ambulatory Visit: Payer: Self-pay

## 2013-04-19 MED ORDER — DONEPEZIL HCL 10 MG PO TABS: ORAL_TABLET | ORAL | Status: DC

## 2013-04-20 ENCOUNTER — Other Ambulatory Visit: Payer: Self-pay | Admitting: Neurology

## 2013-04-20 DIAGNOSIS — F0390 Unspecified dementia without behavioral disturbance: Secondary | ICD-10-CM

## 2013-04-20 MED ORDER — DONEPEZIL HCL 10 MG PO TABS: ORAL_TABLET | ORAL | Status: DC

## 2013-04-20 MED ORDER — DONEPEZIL HCL 5 MG PO TABS: 5.0000 mg | ORAL_TABLET | Freq: Every day | ORAL | Status: DC

## 2013-04-20 NOTE — Telephone Encounter (Signed)
Called patient he stated he could not understand Dr. Vickey Huger accent  So he just ordered his wife namenda. He also stated he will try and cancel the order with [facility] if he cant  his wife can take the namenda .And once that runs out start Aricept.

## 2013-04-21 NOTE — Telephone Encounter (Signed)
[name] spoke to husband.

## 2013-04-22 NOTE — Telephone Encounter (Signed)
Start aricept -Donepizil 5 mg daily , for 30 days , before going to 10 mg daily.  you can stop namenda - I am concerned that th  It is  that expensive on your plan? Are you able  stop the refill of Namenda and start the aricept right away.    P triage : Please call and give them these informations.

## 2013-04-23 ENCOUNTER — Telehealth: Payer: Self-pay | Admitting: *Deleted

## 2013-04-24 ENCOUNTER — Telehealth: Payer: Self-pay

## 2013-04-24 NOTE — Telephone Encounter (Signed)
I called and left patient a VM that I understand they purchased Namenda inadvertantly, it is too expensive and have the Aricept as well for patient. Dr. Vickey Huger wants patient to go ahead and take the Namenda. When that runs out, patient can take the Aricept and we will then continue with that. Should you have any questions, please call back to [phone].

## 2013-04-29 ENCOUNTER — Telehealth: Payer: Self-pay | Admitting: Neurology

## 2013-04-29 ENCOUNTER — Telehealth: Payer: Self-pay | Admitting: Internal Medicine

## 2013-04-29 MED ORDER — AZITHROMYCIN 250 MG PO TABS: 250.0000 mg | ORAL_TABLET | Freq: Every day | ORAL | Status: DC

## 2013-04-29 NOTE — Telephone Encounter (Signed)
Spouse is aware of recs from MW and understands ER if worsens/OV if not improving by end of week. Rx sent electronically to pharmacy.

## 2013-04-29 NOTE — Telephone Encounter (Signed)
Called spoke with spouse who reports pt is having dry cough, chest congestion, chest tightness, difficulty swallowing d/t raw/sore throat, low grade temp, PND x2 weeks.  Denies SOB, head congestion, nausea, vomiting, hemoptysis, edema.    TP off today and tomorrow post-call and MW with openings tomorrow but Jesusita Oka is requesting recs today. [facility] Allergies  Allergen Reactions  . Penicillins     REACTION: adolesent   Dr Sherene Sires please advise, thank you.

## 2013-04-29 NOTE — Telephone Encounter (Signed)
zpak > f/u by end of week if not improving > if gets worse to ER in meantime

## 2013-04-29 NOTE — Telephone Encounter (Signed)
I spoke to husband to get clarification on what exactly his wife was taking.  Right now she is taking the Namenda, but not the Aricept, he didn't have them fill the Aricept.  The patient is also on the Exelon patch 13.3mg .   He does not want to continue the Namenda once the prescription runs out because of the cost. He will have the Aricept filled when Namenda is finished.  He understands that she starts on the Aricept 5mg  for 30days and then goes to Aricept 10mg .  He asked if the patient needs to continue the Exelon patch also.   Please advise.

## 2013-05-04 NOTE — Telephone Encounter (Signed)
One can take Namenda with Aricept OR with Excelon . [name] wanted to change to Aricept because of medication costs, she can not take it with excelon. Only one of the 2 meds, and yes Namenda would be a combination. But here she had great cost concerns , too.

## 2013-05-05 NOTE — Telephone Encounter (Signed)
Late entry, I spoke to patient's husband on 05-01-13 and explained that the patient can take Namenda with Excelon patch or with Aricept.  The patient is now on [place].  When the Namenda prescription runs out, I told husband to either keep Excelon patch and not fill Aricept or if Excelon is too expensive he can stop the Excelon and start the Aricept.  He or I will call to try and help him coordinate how he wishes to proceed.

## 2013-05-06 NOTE — Telephone Encounter (Signed)
See notes from 10/15

## 2013-06-04 ENCOUNTER — Encounter: Payer: Self-pay | Admitting: Nurse Practitioner

## 2013-06-10 ENCOUNTER — Encounter (INDEPENDENT_AMBULATORY_CARE_PROVIDER_SITE_OTHER): Payer: Self-pay

## 2013-06-10 ENCOUNTER — Encounter: Payer: Self-pay | Admitting: Nurse Practitioner

## 2013-06-10 ENCOUNTER — Ambulatory Visit (INDEPENDENT_AMBULATORY_CARE_PROVIDER_SITE_OTHER): Payer: Medicare Other | Admitting: Nurse Practitioner

## 2013-06-10 VITALS — BP 133/90 | HR 83 | Temp 97.9°F | Wt 153.0 lb

## 2013-06-10 DIAGNOSIS — F0280 Dementia in other diseases classified elsewhere without behavioral disturbance: Secondary | ICD-10-CM

## 2013-06-10 DIAGNOSIS — F068 Other specified mental disorders due to known physiological condition: Secondary | ICD-10-CM

## 2013-06-10 DIAGNOSIS — F0281 Dementia in other diseases classified elsewhere with behavioral disturbance: Secondary | ICD-10-CM | POA: Insufficient documentation

## 2013-06-10 DIAGNOSIS — F0390 Unspecified dementia without behavioral disturbance: Secondary | ICD-10-CM

## 2013-06-10 DIAGNOSIS — F02A18 Dementia in other diseases classified elsewhere, mild, with other behavioral disturbance: Secondary | ICD-10-CM | POA: Insufficient documentation

## 2013-06-10 MED ORDER — DONEPEZIL HCL 10 MG PO TABS: ORAL_TABLET | ORAL | Status: DC

## 2013-06-10 NOTE — Progress Notes (Signed)
[facility]  PATIENT: [name] DOB: 07-28-1930   REASON FOR VISIT: follow up HISTORY FROM: patient  HISTORY OF PRESENT ILLNESS: [name], 79 yrs old returns for follow-up. She was last seen June 21/2010. Pt reports memory a little worse. Increasing difficulties with word finding. Continues to be independent in ADL's. No longer cooks, not driven in 6 yrs.  She is currently  on Aricept and Namenda. Her MMT today shows a continued decline  from 23 to 15 points. Her AFT is reduced to 4 from 7. Pt and husband deny any behavior issues, appetite is stable, sleeping well. Denies hallucinations. No new neurolpgic complaints. See ROS.  05-11-11, Rv for progressive dementia, gait disorder, poverty of speech. uses walker. MMSE is 14 /30 today, AFT only 2 names, cannot abstract- the apple doesn't fall far from the tree- "  what does this mean - "nobody gets hurt". has hallucinations, believes there is a  woman in her husbands bed. insistent. He noted  nightly talking and sometimes whole conversations , sometimes mumbling. her cognitive decline is evident as is her physical decline - she denies changes in eating habits and her husband mentioned her apetite increased 14 days ago.  He is interested in another dementia medication and asked explicitly about Alzheimer's research.    9-4 -13 Rv for dementia follow up, todays MMSe is 10 out of 30 points.  she is off aricept and now on exelon patches ,  and she continued on namenda  acc. to my records at [facility], but husband was unsure.  Long medication list.  06-09-13 (LL):  Patient is here for dementia follow up, she needs assistance with all IADL's and ADLs, has poverty of speech, cannot contemplate abstract expressions or recall past a minute or so.   Husband cannot afford Namenda anymore, and would like to switch from Exelon patches to Donepizil to save money.  MMSE 12/30, AFT only 1, Clock drawing 0/4. Uses rolling walker at all  times, denies falls.  FAST score 6e.  REVIEW OF SYSTEMS: Full 14 system review of systems performed and notable only for: memory loss, confusion  ALLERGIES: Allergies  Allergen Reactions  . Penicillins     REACTION: adolesent    HOME MEDICATIONS: Outpatient Prescriptions Prior to Visit  Medication Sig Dispense Refill  . albuterol (PROVENTIL HFA;VENTOLIN HFA) 108 (90 BASE) MCG/ACT inhaler Inhale 2 puffs into the lungs every 4 (four) hours as needed for wheezing or shortness of breath.      . ALPRAZolam (XANAX) 0.25 MG tablet Take 1 tablet (0.25 mg total) by mouth every 8 (eight) hours as needed.  60 tablet  2  . aspirin 325 MG EC tablet Take 325 mg by mouth daily.        Marland Kitchen azithromycin (ZITHROMAX) 250 MG tablet Take 1 tablet (250 mg total) by mouth daily.  6 tablet  0  . cefUROXime (CEFTIN) 250 MG tablet Take 1 tablet (250 mg total) by mouth 2 (two) times daily.  20 tablet  0  . citalopram (CELEXA) 20 MG tablet Take 1 tablet (20 mg total) by mouth daily.  90 tablet  3  . cloNIDine (CATAPRES) 0.1 MG tablet Take 1 tablet (0.1 mg total) by mouth daily.  90 tablet  3  . dexbrompheniramine-pseudoephedrine (DRIXORAL) 6-120 MG per tablet Take 1 tablet by mouth daily as needed (for drippy nose).      Marland Kitchen donepezil (ARICEPT) 10 MG tablet Take 5 mg (one half tab) po  qd for 30 days then increase to 10mg  (one full tab) po qd  90 tablet  1  . donepezil (ARICEPT) 5 MG tablet Take 1 tablet (5 mg total) by mouth at bedtime.  30 tablet  0  . estropipate (OGEN) 1.5 MG tablet Take 1 tablet (1.5 mg total) by mouth every morning.  90 tablet  3  . famotidine (PEPCID) 20 MG tablet Take 20 mg by mouth at bedtime.        Marland Kitchen HYDROcodone-acetaminophen (VICODIN) 5-500 MG per tablet Take 1 tablet by mouth every 6 (six) hours as needed (for severe pain).      . hyoscyamine (LEVSIN, ANASPAZ) 0.125 MG tablet 1-2 every 6 hours as needed for abdominal cramps      . ibuprofen (ADVIL,MOTRIN) 200 MG tablet 3 tabs with meal twice  daily as needed      . memantine (NAMENDA) 10 MG tablet Take 10 mg by mouth 2 (two) times daily.        . mometasone (NASONEX) 50 MCG/ACT nasal spray Place 1-2 sprays into the nose 2 (two) times daily.       Marland Kitchen nystatin (MYCOSTATIN) powder Apply topically 4 (four) times daily. Apply four times daily as needed  30 g  11  . oxybutynin (DITROPAN) 5 MG tablet Take 1 tablet (5 mg total) by mouth 3 (three) times daily.  270 tablet  3  . pantoprazole (PROTONIX) 40 MG tablet Take 1 tablet (40 mg total) by mouth daily before breakfast.  90 tablet  3  . potassium chloride SA (K-DUR,KLOR-CON) 20 MEQ tablet Take 1 tablet (20 mEq total) by mouth every morning.  90 tablet  3  . promethazine (PHENERGAN) 25 MG tablet Take 25 mg by mouth every 6 (six) hours as needed.        . Rivastigmine (EXELON) 13.3 MG/24HR PT24 Place 1 patch onto the skin daily.      Marland Kitchen triamterene-hydrochlorothiazide (MAXZIDE) 75-50 MG per tablet Take 1 tablet by mouth daily as needed.        . valACYclovir (VALTREX) 1000 MG tablet Take 1 tablet (1,000 mg total) by mouth daily as needed.       No facility-administered medications prior to visit.    PAST MEDICAL HISTORY: Past Medical History  Diagnosis Date  . Cough   . Depressive disorder, not elsewhere classified   . Anxiety state, unspecified   . Benign neoplasm of colon   . Diverticulitis of colon (without mention of hemorrhage)   . Mixed hyperlipidemia   . Obesity, unspecified   . Personal history of other diseases of digestive system   . Urinary tract infection, site not specified   . Osteoarthrosis, unspecified whether generalized or localized, unspecified site   . Acute bronchitis   . Other persistent mental disorders due to conditions classified elsewhere   . Unspecified essential hypertension   . Chronic rhinitis     PAST SURGICAL HISTORY: Past Surgical History  Procedure Laterality Date  . Cholecystectomy    . Total hip arthroplasty    . Vesicovaginal fistula  closure w/ tah    . Rotator cuff repair      FAMILY HISTORY: Family History  Problem Relation Age of Onset  . Breast cancer Mother   . Colon cancer Mother     SOCIAL HISTORY: History   Social History  . Marital Status: Married    Spouse Name: Reuel Boom    Number of Children: 6  . Years of Education: 45  Occupational History  . Housewife    Social History Main Topics  . Smoking status: Former Smoker -- 0.20 packs/day for 2 years    Types: Cigarettes  . Smokeless tobacco: Never Used     Comment: smoked 2 cigarettes a week  . Alcohol Use: No  . Drug Use: No  . Sexual Activity: Not on file   Other Topics Concern  . Not on file   Social History Narrative   Patient is married Reuel Boom) and lives with her husband.   Patient is a homemaker.   Patient has 6 children.   Patient is right-handed.   Patient has a high school education.   Patient does not drink any caffeine.     PHYSICAL EXAM  Filed Vitals:   06/10/13 1503  BP: 133/90  Pulse: 83  Temp: 97.9 F (36.6 C)  Weight: 153 lb (69.4 kg)   Body mass index is 28.92 kg/(m^2).  Physical Exam  General: seated, in no evident distress Head: head  normocephalic and atraumatic. Neck: supple with no carotid or supraclavicular bruits. Respiratory: LCA Cardiovascular: regular rate and rhythm, no murmurs Skin: no edema   Neurologic Exam  Mental Status: Awake and  alert.  MMSE today score 12/30  (last visit 03-19-12  MMSE she scored 10 /30, AFT 2).  Animal Fluency in 1 minute is 1 only.  Clock drawing is 0/4. Mood and affect appropriate- she appears lost, not distressed. Cranial Nerves:  Pupils equal, briskly reactive to light.  Extraocular movements full without nystagmus.  Visual fields full to confrontation.  Hearing is severely impaired, she couldn't hear vibration by  bone/air conduction. Facial sensation intact.  Face with long standing droop on the left, tongue, palate move normally and symmetrically. shoulder  droop in the left, retracted shoulder on the right with failure of relaxation.    Motor:  atrophy of proximal muscles, deconditioning.  Sensory: no numnbess  Coordination: Rapid alternating movements are slow in all extremities.  Finger-to-nose performed accurately bilaterally. Gait and Station: Arises from chair without difficulty. Stance is wide based. Ambulated with rolling walker, no difficulty with turns.  Reflexes: Diminished and symmetric.  Toes downgoing.  DIAGNOSTIC DATA (LABS, IMAGING, TESTING) - I reviewed patient records, labs, notes, testing and imaging myself where available.  Lab Results  Component Value Date   WBC 8.8 11/03/2012   HGB 14.4 11/03/2012   HCT 42.9 11/03/2012   MCV 96.1 11/03/2012   PLT 164.0 11/03/2012      Component Value Date/Time   NA 137 11/03/2012 1506   K 4.3 11/03/2012 1506   CL 102 11/03/2012 1506   CO2 30 11/03/2012 1506   GLUCOSE 120* 11/03/2012 1506   BUN 15 11/03/2012 1506   CREATININE 1.0 11/03/2012 1506   CALCIUM 8.6 11/03/2012 1506   PROT 7.4 11/03/2012 1506   ALBUMIN 3.4* 11/03/2012 1506   AST 13 11/03/2012 1506   ALT 8 11/03/2012 1506   ALKPHOS 60 11/03/2012 1506   BILITOT 0.6 11/03/2012 1506   GFRNONAA >60 01/02/2011 1845   GFRAA >60 01/02/2011 1845   Lab Results  Component Value Date   TSH 1.52 11/03/2012    ASSESSMENT AND PLAN [name] is an 79 year-old Caucasian female with PMH significant for light smoking, hypertension, asthmatic bronchitis, osteoarthritis, gait disorder and Major Neurocognitive Disorder due to probable Alzheimer's disease without behavioral disturbance.  Dementia is moderate to severe.  She requires help with all ADL's.  She is unaware of her halluciantions, reported  by her spouse.    Cost of Exelon patch and Namenda have become too much.  They would like to start Donepizil because it is more affordable (generic).  PLAN: Continue Namenda until it runs out and then discontinue due to cost.  Start Aricept 5mg   daily for 1 month then increase to 10 mg daily. Follow up in 1 year with Dr. Vickey Huger. Vst time 35 min.   Ronal Fear, MSN, NP-C 06/10/2013, 3:57 PM [facility] [address], [address] [place], [place] [address] [phone]  Note: This document was prepared with digital dictation and possible smart phrase technology. Any transcriptional errors that result from this process are unintentional.

## 2013-06-10 NOTE — Patient Instructions (Signed)
Continue Namenda until it runs out and then discontinue due to cost.  Start Aricept 5mg  daily for 1 month then increase to 10 mg daily. Follow up in 1 year.

## 2013-07-28 ENCOUNTER — Telehealth: Payer: Self-pay | Admitting: Internal Medicine

## 2013-07-28 NOTE — Telephone Encounter (Signed)
ATC [name] again and still was not able to reach anyone wcb

## 2013-07-28 NOTE — Telephone Encounter (Signed)
[place]. Was on hold x 7 min and never reached a receptionists. WCB

## 2013-07-29 NOTE — Telephone Encounter (Signed)
Called and spoke with Caryl Pina rn from Dr. Delton Coombes office.  She is aware of MW recs and she will relay the message back to Dr. Jasmine December and will call and make the pt aware.  Nothing further is needed.

## 2013-07-29 NOTE — Telephone Encounter (Signed)
I spoke with Caryl Pina at [facility] and she states that the pt has a UTI and the only medication that will treat it are levaquin and IV vanc. The issue they are having is the interaction between levaquin and citalopram. They want to know can you recommend a taper of the citalopram so the pt can come off of it and take the levaquin? They state they will have her restart the citalopram once her levaquin dose is complete. Please advise. [place] Bing, CMA

## 2013-07-29 NOTE — Telephone Encounter (Signed)
No need to taper if just off for a week - ok to d/c and restart. If any symptoms as taper ok to restart citalopram as the risk of arrythmia is very very low relative to the risk of untreated uti

## 2013-08-03 ENCOUNTER — Telehealth: Payer: Self-pay | Admitting: Neurology

## 2013-08-03 NOTE — Telephone Encounter (Signed)
[name], patient's daughter, calling regarding health status changes in her mother since medication changes.  Patient is not eating. Patient does not want to get out of bed. Please call to advise.

## 2013-08-03 NOTE — Telephone Encounter (Signed)
Please advise 

## 2013-08-04 NOTE — Telephone Encounter (Signed)
Is the patient seeing a counselor or psychiatrist?  This is not so much neurologic as psychological .Please ask her PCP to refer her .

## 2013-08-05 NOTE — Telephone Encounter (Signed)
RETURNING CALL  °

## 2013-08-06 NOTE — Telephone Encounter (Signed)
Daughter, [name], called about mother.  Pt is finishing abx for UTI and is now some better (relating to knowing her husband, leaving house, etc). Pt now on aricept (not sure of dose).    Feels like the pt was better on exelon patch, and if they can go back on this if this is the best combination for her.  They will speak to her father about cost and benefit for pt.  Then may touch base with Janett Billow about financial assistance.  Please advise.  Pt had been taking namenda and exelon patch.  See last note.  I did relay that pt having UTI can have acute sx of confusion, worsening dementia sx.  Please advise.

## 2013-08-06 NOTE — Telephone Encounter (Signed)
Cost was an issue with both Namenda and Exelon. If they want to go back to either it is ok, just cannot be on Exelon and Aricept at the same time.

## 2013-08-06 NOTE — Telephone Encounter (Signed)
PT's daughter was returning Sandy's call.  She stated that the [place] had gotten too expensive ($800.00 for both).  Pt's husband called to see if they could change it and they put her back on Aricept.  Since then they have seen the changes where there was a day that she didn't know her husband, the not wanting to eat or get out of bed.  Daughter stated that her mother just recently has gotten over a bladder infection and didn't know if that might be a contributing factor to the changes.  Please call daughter back at [phone].  Thank you

## 2013-08-06 NOTE — Telephone Encounter (Signed)
I called and LMVM for daughter at her work #.  I then called home and spoke to son in law.   He stated that pt has digressed, not eating , not wanting to get out of bed.  Thinks it is related to dementia

## 2013-08-07 NOTE — Telephone Encounter (Signed)
LMVM for [name], re: ok to go back to [place] or Exelon and see how [name] does.  If combination of both, exelon and namenda is ok, just not aricept and exelon.  She will call back.

## 2013-08-12 NOTE — Telephone Encounter (Signed)
[name] was returning Sandy's call from last week.  Please call.  Her # is [phone]

## 2013-08-13 ENCOUNTER — Other Ambulatory Visit: Payer: Self-pay | Admitting: Nurse Practitioner

## 2013-08-13 MED ORDER — MEMANTINE HCL ER 28 MG PO CP24: 1.0000 | ORAL_CAPSULE | Freq: Every morning | ORAL | Status: DC

## 2013-08-13 NOTE — Telephone Encounter (Signed)
Ok, I sent in a script for Namenda XR 28 mg to [facility] for use after the starter pack.

## 2013-08-13 NOTE — Telephone Encounter (Signed)
I called and spoke to Hume, daughter.   She had spoken to father and they will go ahead and start using the namenda again with the aricept ($RemoveBefo'10mg'IXjamzzUrvy$  po daily).  Will place starter kit  (sample for p/u).  New prescription would need to be called in.  I relayed that aricept and exelon cannot be used together.   She verbalizied understanding.    I did call and relay the above information to the husband of pt.   Will try this combination and see how it works, if wants to change things will call us back.  Husband to p/u sample.

## 2013-08-13 NOTE — Telephone Encounter (Signed)
I called and LMVM for daughter re: meds for mother.

## 2013-08-14 MED ORDER — MEMANTINE HCL ER 28 MG PO CP24: 28.0000 mg | ORAL_CAPSULE | Freq: Every day | ORAL | Status: DC

## 2013-08-14 NOTE — Telephone Encounter (Signed)
Husband came to p/u samples of namenda starter kit.  I also gave sample of Namenda XR 28 mg tab.  He wanted to use the 30 day free card that we gave him for Namenda XR.   Would like to have sent to [facility].

## 2013-08-14 NOTE — Telephone Encounter (Signed)
One month supply of meds has been sent to [facility] per patient request to use voucher once starter pack is completed.

## 2013-08-19 ENCOUNTER — Telehealth: Payer: Self-pay | Admitting: Neurology

## 2013-08-19 NOTE — Telephone Encounter (Signed)
I called back.  [name] said he does not wish to speak to anyone besides Liane Comber.  I tried to assist, but he declined.  Told him I will forward the message to her.

## 2013-08-19 NOTE — Telephone Encounter (Signed)
Patient's husband calling to ask a question about patient's medication, has some questions about the dosage. Please call patient's husband and advise.

## 2013-08-20 NOTE — Telephone Encounter (Signed)
I called and spoke to husband.  Pt to finish Namenda starter kit (ending 87m po bid) then will go to Namend 225mXR once daily.   He verbalized understanding.   Could not use drug coupon since [facility] already addressed prescription to them.   He will hold on to this.  To call back if questions.

## 2013-09-02 ENCOUNTER — Telehealth: Payer: Self-pay | Admitting: Internal Medicine

## 2013-09-02 MED ORDER — ESTROPIPATE 1.5 MG PO TABS: 1.5000 mg | ORAL_TABLET | ORAL | Status: DC

## 2013-09-02 MED ORDER — POTASSIUM CHLORIDE CRYS ER 20 MEQ PO TBCR: 20.0000 meq | EXTENDED_RELEASE_TABLET | ORAL | Status: DC

## 2013-09-02 MED ORDER — CLONIDINE HCL 0.1 MG PO TABS: 0.1000 mg | ORAL_TABLET | Freq: Every day | ORAL | Status: DC

## 2013-09-02 NOTE — Telephone Encounter (Signed)
Rx's have been sent in. Pt's husband is aware that pt needs an appointment. Also aware that MW is not doing PC much anymore and if they do continue seeing him they will have to pay the specialist co pay through their insurance. He states that they will try to get in with primary care on the first floor.

## 2013-10-08 ENCOUNTER — Encounter: Payer: Self-pay | Admitting: Physician Assistant

## 2013-10-08 ENCOUNTER — Ambulatory Visit (INDEPENDENT_AMBULATORY_CARE_PROVIDER_SITE_OTHER): Payer: Medicare Other | Admitting: Physician Assistant

## 2013-10-08 ENCOUNTER — Other Ambulatory Visit (INDEPENDENT_AMBULATORY_CARE_PROVIDER_SITE_OTHER): Payer: Medicare Other

## 2013-10-08 VITALS — BP 110/74 | HR 90 | Temp 97.4°F | Ht 61.0 in | Wt 155.8 lb

## 2013-10-08 DIAGNOSIS — R399 Unspecified symptoms and signs involving the genitourinary system: Secondary | ICD-10-CM

## 2013-10-08 DIAGNOSIS — R3989 Other symptoms and signs involving the genitourinary system: Secondary | ICD-10-CM

## 2013-10-08 LAB — URINALYSIS, ROUTINE W REFLEX MICROSCOPIC
BILIRUBIN URINE: NEGATIVE
Ketones, ur: NEGATIVE
Nitrite: NEGATIVE
Specific Gravity, Urine: 1.02 (ref 1.000–1.030)
Urine Glucose: NEGATIVE
Urobilinogen, UA: 0.2 (ref 0.0–1.0)
pH: 6 (ref 5.0–8.0)

## 2013-10-08 MED ORDER — SULFAMETHOXAZOLE-TMP DS 800-160 MG PO TABS: 1.0000 | ORAL_TABLET | Freq: Two times a day (BID) | ORAL | Status: DC

## 2013-10-08 NOTE — Progress Notes (Signed)
   Subjective:    Patient ID: [name], female    DOB: 16-Sep-1930, 80 y.o.   MRN: [id number]  HPI Comments: Patient is an 80 year old female who presents to the office with complaint of lower urinary symptoms. States in last couple days she and her husband of 69 and a half years have noticed a strong odor to her urine, states smell ammonia like. She has also had a cloudy urine with a slight burn at end of urination. Denies hematuria. Husband states patient with early dementia.   Review of Systems  Constitutional: Negative for fever and chills.  Gastrointestinal: Negative for nausea, vomiting and abdominal pain.  Genitourinary: Positive for dysuria and frequency. Negative for hematuria and flank pain.       Ammonia like odor  Neurological: Negative for dizziness, weakness and light-headedness.       Past Medical History  Diagnosis Date  . Cough   . Depressive disorder, not elsewhere classified   . Anxiety state, unspecified   . Benign neoplasm of colon   . Diverticulitis of colon (without mention of hemorrhage)   . Mixed hyperlipidemia   . Obesity, unspecified   . Personal history of other diseases of digestive system   . Urinary tract infection, site not specified   . Osteoarthrosis, unspecified whether generalized or localized, unspecified site   . Acute bronchitis   . Other persistent mental disorders due to conditions classified elsewhere   . Unspecified essential hypertension   . Chronic rhinitis     Objective:   Physical Exam  Vitals reviewed. Constitutional: She is oriented to person, place, and time. She appears well-developed and well-nourished.  HENT:  Head: Normocephalic and atraumatic.  Eyes: Conjunctivae are normal.  Neck: Normal range of motion.  Cardiovascular: Normal rate and regular rhythm.  Exam reveals no gallop and no friction rub.   No murmur heard. Pulmonary/Chest: Effort normal and breath sounds normal. She has no wheezes. She has no rales.    Abdominal: Soft. Normal appearance and bowel sounds are normal. There is no tenderness. There is no rigidity, no rebound, no guarding and no CVA tenderness.  Neurological: She is alert and oriented to person, place, and time.  Skin: Skin is warm and dry.  Psychiatric: She has a normal mood and affect.     Filed Vitals:   10/08/13 0809  BP: 110/74  Pulse: 90  Temp: 97.4 F (36.3 C)       Assessment & Plan:    UTI: Patient unable to produce sample in office, provided collection equipment and husband will drop off later this am.  Rx for Bactrim for three days RTO if no improvement of symptoms.

## 2013-10-08 NOTE — Progress Notes (Signed)
Pre visit review using our clinic review tool, if applicable. No additional management support is needed unless otherwise documented below in the visit note. 

## 2013-10-08 NOTE — Patient Instructions (Signed)
It was great to meet you today [name]!  Urinary Tract Infection A urinary tract infection (UTI) can occur any place along the urinary tract. The tract includes the kidneys, ureters, bladder, and urethra. A type of germ called bacteria often causes a UTI. UTIs are often helped with antibiotic medicine.  HOME CARE   If given, take antibiotics as told by your doctor. Finish them even if you start to feel better.  Drink enough fluids to keep your pee (urine) clear or pale yellow.  Avoid tea, drinks with caffeine, and bubbly (carbonated) drinks.  Pee often. Avoid holding your pee in for a long time.  Pee before and after having sex (intercourse).  Wipe from front to back after you poop (bowel movement) if you are a woman. Use each tissue only once. GET HELP RIGHT AWAY IF:   You have back pain.  You have lower belly (abdominal) pain.  You have chills.  You feel sick to your stomach (nauseous).  You throw up (vomit).  Your burning or discomfort with peeing does not go away.  You have a fever.  Your symptoms are not better in 3 days. MAKE SURE YOU:   Understand these instructions.  Will watch your condition.  Will get help right away if you are not doing well or get worse. Document Released: 12/19/2007 Document Revised: 03/26/2012 Document Reviewed: 01/31/2012 [facility] Patient Information 2014 [place], [place].

## 2013-10-11 LAB — URINE CULTURE

## 2013-10-12 ENCOUNTER — Telehealth: Payer: Self-pay | Admitting: Internal Medicine

## 2013-10-12 NOTE — Telephone Encounter (Signed)
Patients husband brought in urine for a culture last week for [name].  She will be establishing in May with Dr. Asa Lente.  She is needing to know the results of the labs.  Please advice.

## 2013-10-13 MED ORDER — LEVOFLOXACIN 250 MG PO TABS: 250.0000 mg | ORAL_TABLET | Freq: Every day | ORAL | Status: DC

## 2013-10-13 MED ORDER — LEVOFLOXACIN 250 MG PO TABS: 250.0000 mg | ORAL_TABLET | Freq: Every day | ORAL | Status: AC

## 2013-10-13 NOTE — Telephone Encounter (Signed)
Ucx shows enterococcus infx tx with levaquin 250mg  qd x 7 d - erx done thanks

## 2013-10-13 NOTE — Telephone Encounter (Signed)
Called pt husband no answer LMOM with md response...[name]

## 2013-11-04 ENCOUNTER — Other Ambulatory Visit: Payer: Self-pay | Admitting: Nurse Practitioner

## 2013-12-04 ENCOUNTER — Ambulatory Visit (INDEPENDENT_AMBULATORY_CARE_PROVIDER_SITE_OTHER): Payer: Medicare Other | Admitting: Internal Medicine

## 2013-12-04 ENCOUNTER — Encounter: Payer: Self-pay | Admitting: Internal Medicine

## 2013-12-04 VITALS — BP 120/72 | HR 74 | Temp 97.7°F | Ht 61.0 in | Wt 154.4 lb

## 2013-12-04 DIAGNOSIS — I10 Essential (primary) hypertension: Secondary | ICD-10-CM

## 2013-12-04 DIAGNOSIS — F0390 Unspecified dementia without behavioral disturbance: Secondary | ICD-10-CM

## 2013-12-04 DIAGNOSIS — F411 Generalized anxiety disorder: Secondary | ICD-10-CM

## 2013-12-04 DIAGNOSIS — L8991 Pressure ulcer of unspecified site, stage 1: Secondary | ICD-10-CM

## 2013-12-04 DIAGNOSIS — G309 Alzheimer's disease, unspecified: Principal | ICD-10-CM

## 2013-12-04 DIAGNOSIS — F0280 Dementia in other diseases classified elsewhere without behavioral disturbance: Secondary | ICD-10-CM

## 2013-12-04 NOTE — Progress Notes (Signed)
Subjective:    Patient ID: [name], female    DOB: 07/29/1930, 80 y.o.   MRN: [id number]  HPI  New pt to me, transfer from Dr Melvyn Novas to establish with new PCP Reviewed chronic medical issues and interval medical events  Advanced dementia, progressive cognitive decline - follows with neuro for same, on namenda and aricept - 24h care and supervision by spouse Linna Hoff) - no falls in past 68mo, uses RW without resistance  hypertension - If you develop worsening symptoms or fever, call and we can reconsider antibiotics, but it does not appear necessary to use antibiotics at this time.  Osteoarthritis - L shoulder, knees, back - chronic symptoms without recent change - uses tylenol - no swelling or recent injury  Pressure ulcer - chronic sacral redness, but no ulcer or open skin in >1 year - spouse with careful attention to repositioning frequently and barrier cream.desitin  Past Medical History  Diagnosis Date  . Depressive disorder, not elsewhere classified   . Anxiety state, unspecified   . Benign neoplasm of colon   . Diverticulitis of colon (without mention of hemorrhage)   . Mixed hyperlipidemia   . Obesity, unspecified   . Unspecified essential hypertension   . Chronic rhinitis   . GERD   . INTERTRIGO, CANDIDAL   . Major neurocognitive disorder due to Alzheimer's disease, probable, without behavioral disturbance     Followed by Dr. Brett Fairy at [facility].   Marland Kitchen PRESSURE ULCER STAGE I     sacral, recurrent   Family History  Problem Relation Age of Onset  . Breast cancer Mother   . Colon cancer Mother    History  Substance Use Topics  . Smoking status: Former Smoker -- 0.20 packs/day for 2 years    Types: Cigarettes  . Smokeless tobacco: Never Used     Comment: smoked 2 cigarettes a week  . Alcohol Use: No    Review of Systems  Constitutional: Negative for fatigue and unexpected weight change.  Respiratory: Negative for cough, shortness of breath and  wheezing.   Cardiovascular: Negative for chest pain, palpitations and leg swelling.  Gastrointestinal: Negative for nausea, abdominal pain and diarrhea.  Musculoskeletal: Positive for arthralgias. Negative for gait problem, joint swelling and myalgias.  Skin: Positive for color change (chronic redness sacral region, no open ulcer at this time).  Neurological: Negative for dizziness, weakness, light-headedness and headaches.  Psychiatric/Behavioral: Negative for dysphoric mood. The patient is not nervous/anxious.   All other systems reviewed and are negative.      Objective:   Physical Exam  BP 120/72  Pulse 74  Temp(Src) 97.7 F (36.5 C) (Oral)  Ht 5\' 1"  (1.549 m)  Wt 154 lb 6.4 oz (70.035 kg)  BMI 29.19 kg/m2  SpO2 95% Wt Readings from Last 3 Encounters:  12/04/13 154 lb 6.4 oz (70.035 kg)  10/08/13 155 lb 12.8 oz (70.67 kg)  06/10/13 153 lb (69.4 kg)   Constitutional: She is overweight, but appears well-developed and well-nourished. No distress. Spouse Dan at side Neck: Normal range of motion. Neck supple. No JVD present. No thyromegaly present.  Cardiovascular: Normal rate, regular rhythm and normal heart sounds.  No murmur heard. No BLE edema. Pulmonary/Chest: Effort normal and breath sounds normal. No respiratory distress. She has no wheezes.  Abdominal: Soft. Bowel sounds are normal. She exhibits no distension. There is no tenderness. no masses Musculoskeletal: L Shoulder: Full range of motion. Neurovascularly intact distally. Good strength with stress of rotator cuff  but causes pain. Positive impingement signs. Neurological: She is alert and oriented to person, but not place, purpose or time. No cranial nerve deficit. Gait slow and aided with RW. Speech fluent but not purposeful - recall not tested. Coordination, balance, strength are intact.  Skin: Skin at upper buttock crack with stage 1 pressure sore - no open skin or ulcers but non blanching redness and "flaking".  remaining is warm and dry. No rash noted.  Psychiatric: She has a normal mood and affect. Her behavior is normal. Judgment and thought content impaired by cognitive impair.    Lab Results  Component Value Date   WBC 8.8 11/03/2012   HGB 14.4 11/03/2012   HCT 42.9 11/03/2012   PLT 164.0 11/03/2012   GLUCOSE 120* 11/03/2012   ALT 8 11/03/2012   AST 13 11/03/2012   NA 137 11/03/2012   K 4.3 11/03/2012   CL 102 11/03/2012   CREATININE 1.0 11/03/2012   BUN 15 11/03/2012   CO2 30 11/03/2012   TSH 1.52 11/03/2012   No results found.     Assessment & Plan:   Problem List Items Addressed This Visit   ANXIETY, CHRONIC     The current medical regimen is effective;  continue present plan and medications.     HYPERTENSION      BP Readings from Last 3 Encounters:  12/04/13 120/72  10/08/13 110/74  06/10/13 133/90   The current medical regimen is effective;  continue present plan and medications.     Major neurocognitive disorder due to Alzheimer's disease, probable, without behavioral disturbance - Primary (Chronic)     Advanced but stable disease Extensive but appropriate support provided by spouse at home -  Follows with neuro for same The current medical regimen is effective;  continue present plan and medications.     PRESSURE ULCER STAGE I     Chronic stage 1 at sacrum Continue desitin and frequent position change to avoid pressure Spouse will monitor as ongoing and call if any change noted      Time spent with pt/family today 30 minutes, greater than 50% time spent counseling patient on dementia, sacral pressure sore, hypertension and medication review. Also review of prior records

## 2013-12-04 NOTE — Patient Instructions (Signed)
It was good to see you today.  We have reviewed your prior records including labs and tests today  Medications reviewed and updated, no changes recommended at this time. Let us know what refills you need and we will send as needed  Please schedule followup in 3-4 months, call sooner if problems.

## 2013-12-04 NOTE — Progress Notes (Signed)
Pre visit review using our clinic review tool, if applicable. No additional management support is needed unless otherwise documented below in the visit note. 

## 2013-12-05 NOTE — Assessment & Plan Note (Signed)
Advanced but stable disease Extensive but appropriate support provided by spouse at home -  Follows with neuro for same The current medical regimen is effective;  continue present plan and medications.

## 2013-12-05 NOTE — Assessment & Plan Note (Signed)
BP Readings from Last 3 Encounters:  12/04/13 120/72  10/08/13 110/74  06/10/13 133/90   The current medical regimen is effective;  continue present plan and medications.

## 2013-12-05 NOTE — Assessment & Plan Note (Signed)
The current medical regimen is effective;  continue present plan and medications.  

## 2013-12-05 NOTE — Assessment & Plan Note (Signed)
Chronic stage 1 at sacrum Continue desitin and frequent position change to avoid pressure Spouse will monitor as ongoing and call if any change noted

## 2013-12-10 ENCOUNTER — Other Ambulatory Visit: Payer: Self-pay

## 2013-12-10 MED ORDER — CLONIDINE HCL 0.1 MG PO TABS: 0.1000 mg | ORAL_TABLET | Freq: Every day | ORAL | Status: DC

## 2013-12-10 MED ORDER — POTASSIUM CHLORIDE CRYS ER 20 MEQ PO TBCR: 20.0000 meq | EXTENDED_RELEASE_TABLET | ORAL | Status: DC

## 2013-12-10 NOTE — Telephone Encounter (Signed)
Husband lmovm requesting refill for potassium and clonidine sent to mail order

## 2013-12-23 ENCOUNTER — Other Ambulatory Visit: Payer: Self-pay | Admitting: *Deleted

## 2013-12-23 NOTE — Telephone Encounter (Signed)
Called pt spoke with husband Linna Hoff) gave him md response. Husband states will talk with wife & if she want to continue will call back for referral.../lmb

## 2013-12-23 NOTE — Telephone Encounter (Signed)
This is hormone (estrogen) replacement -  I do not typically prescribe this medication for my patients I feel it is ok to stop the med, no refill from me If pt/spouse want to remain on the med, I will refer to gyn for eval before continuing the med thanks

## 2013-12-23 NOTE — Telephone Encounter (Signed)
Not sure what this is prescribe for. Is this ok to refill?...[name]

## 2014-02-03 ENCOUNTER — Encounter: Payer: Self-pay | Admitting: Podiatry

## 2014-02-03 ENCOUNTER — Ambulatory Visit (INDEPENDENT_AMBULATORY_CARE_PROVIDER_SITE_OTHER): Payer: Medicare Other | Admitting: Podiatry

## 2014-02-03 ENCOUNTER — Ambulatory Visit (INDEPENDENT_AMBULATORY_CARE_PROVIDER_SITE_OTHER): Payer: Medicare Other

## 2014-02-03 VITALS — Resp 16 | Ht 63.0 in | Wt 150.0 lb

## 2014-02-03 DIAGNOSIS — M722 Plantar fascial fibromatosis: Secondary | ICD-10-CM

## 2014-02-03 DIAGNOSIS — M2040 Other hammer toe(s) (acquired), unspecified foot: Secondary | ICD-10-CM

## 2014-02-03 MED ORDER — TRIAMCINOLONE ACETONIDE 10 MG/ML IJ SUSP
10.0000 mg | Freq: Once | INTRAMUSCULAR | Status: AC
  Administered 2014-02-03: 10 mg

## 2014-02-03 NOTE — Progress Notes (Signed)
   Subjective:    Patient ID: [name], female    DOB: [birth year], 80 y.o.   MRN: [id number]  HPI Comments: Her right heel hurts and her great toe and 2nd toes on her left foot are crossing over. The right foot is the only one that hurts. This has been going on for 8 months. The pain is off and on. The pain is getting worse. It hurts after sitting and laying down. i dont do anything for my feet.  Foot Pain      Review of Systems  All other systems reviewed and are negative.      Objective:   Physical Exam        Assessment & Plan:

## 2014-02-04 NOTE — Progress Notes (Signed)
Subjective:     Patient ID: [name], female   DOB: 02-14-1931, 80 y.o.   MRN: [id number]  Foot Pain   patient presents with husband stating that her right heel has been very sore and that they were concerned about it and that her big toe and her second toe left do crossover. States it's been sore for about 8 months   Review of Systems  All other systems reviewed and are negative.      Objective:   Physical Exam  Nursing note and vitals reviewed. Constitutional: She is oriented to person, place, and time.  Cardiovascular: Intact distal pulses.   Musculoskeletal: Normal range of motion.  Neurological: She is oriented to person, place, and time.  Skin: Skin is dry.   neurovascular status intact with muscle strength reduced and range of motion reduce to the subtalar midtarsal joint. Patient is partially ambulatory but is unable to walk comfortably and has significant discomfort in the plantar right heel at the insertion of the tendon into the calcaneus. Does have for foot structural malalignment left with the left hallux under the second toe and nail disease noted secondary to pressure     Assessment:     Plantar fasciitis right and structural abnormalities of the left foot    Plan:     H&P performed and x-ray reviewed. Injected the right plantar fascia 3 mg Kenalog 5 mg Xylocaine and discussed that we will not do anything for the left foot as at her age I do not recommend surgery

## 2014-02-10 ENCOUNTER — Other Ambulatory Visit: Payer: Self-pay

## 2014-02-10 MED ORDER — CITALOPRAM HYDROBROMIDE 20 MG PO TABS: 20.0000 mg | ORAL_TABLET | Freq: Every day | ORAL | Status: DC

## 2014-02-15 ENCOUNTER — Other Ambulatory Visit: Payer: Self-pay

## 2014-02-15 MED ORDER — PANTOPRAZOLE SODIUM 40 MG PO TBEC: 40.0000 mg | DELAYED_RELEASE_TABLET | Freq: Every day | ORAL | Status: DC

## 2014-02-15 MED ORDER — CITALOPRAM HYDROBROMIDE 20 MG PO TABS: 20.0000 mg | ORAL_TABLET | Freq: Every day | ORAL | Status: DC

## 2014-03-08 ENCOUNTER — Ambulatory Visit: Payer: Medicare Other | Admitting: Internal Medicine

## 2014-04-07 ENCOUNTER — Encounter: Payer: Self-pay | Admitting: Gastroenterology

## 2014-04-30 ENCOUNTER — Ambulatory Visit (INDEPENDENT_AMBULATORY_CARE_PROVIDER_SITE_OTHER)
Admission: RE | Admit: 2014-04-30 | Discharge: 2014-04-30 | Disposition: A | Discharge status: Home / Self Care | Payer: Medicare Other | Source: Ambulatory Visit | Attending: Internal Medicine | Admitting: Internal Medicine

## 2014-04-30 ENCOUNTER — Encounter: Payer: Self-pay | Admitting: Internal Medicine

## 2014-04-30 ENCOUNTER — Ambulatory Visit (INDEPENDENT_AMBULATORY_CARE_PROVIDER_SITE_OTHER): Payer: Medicare Other | Admitting: Internal Medicine

## 2014-04-30 VITALS — BP 120/76 | HR 83 | Temp 97.0°F | Ht 61.0 in | Wt 158.0 lb

## 2014-04-30 DIAGNOSIS — R05 Cough: Secondary | ICD-10-CM

## 2014-04-30 DIAGNOSIS — I10 Essential (primary) hypertension: Secondary | ICD-10-CM

## 2014-04-30 DIAGNOSIS — R058 Other specified cough: Secondary | ICD-10-CM

## 2014-04-30 DIAGNOSIS — K219 Gastro-esophageal reflux disease without esophagitis: Secondary | ICD-10-CM

## 2014-04-30 DIAGNOSIS — L8991 Pressure ulcer of unspecified site, stage 1: Secondary | ICD-10-CM

## 2014-04-30 DIAGNOSIS — J310 Chronic rhinitis: Secondary | ICD-10-CM

## 2014-04-30 DIAGNOSIS — Z23 Encounter for immunization: Secondary | ICD-10-CM

## 2014-04-30 MED ORDER — AZITHROMYCIN 250 MG PO TABS: ORAL_TABLET | ORAL | Status: DC

## 2014-04-30 MED ORDER — PREDNISONE 10 MG PO TABS: ORAL_TABLET | ORAL | Status: DC

## 2014-04-30 NOTE — Progress Notes (Signed)
Subjective:    Patient ID: [name], female    DOB: 1930-08-18    MRN: [id number]  Brief patient profile:  51   yowf with minimal smoking hx with morbid obesity and  history of asthmatic bronchitis, hypertension, osteoarthritis and mild dementia.     History of Present Illness  11/03/2012 Follow up  Returns for routine follow up  She is accompanied by her husband.  At her baseline, with no chest pain, increased dyspnea or edema.  Memory loss has stabilized on Exelon and Namenda, continues to see Dr. Westley Hummer at Neuro.  Had URI 2/27, tx w/ zpack with resolution We discussed upcoming screenings such as mammogram and colon -she /husband decline at this time-do not want to do these in future .  rec ceftin for uti/ R to cipro, rx x 7 days    04/30/2014 acute  ov/Wert re: cough/ no med calendar / husband" using his own list now" Chief Complaint  Patient presents with  . Follow-up    C/o constant, prod cough-white phlem, slight wheezing/chest tx   abrupt onset x 3 weeks worse before supper and also bedtime   Sleeping ok p gets to sleep without nocturnal  or early am exacerbation  of respiratory  C/o's including cough  or need for noct saba. Also denies any obvious fluctuation of symptoms with weather or environmental changes or other aggravating or alleviating factors except as outlined above   Current Medications, Allergies, Past Medical History, Past Surgical History, Family History, and Social History were reviewed in [employer] record.  ROS  The following are not active complaints unless bolded sore throat, dysphagia, dental problems, itching, sneezing,  nasal congestion or excess/ purulent secretions, ear ache,   fever, chills, sweats, unintended wt loss, pleuritic or exertional cp, hemoptysis,  orthopnea pnd or leg swelling, presyncope, palpitations, heartburn, abdominal pain, anorexia, nausea, vomiting, diarrhea  or change in bowel or urinary habits,  change in stools or urine, dysuria,hematuria,  rash, arthralgias, visual complaints, headache, numbness weakness or ataxia or problems with walking or coordination,  change in mood/affect or memory.      Past Medical History:  COUGH (ICD-786.2)  DEPRESSION (ICD-311)  ANXIETY, CHRONIC (ICD-300.00)  COLONIC POLYPS (ICD-211.3)  DIVERTICULITIS, COLON (ICD-562.11)  - colonoscopy 03/21/2004  HYPERLIPIDEMIA, MIXED (ICD-272.2)  BARRETT'S ESOPHAGUS, HX OF (ICD-V12.79).........................[facility]  - EGD 03/21/2004  OBESITY (ICD-278.00)  - Target wt = < 158 for BMI < 30  UTI'S, CHRONIC (ICD-599.0)............................................................. Jasmine December / [facility] OSTEOARTHRITIS (ICD-715.90)  ASTHMATIC BRONCHITIS, ACUTE (ICD-466.0)  DEMENTIA, MILD (ICD-294.8) >followed by [facility]  HYPERTENSION (ICD-401.9)  RHINITIS (ICD-472.0)  Sacral Decubitus -wound care > referred to [facility] 03/21/2011    COMPLEX MED REGIMEN  ---.Meds reviewed with pt education and computerized med calendar completed/adjusted-08/19/2008, June 26, 2010 , 02/02/11           Objective:   Physical Exam  GEN: A/Ox2 ;  NAD , elderly and frail  Walking rolling walker with brakes   Wt 151 08/04/12 >155 11/03/2012 > 151 12/22/2012 > 04/30/2014 158    HEENT:  /AT,  EACs-clear, TMs-wnl, NOSE-clear drainage , THROAT-clear, no lesions, no postnasal drip or exudate noted - oropharynx is pristine   NECK:  Supple w/ fair ROM; no JVD; normal carotid impulses w/o bruits; no thyromegaly or nodules palpated; no lymphadenopathy.  RESP  Clear  P & A; w/o, wheezes/ rales/ or rhonchi.no accessory muscle use, no dullness to percussion  CARD:  RRR, no m/r/g  ,  no peripheral edema, pulses intact, no cyanosis or clubbing.  GI:   Soft & nt; nml bowel sounds; no organomegaly or masses detected.no guarding or rebound, no cvat  Musco: Warm bil, no deformities or joint swelling noted.   Neuro:  alert, no focal deficits noted.  Oriented to person and place  Skin: Warm, no rash .         CXR  04/30/2014 : No acute cardiopulmonary abnormality seen      Assessment & Plan:

## 2014-04-30 NOTE — Patient Instructions (Signed)
zpak Prednisone 10 mg take  4 each am x 2 days,   2 each am x 2 days,  1 each am x 2 days and stop nasonex at bedtime as a maintance  For drainage take chlortrimeton (chlorpheniramine) 4 mg every 4 hours available over the counter (may cause drowsiness)   See Tammy in 2 weeks if not better with your list of medications to compare to ours

## 2014-05-01 NOTE — Assessment & Plan Note (Signed)
Classic Upper airway cough syndrome, so named because it's frequently impossible to sort out how much is  CR/sinusitis with freq throat clearing (which can be related to primary GERD)   vs  causing  secondary (" extra esophageal")  GERD from wide swings in gastric pressure that occur with throat clearing, often  promoting self use of mint and menthol lozenges that reduce the lower esophageal sphincter tone and exacerbate the problem further in a cyclical fashion.   These are the same pts (now being labeled as having "irritable larynx syndrome" by some cough centers) who not infrequently have a history of having failed to tolerate ace inhibitors,  dry powder inhalers or biphosphonates or report having atypical reflux symptoms that don't respond to standard doses of PPI , and are easily confused as having aecopd or asthma flares by even experienced allergists/ pulmonologists.   For now rx as rhinitis/ gerd and f/u prn

## 2014-05-01 NOTE — Assessment & Plan Note (Signed)
Resolved,  F/u primary care prn

## 2014-05-01 NOTE — Assessment & Plan Note (Signed)
Referred to Int medicine to establish 05/2014

## 2014-05-01 NOTE — Assessment & Plan Note (Addendum)
Mild flare with cough at cough with  more symptoms than object findings support but will tread acutely with zpak and prednisone then return to see Tammy NP with accurate med list to regroup and compare to our med calendar/ MAR and go from threre  Did explain again that nasonex is qhs and not prn    Each maintenance medication was reviewed in detail including most importantly the difference between maintenance and as needed and under what circumstances the prns are to be used.  Please see instructions for details which were reviewed in writing and the patient given a copy.

## 2014-05-01 NOTE — Assessment & Plan Note (Signed)
Explained the natural history of uri and why it's necessary in patients at risk to treat GERD aggressively - at least  short term -   to reduce risk of evolving cyclical cough initially  triggered by epithelial injury and a heightened sensitivty to the effects of any upper airway irritants,  most importantly acid - related.    That is, the more sensitive the epithelium becomes once it is damaged by the virus, the more the ensuing irritability> the more the cough, the more the secondary reflux (especially in those prone to reflux) the more the irritation of the sensitive mucosa and so on in a  Classic cyclical pattern.

## 2014-05-04 NOTE — Progress Notes (Signed)
Quick Note:  Spoke with pt and notified of results per Dr. Wert. Pt verbalized understanding and denied any questions.  ______ 

## 2014-05-14 ENCOUNTER — Other Ambulatory Visit: Payer: Self-pay | Admitting: Internal Medicine

## 2014-05-20 ENCOUNTER — Other Ambulatory Visit: Payer: Self-pay

## 2014-05-20 DIAGNOSIS — F0390 Unspecified dementia without behavioral disturbance: Secondary | ICD-10-CM

## 2014-05-20 MED ORDER — DONEPEZIL HCL 10 MG PO TABS: 10.0000 mg | ORAL_TABLET | Freq: Every day | ORAL | Status: DC

## 2014-05-25 ENCOUNTER — Ambulatory Visit: Payer: Medicare Other | Admitting: Internal Medicine

## 2014-06-16 ENCOUNTER — Ambulatory Visit (INDEPENDENT_AMBULATORY_CARE_PROVIDER_SITE_OTHER): Payer: Medicare Other | Admitting: Neurology

## 2014-06-16 ENCOUNTER — Encounter: Payer: Self-pay | Admitting: Neurology

## 2014-06-16 VITALS — BP 148/79 | HR 74 | Resp 14 | Ht 63.0 in | Wt 158.8 lb

## 2014-06-16 DIAGNOSIS — G309 Alzheimer's disease, unspecified: Secondary | ICD-10-CM

## 2014-06-16 DIAGNOSIS — F0280 Dementia in other diseases classified elsewhere without behavioral disturbance: Secondary | ICD-10-CM

## 2014-06-16 DIAGNOSIS — F22 Delusional disorders: Secondary | ICD-10-CM

## 2014-06-16 MED ORDER — MEMANTINE HCL-DONEPEZIL HCL ER 28-10 MG PO CP24: 28.0000 mg | ORAL_CAPSULE | ORAL | Status: DC

## 2014-06-16 MED ORDER — MEMANTINE HCL ER 28 MG PO CP24: ORAL_CAPSULE | ORAL | Status: DC

## 2014-06-16 NOTE — Progress Notes (Addendum)
[facility]  PATIENT: JONEISHA MILES DOB: [birth year]   REASON FOR VISIT: follow up HISTORY FROM: patient  HISTORY OF PRESENT ILLNESS:   Patient is meanwhile 80 years old and has declined in her cognitive status further and further over the last 4 years,  MMSE 06-16-14 was 8 points,  The patient is drowsy - day and night , her husband is her main care taker and is exhausted. They also have financial restraints.  Her husband has insomnia and is now taking [place] at age 36- he never tried melatonin .  Her Namenda XR 28 mg is 148 USD. I will arrange for company support,  patient assistance. The patient is unable to answer questions correctly .  She believes she is in [place], [place]. She did for 40 years, than moved to [place] and then to [place], [place].  Her husband was in the army.     05-11-11, Ms. [name], 80 yrs old returns for follow-up. She was last seen June 21/2010. Pt reports memory a little worse. Increasing difficulties with word finding. Continues to be independent in ADL's. No longer cooks, not driven in 6 yrs.  She is currently  on Aricept and Namenda. Her MMT today shows a continued decline  from 23 to 15 points. Her AFT is reduced to 4 from 7. Pt and husband deny any behavior issues, appetite is stable, sleeping well. Denies hallucinations. No new neurolpgic complaints. See ROS. Rv for progressive dementia, gait disorder, poverty of speech. uses walker. MMSE is 14 /30 today, AFT only 2 names, cannot abstract- the apple doesn't fall far from the tree- "  what does this mean - "nobody gets hurt". has hallucinations, believes there is a  woman in her husbands bed. insistent. He noted  nightly talking and sometimes whole conversations , sometimes mumbling. her cognitive decline is evident as is her physical decline - she denies changes in eating habits and her husband mentioned her apetite increased 14 days ago.  He is interested in another dementia  medication and asked explicitly about Alzheimer's research.  9-4 -13 Rv for dementia follow up, todays MMSe is 10 out of 30 points.  she is off aricept and now on exelon patches ,  and she continued on namenda  acc. to my records at [facility], but husband was unsure.  Long medication list.06-09-13 (LL):  Patient is here for dementia follow up, she needs assistance with all IADL's and ADLs, has poverty of speech, cannot contemplate abstract expressions or recall past a minute or so.   Husband cannot afford Namenda anymore, and would like to switch from Exelon patches to Donepizil to save money.  MMSE 12/30, AFT only 1, Clock drawing 0/4. Uses rolling walker at all times, denies falls.  FAST score 6e.  REVIEW OF SYSTEMS: Full 14 system review of systems performed and notable only for: memory loss, confusion. MMSE 06-16-14 was 8 points,  Namenda XR 28 mg is 148 USD. I will arrange for company support,  patient  assistance.    The patient is unable to answer questions correctly .    ALLERGIES: Allergies  Allergen Reactions  . Penicillins     REACTION: adolesent    HOME MEDICATIONS: Outpatient Prescriptions Prior to Visit  Medication Sig Dispense Refill  . albuterol (PROVENTIL HFA;VENTOLIN HFA) 108 (90 BASE) MCG/ACT inhaler Inhale 2 puffs into the lungs every 4 (four) hours as needed for wheezing or shortness of breath.    . ALPRAZolam (XANAX) 0.25 MG  tablet Take 1 tablet (0.25 mg total) by mouth every 8 (eight) hours as needed. 60 tablet 2  . aspirin 325 MG EC tablet Take 325 mg by mouth daily.      . cloNIDine (CATAPRES) 0.1 MG tablet TAKE ONE TABLET BY MOUTH ONCE DAILY 90 tablet 1  . donepezil (ARICEPT) 10 MG tablet Take 1 tablet (10 mg total) by mouth daily. 30 tablet 0  . famotidine (PEPCID) 20 MG tablet Take 20 mg by mouth at bedtime.      Marland Kitchen ibuprofen (ADVIL,MOTRIN) 200 MG tablet 3 tabs with meal twice daily as needed    . pantoprazole (PROTONIX) 40 MG tablet Take 1 tablet (40 mg total) by  mouth daily before breakfast. 90 tablet 3  . potassium chloride SA (K-DUR,KLOR-CON) 20 MEQ tablet Take 1 tablet (20 mEq total) by mouth every morning. 90 tablet 1  . triamterene-hydrochlorothiazide (MAXZIDE) 75-50 MG per tablet Take 1 tablet by mouth daily as needed.      . citalopram (CELEXA) 20 MG tablet Take 1 tablet (20 mg total) by mouth daily. 90 tablet 3  . dexbrompheniramine-pseudoephedrine (DRIXORAL) 6-120 MG per tablet Take 1 tablet by mouth daily as needed (for drippy nose).    Marland Kitchen HYDROcodone-acetaminophen (VICODIN) 5-500 MG per tablet Take 1 tablet by mouth every 6 (six) hours as needed (for severe pain).    . hyoscyamine (LEVSIN, ANASPAZ) 0.125 MG tablet 1-2 every 6 hours as needed for abdominal cramps    . Memantine HCl ER (NAMENDA XR) 28 MG CP24 Take 28 mg by mouth daily. 30 capsule 5  . mometasone (NASONEX) 50 MCG/ACT nasal spray Place 1-2 sprays into the nose as needed.     . predniSONE (DELTASONE) 10 MG tablet Take  4 each am x 2 days,   2 each am x 2 days,  1 each am x 2 days and stop 14 tablet 0  . promethazine (PHENERGAN) 25 MG tablet Take 25 mg by mouth every 6 (six) hours as needed.      . valACYclovir (VALTREX) 1000 MG tablet Take 1 tablet (1,000 mg total) by mouth daily as needed.    Marland Kitchen azithromycin (ZITHROMAX) 250 MG tablet Take 2 on day one then 1 daily x 4 days 6 tablet 0   No facility-administered medications prior to visit.    PAST MEDICAL HISTORY: Past Medical History  Diagnosis Date  . Depressive disorder, not elsewhere classified   . Anxiety state, unspecified   . Benign neoplasm of colon   . Diverticulitis of colon (without mention of hemorrhage)   . Mixed hyperlipidemia   . Obesity, unspecified   . Unspecified essential hypertension   . Chronic rhinitis   . GERD   . INTERTRIGO, CANDIDAL   . Major neurocognitive disorder due to Alzheimer's disease, probable, without behavioral disturbance     Followed by Dr. Brett Fairy at [facility].   Marland Kitchen PRESSURE  ULCER STAGE I     sacral, recurrent    PAST SURGICAL HISTORY: Past Surgical History  Procedure Laterality Date  . Cholecystectomy    . Total hip arthroplasty    . Vesicovaginal fistula closure w/ tah    . Rotator cuff repair      FAMILY HISTORY: Family History  Problem Relation Age of Onset  . Breast cancer Mother   . Colon cancer Mother     SOCIAL HISTORY: History   Social History  . Marital Status: Married    Spouse Name: Quillian Quince    Number of  Children: 6  . Years of Education: 12   Occupational History  . Housewife    Social History Main Topics  . Smoking status: Former Smoker -- 0.20 packs/day for 2 years    Types: Cigarettes    Quit date: 07/16/1958  . Smokeless tobacco: Never Used     Comment: stopped 67 years ago  . Alcohol Use: No  . Drug Use: No  . Sexual Activity: Not on file   Other Topics Concern  . Not on file   Social History Narrative   Patient is married Quillian Quince) and lives with her husband.   Patient is a homemaker.   Patient has 6 children.   Patient is right-handed.   Patient has a high school education.   Patient does not drink any caffeine.     PHYSICAL EXAM  Filed Vitals:   06/16/14 1509  BP: 148/79  Pulse: 74  Resp: 14  Height: 5\' 3"  (1.6 m)  Weight: 158 lb 12.8 oz (72.031 kg)   Body mass index is 28.14 kg/(m^2).  Physical Exam  General: seated, in no evident distress Head: head  normocephalic and atraumatic. Neck: supple with no carotid or supraclavicular bruits. Respiratory: LCA Cardiovascular: regular rate and rhythm, no murmurs Skin: no edema   Neurologic Exam  Mental Status: Awake and  alert.  MMSE today score 8/30  (last visit 03-19-12  MMSE she scored 10 /30, AFT 2).  Animal Fluency in 1 minute is 1 only.  Clock drawing is 0/4. She cannot longer hold the pencil to draw or write.  Mood and affect ; she is aloof, drowsy and not participating, appears to day dream. she appears lost, not distressed. Cranial Nerves:   Pupils equal, briskly reactive to light.   Extraocular movements full without nystagmus.  Visual fields full to confrontation.   Hearing is severely impaired, she couldn't hear vibration by bone/air conduction. Facial sensation intact.  Face with long standing droop on the left, tongue, palate move normally and symmetrically. shoulder droop in the left, retracted shoulder on the right with failure of relaxation.    Motor:  atrophy of proximal muscles, deconditioning.  Sensory: no numnbess  Coordination: Rapid alternating movements are slow in all extremities.  Finger-to-nose performed accurately bilaterally. Gait and Station: Arises from chair without difficulty. Stance is wide based. Ambulated with rolling walker, no difficulty with turns.  Reflexes: Diminished and symmetric.  Toes downgoing.  DIAGNOSTIC DATA (LABS, IMAGING, TESTING) - I reviewed patient records, labs, notes, testing and imaging myself where available.  ASSESSMENT AND PLAN Mrs. Xochilth Standish is an 81 year-old Caucasian, married, right handed  female with PMH significant for Major Neurocognitive Disorder due to probable Alzheimer's disease-  without behavioral disturbance.   Dementia is  severe.  She requires help with all ADL's.  She is unaware of her halluciantions, reported by her spouse.    Cost of Exelon patch and Namenda have become too much. Patient assistance  program.  They would like to start Donepizil because it is more affordable (generic).  PLAN: Aricept 10 mg daily to continue and 28 mg namenda- changed  To  Daily Namzaric - free samples.  Follow up in 6 month with NP alternating with me  Vst time 45 min.  EEG with next RV    06/16/2014, 4:02 PM [facility] [address] [place], [place] [address] [phone]

## 2014-06-16 NOTE — Addendum Note (Signed)
Addended by: Larey Seat on: 06/16/2014 04:03 PM   Modules accepted: Orders, Medications

## 2014-06-20 ENCOUNTER — Other Ambulatory Visit: Payer: Self-pay | Admitting: Internal Medicine

## 2014-06-22 DIAGNOSIS — J069 Acute upper respiratory infection, unspecified: Secondary | ICD-10-CM | POA: Insufficient documentation

## 2014-06-22 DIAGNOSIS — N390 Urinary tract infection, site not specified: Secondary | ICD-10-CM | POA: Insufficient documentation

## 2014-06-23 ENCOUNTER — Ambulatory Visit: Payer: Medicare Other | Admitting: Medical

## 2014-06-29 ENCOUNTER — Ambulatory Visit: Payer: Medicare Other | Admitting: Family

## 2014-07-02 ENCOUNTER — Encounter (HOSPITAL_BASED_OUTPATIENT_CLINIC_OR_DEPARTMENT_OTHER): Payer: Self-pay | Admitting: *Deleted

## 2014-07-02 ENCOUNTER — Ambulatory Visit (INDEPENDENT_AMBULATORY_CARE_PROVIDER_SITE_OTHER): Payer: Medicare Other | Admitting: Family

## 2014-07-02 ENCOUNTER — Emergency Department (HOSPITAL_BASED_OUTPATIENT_CLINIC_OR_DEPARTMENT_OTHER): Payer: Medicare Other

## 2014-07-02 ENCOUNTER — Inpatient Hospital Stay (HOSPITAL_BASED_OUTPATIENT_CLINIC_OR_DEPARTMENT_OTHER)
Admission: EM | Admit: 2014-07-02 | Discharge: 2014-07-06 | DRG: 194 | Disposition: A | Discharge status: Skilled Nursing Facility | Payer: Medicare Other | Attending: Internal Medicine | Admitting: Internal Medicine

## 2014-07-02 ENCOUNTER — Encounter: Payer: Self-pay | Admitting: Family

## 2014-07-02 VITALS — BP 84/50 | HR 94 | Temp 98.0°F | Resp 18 | Ht 59.5 in | Wt 156.6 lb

## 2014-07-02 DIAGNOSIS — R05 Cough: Secondary | ICD-10-CM | POA: Diagnosis present

## 2014-07-02 DIAGNOSIS — E669 Obesity, unspecified: Secondary | ICD-10-CM | POA: Diagnosis present

## 2014-07-02 DIAGNOSIS — Z6831 Body mass index (BMI) 31.0-31.9, adult: Secondary | ICD-10-CM

## 2014-07-02 DIAGNOSIS — I959 Hypotension, unspecified: Secondary | ICD-10-CM | POA: Diagnosis present

## 2014-07-02 DIAGNOSIS — Z96643 Presence of artificial hip joint, bilateral: Secondary | ICD-10-CM | POA: Diagnosis present

## 2014-07-02 DIAGNOSIS — F329 Major depressive disorder, single episode, unspecified: Secondary | ICD-10-CM | POA: Diagnosis present

## 2014-07-02 DIAGNOSIS — M1990 Unspecified osteoarthritis, unspecified site: Secondary | ICD-10-CM | POA: Diagnosis present

## 2014-07-02 DIAGNOSIS — I5032 Chronic diastolic (congestive) heart failure: Secondary | ICD-10-CM | POA: Diagnosis present

## 2014-07-02 DIAGNOSIS — Z79899 Other long term (current) drug therapy: Secondary | ICD-10-CM

## 2014-07-02 DIAGNOSIS — K5900 Constipation, unspecified: Secondary | ICD-10-CM

## 2014-07-02 DIAGNOSIS — F0281 Dementia in other diseases classified elsewhere with behavioral disturbance: Secondary | ICD-10-CM | POA: Diagnosis present

## 2014-07-02 DIAGNOSIS — J189 Pneumonia, unspecified organism: Principal | ICD-10-CM | POA: Diagnosis present

## 2014-07-02 DIAGNOSIS — K219 Gastro-esophageal reflux disease without esophagitis: Secondary | ICD-10-CM | POA: Diagnosis present

## 2014-07-02 DIAGNOSIS — Z87891 Personal history of nicotine dependence: Secondary | ICD-10-CM | POA: Diagnosis not present

## 2014-07-02 DIAGNOSIS — R059 Cough, unspecified: Secondary | ICD-10-CM

## 2014-07-02 DIAGNOSIS — F0280 Dementia in other diseases classified elsewhere without behavioral disturbance: Secondary | ICD-10-CM | POA: Diagnosis present

## 2014-07-02 DIAGNOSIS — I10 Essential (primary) hypertension: Secondary | ICD-10-CM | POA: Diagnosis present

## 2014-07-02 DIAGNOSIS — F02A18 Dementia in other diseases classified elsewhere, mild, with other behavioral disturbance: Secondary | ICD-10-CM | POA: Diagnosis present

## 2014-07-02 DIAGNOSIS — G309 Alzheimer's disease, unspecified: Secondary | ICD-10-CM | POA: Diagnosis present

## 2014-07-02 DIAGNOSIS — F419 Anxiety disorder, unspecified: Secondary | ICD-10-CM | POA: Diagnosis present

## 2014-07-02 DIAGNOSIS — N390 Urinary tract infection, site not specified: Secondary | ICD-10-CM

## 2014-07-02 DIAGNOSIS — Z7982 Long term (current) use of aspirin: Secondary | ICD-10-CM

## 2014-07-02 DIAGNOSIS — L89159 Pressure ulcer of sacral region, unspecified stage: Secondary | ICD-10-CM

## 2014-07-02 DIAGNOSIS — R195 Other fecal abnormalities: Secondary | ICD-10-CM

## 2014-07-02 DIAGNOSIS — Z66 Do not resuscitate: Secondary | ICD-10-CM | POA: Diagnosis present

## 2014-07-02 DIAGNOSIS — E782 Mixed hyperlipidemia: Secondary | ICD-10-CM | POA: Diagnosis present

## 2014-07-02 DIAGNOSIS — K5909 Other constipation: Secondary | ICD-10-CM

## 2014-07-02 DIAGNOSIS — R058 Other specified cough: Secondary | ICD-10-CM | POA: Diagnosis present

## 2014-07-02 LAB — URINALYSIS, ROUTINE W REFLEX MICROSCOPIC
BILIRUBIN URINE: NEGATIVE
GLUCOSE, UA: NEGATIVE mg/dL
Ketones, ur: NEGATIVE mg/dL
Nitrite: NEGATIVE
Protein, ur: 30 mg/dL — AB
SPECIFIC GRAVITY, URINE: 1.02 (ref 1.005–1.030)
UROBILINOGEN UA: 0.2 mg/dL (ref 0.0–1.0)
pH: 5 (ref 5.0–8.0)

## 2014-07-02 LAB — COMPREHENSIVE METABOLIC PANEL
ALT: 5 U/L (ref 0–35)
AST: 15 U/L (ref 0–37)
Albumin: 3.4 g/dL — ABNORMAL LOW (ref 3.5–5.2)
Alkaline Phosphatase: 66 U/L (ref 39–117)
Anion gap: 13 (ref 5–15)
BUN: 19 mg/dL (ref 6–23)
CO2: 27 mEq/L (ref 19–32)
Calcium: 9.4 mg/dL (ref 8.4–10.5)
Chloride: 104 mEq/L (ref 96–112)
Creatinine, Ser: 1.3 mg/dL — ABNORMAL HIGH (ref 0.50–1.10)
GFR calc non Af Amer: 37 mL/min — ABNORMAL LOW (ref >90)
GFR, EST AFRICAN AMERICAN: 43 mL/min — AB (ref >90)
GLUCOSE: 129 mg/dL — AB (ref 70–99)
POTASSIUM: 4.1 meq/L (ref 3.7–5.3)
Sodium: 144 mEq/L (ref 137–147)
TOTAL PROTEIN: 7.4 g/dL (ref 6.0–8.3)
Total Bilirubin: 0.3 mg/dL (ref 0.3–1.2)

## 2014-07-02 LAB — URINE MICROSCOPIC-ADD ON

## 2014-07-02 LAB — CBC WITH DIFFERENTIAL/PLATELET
BASOS ABS: 0 10*3/uL (ref 0.0–0.1)
Basophils Relative: 1 % (ref 0–1)
Eosinophils Absolute: 0.5 10*3/uL (ref 0.0–0.7)
Eosinophils Relative: 6 % — ABNORMAL HIGH (ref 0–5)
HCT: 43.9 % (ref 36.0–46.0)
Hemoglobin: 14.5 g/dL (ref 12.0–15.0)
LYMPHS ABS: 1.8 10*3/uL (ref 0.7–4.0)
Lymphocytes Relative: 20 % (ref 12–46)
MCH: 32.6 pg (ref 26.0–34.0)
MCHC: 33 g/dL (ref 30.0–36.0)
MCV: 98.7 fL (ref 78.0–100.0)
Monocytes Absolute: 0.8 10*3/uL (ref 0.1–1.0)
Monocytes Relative: 9 % (ref 3–12)
Neutro Abs: 5.5 10*3/uL (ref 1.7–7.7)
Neutrophils Relative %: 64 % (ref 43–77)
Platelets: 175 10*3/uL (ref 150–400)
RBC: 4.45 MIL/uL (ref 3.87–5.11)
RDW: 14.1 % (ref 11.5–15.5)
WBC: 8.6 10*3/uL (ref 4.0–10.5)

## 2014-07-02 LAB — PRO B NATRIURETIC PEPTIDE: PRO B NATRI PEPTIDE: 470 pg/mL — AB (ref 0–450)

## 2014-07-02 LAB — POCT HEMOGLOBIN: HEMOGLOBIN: 15 g/dL (ref 12.2–16.2)

## 2014-07-02 LAB — I-STAT CG4 LACTIC ACID, ED: LACTIC ACID, VENOUS: 1.35 mmol/L (ref 0.5–2.2)

## 2014-07-02 MED ORDER — LEVOFLOXACIN IN D5W 750 MG/150ML IV SOLN: 750.0000 mg | INTRAVENOUS | Status: DC

## 2014-07-02 MED ORDER — PANTOPRAZOLE SODIUM 40 MG PO TBEC
40.0000 mg | DELAYED_RELEASE_TABLET | Freq: Every day | ORAL | Status: DC
  Administered 2014-07-03 – 2014-07-06 (×5): 40 mg via ORAL
  Filled 2014-07-02 (×4): qty 1

## 2014-07-02 MED ORDER — CITALOPRAM HYDROBROMIDE 20 MG PO TABS
20.0000 mg | ORAL_TABLET | Freq: Every day | ORAL | Status: DC
  Administered 2014-07-03 – 2014-07-06 (×4): 20 mg via ORAL
  Filled 2014-07-02 (×4): qty 1

## 2014-07-02 MED ORDER — LEVOFLOXACIN IN D5W 500 MG/100ML IV SOLN: 500.0000 mg | INTRAVENOUS | Status: DC

## 2014-07-02 MED ORDER — FAMOTIDINE 20 MG PO TABS
20.0000 mg | ORAL_TABLET | Freq: Every day | ORAL | Status: DC
  Administered 2014-07-02 – 2014-07-05 (×4): 20 mg via ORAL
  Filled 2014-07-02 (×5): qty 1

## 2014-07-02 MED ORDER — OXYBUTYNIN CHLORIDE 5 MG PO TABS
5.0000 mg | ORAL_TABLET | Freq: Three times a day (TID) | ORAL | Status: DC
  Administered 2014-07-02 – 2014-07-06 (×12): 5 mg via ORAL
  Filled 2014-07-02 (×13): qty 1

## 2014-07-02 MED ORDER — CLONIDINE HCL 0.1 MG PO TABS
0.1000 mg | ORAL_TABLET | Freq: Every day | ORAL | Status: DC
  Administered 2014-07-03 – 2014-07-06 (×4): 0.1 mg via ORAL
  Filled 2014-07-02 (×4): qty 1

## 2014-07-02 MED ORDER — ASPIRIN EC 325 MG PO TBEC
325.0000 mg | DELAYED_RELEASE_TABLET | Freq: Every day | ORAL | Status: DC
  Administered 2014-07-03 – 2014-07-06 (×4): 325 mg via ORAL
  Filled 2014-07-02 (×4): qty 1

## 2014-07-02 MED ORDER — LEVOFLOXACIN IN D5W 750 MG/150ML IV SOLN
750.0000 mg | Freq: Once | INTRAVENOUS | Status: AC
  Administered 2014-07-02: 750 mg via INTRAVENOUS
  Filled 2014-07-02: qty 150

## 2014-07-02 MED ORDER — HEPARIN SODIUM (PORCINE) 5000 UNIT/ML IJ SOLN
5000.0000 [IU] | Freq: Three times a day (TID) | INTRAMUSCULAR | Status: DC
  Administered 2014-07-02 – 2014-07-06 (×12): 5000 [IU] via SUBCUTANEOUS
  Filled 2014-07-02 (×16): qty 1

## 2014-07-02 MED ORDER — ALBUTEROL SULFATE (2.5 MG/3ML) 0.083% IN NEBU: 3.0000 mL | INHALATION_SOLUTION | RESPIRATORY_TRACT | Status: DC | PRN

## 2014-07-02 MED ORDER — MEMANTINE HCL ER 28 MG PO CP24
1.0000 | ORAL_CAPSULE | Freq: Every day | ORAL | Status: DC
  Administered 2014-07-03 – 2014-07-06 (×4): 28 mg via ORAL
  Filled 2014-07-02 (×4): qty 28

## 2014-07-02 MED ORDER — ALPRAZOLAM 0.25 MG PO TABS
0.2500 mg | ORAL_TABLET | Freq: Every evening | ORAL | Status: DC | PRN
  Administered 2014-07-02: 0.25 mg via ORAL
  Filled 2014-07-02: qty 1

## 2014-07-02 MED ORDER — DONEPEZIL HCL 10 MG PO TABS
10.0000 mg | ORAL_TABLET | Freq: Every day | ORAL | Status: DC
  Administered 2014-07-03 – 2014-07-06 (×4): 10 mg via ORAL
  Filled 2014-07-02 (×4): qty 1

## 2014-07-02 MED ORDER — SODIUM CHLORIDE 0.9 % IV BOLUS (SEPSIS)
500.0000 mL | Freq: Once | INTRAVENOUS | Status: AC
  Administered 2014-07-02: 500 mL via INTRAVENOUS

## 2014-07-02 MED ORDER — FLUTICASONE PROPIONATE 50 MCG/ACT NA SUSP
2.0000 | Freq: Every day | NASAL | Status: DC
  Administered 2014-07-03 – 2014-07-06 (×4): 2 via NASAL
  Filled 2014-07-02: qty 16

## 2014-07-02 NOTE — Assessment & Plan Note (Signed)
Suspect dehydration-  BP 84/50 on repeat.  Hgb 15 on POC hemocue.  + heme positive stool.  Bibasilar crackles.  Rule out sepsis (? PNA, ?UTI) will refer to ED for further evaluation and IV fluids.

## 2014-07-02 NOTE — ED Notes (Signed)
Peri Care completed

## 2014-07-02 NOTE — ED Notes (Signed)
Report Attempted

## 2014-07-02 NOTE — ED Notes (Signed)
Pt given Cranberry Juice and Repositioned in Bed

## 2014-07-02 NOTE — Progress Notes (Signed)
[name] [id number] Admission Data: 07/02/2014 9:28 PM Attending Provider: Toy Baker, MD PCP:O'SULLIVAN,MELISSA S., NP Code Status: DNR  [name] is a 80 y.o. female patient admitted from ED:  -No acute distress noted.  -No complaints of shortness of breath.  -No complaints of chest pain.   Cardiac Monitoring: Box # 2 in place. Cardiac monitor yields:sinus bradycardia.  Blood pressure 157/74, pulse 56, temperature 98.2 F (36.8 C), temperature source Oral, resp. rate 18, height 4\' 11"  (1.499 m), weight 70.4 kg (155 lb 3.3 oz), SpO2 97 %.   IV Fluids:  IV in place, occlusive dsg intact without redness, IV cath forearm right, condition patent and no redness and left, condition patent and no redness and antecubital right, condition patent and no redness normal saline.   Allergies:  Penicillins  Past Medical History:   has a past medical history of Depressive disorder, not elsewhere classified; Anxiety state, unspecified; Benign neoplasm of colon; Diverticulitis of colon (without mention of hemorrhage); Mixed hyperlipidemia; Obesity, unspecified; Unspecified essential hypertension; Chronic rhinitis; GERD; INTERTRIGO, CANDIDAL; Major neurocognitive disorder due to Alzheimer's disease, probable, without behavioral disturbance; PRESSURE ULCER STAGE I; and Arthritis.  Past Surgical History:   has past surgical history that includes Cholecystectomy; Total hip arthroplasty (Bilateral, 1991, 2001); Vesicovaginal fistula closure w/ TAH; Rotator cuff repair; Abdominal hysterectomy (1980); and Cesarean section.  Social History:   reports that she quit smoking about 56 years ago. Her smoking use included Cigarettes. She has a .4 pack-year smoking history. She has never used smokeless tobacco. She reports that she does not drink alcohol or use illicit drugs.  Skin: charted on [facility]  Patient/Family orientated to room. Information packet given to patient/family. Admission  inpatient armband information verified with patient/family to include name and date of birth and placed on patient arm. Side rails up x 2, fall assessment and education completed with patient/family. Patient/family able to verbalize understanding of risk associated with falls and verbalized understanding to call for assistance before getting out of bed. Call light within reach. Patient/family able to voice and demonstrate understanding of unit orientation instructions.

## 2014-07-02 NOTE — ED Notes (Signed)
Constipated. Possible bladder infection. She may be dehydrated. She was sent down here after being examined by her MD upstairs in the office.

## 2014-07-02 NOTE — Progress Notes (Signed)
Admitting doctor paged.

## 2014-07-02 NOTE — Progress Notes (Signed)
Antibiotic Dose adjustment for renal function:  For a CrCl of 29 ml/min, the levofloxacin dose has been adjusted to 750 mg IV x 1 (given in ED), then 500 mg IV q48 hr per protocol.

## 2014-07-02 NOTE — Progress Notes (Signed)
Pre visit review using our clinic review tool, if applicable. No additional management support is needed unless otherwise documented below in the visit note. 

## 2014-07-02 NOTE — ED Provider Notes (Signed)
CSN: [id number]     Arrival date & time 07/02/14  1512 History   First MD Initiated Contact with Patient 07/02/14 1535     Chief Complaint  Patient presents with  . Dehydration     (Consider location/radiation/quality/duration/timing/severity/associated sxs/prior Treatment) HPI Comments: Patient with a history of dementia, HTN, and recurrent bladder infections presents with worsening cough and weakness. She has a history of dementia and lives at home with her husband. She is at baseline confused but normally ambulates with a walker. Her family states over last 2 weeks she's had worsening cough and hasn't been wanting to get out of bed. Her appetite has been diminished. She recently established care with a new primary care physician and was seen today. She was noted to be hypotensive with a systolic blood pressure in 80s. She also was noted to have crackles in her lungs and a large amount of stool in rectal exam. She was sent down here for further evaluation due to concern for possible pneumonia, UTI or sepsis. History is limited as patient has dementia but is obtained from the family. They have not noted her to have any fevers. She's had no vomiting. She's had some constipation. She seems to be having normal urination.   Past Medical History  Diagnosis Date  . Depressive disorder, not elsewhere classified   . Anxiety state, unspecified   . Benign neoplasm of colon   . Diverticulitis of colon (without mention of hemorrhage)   . Mixed hyperlipidemia   . Obesity, unspecified   . Unspecified essential hypertension   . Chronic rhinitis   . GERD   . INTERTRIGO, CANDIDAL   . Major neurocognitive disorder due to Alzheimer's disease, probable, without behavioral disturbance     Followed by Dr. Brett Fairy at [facility].   Marland Kitchen PRESSURE ULCER STAGE I     sacral, recurrent  . Arthritis    Past Surgical History  Procedure Laterality Date  . Cholecystectomy    . Total hip arthroplasty  Bilateral 1991, 2001  . Vesicovaginal fistula closure w/ tah    . Rotator cuff repair    . Abdominal hysterectomy  1980  . Cesarean section     Family History  Problem Relation Age of Onset  . Breast cancer Mother   . Colon cancer Mother    History  Substance Use Topics  . Smoking status: Former Smoker -- 0.20 packs/day for 2 years    Types: Cigarettes    Quit date: 07/16/1958  . Smokeless tobacco: Never Used     Comment: stopped 67 years ago  . Alcohol Use: No   OB History    No data available     Review of Systems  Unable to perform ROS: Dementia      Allergies  Penicillins  Home Medications   Prior to Admission medications   Medication Sig Start Date End Date Taking? Authorizing Provider  albuterol (PROVENTIL HFA;VENTOLIN HFA) 108 (90 BASE) MCG/ACT inhaler Inhale 2 puffs into the lungs every 4 (four) hours as needed for wheezing or shortness of breath. 09/11/12   Chesley Mires, MD  ALPRAZolam Duanne Moron) 0.25 MG tablet Take 1 tablet (0.25 mg total) by mouth every 8 (eight) hours as needed. 01/19/11   Tanda Rockers, MD  aspirin 325 MG EC tablet Take 325 mg by mouth daily.      Historical Provider, MD  citalopram (CELEXA) 20 MG tablet Take 20 mg by mouth daily.    Historical Provider, MD  cloNIDine (CATAPRES) 0.1  MG tablet TAKE ONE TABLET BY MOUTH ONCE DAILY 05/14/14   Rowe Clack, MD  dexbrompheniramine-pseudoephedrine (DRIXORAL) 6-120 MG per tablet Take 1 tablet by mouth as needed.    Historical Provider, MD  donepezil (ARICEPT) 10 MG tablet Take 1 tablet (10 mg total) by mouth daily. 05/20/14   Asencion Partridge Dohmeier, MD  famotidine (PEPCID) 20 MG tablet Take 20 mg by mouth at bedtime.      Historical Provider, MD  hyoscyamine (LEVSIN, ANASPAZ) 0.125 MG tablet Take 0.125 mg by mouth every 6 (six) hours as needed.    Historical Provider, MD  ibuprofen (ADVIL,MOTRIN) 200 MG tablet 3 tabs with meal twice daily as needed    Historical Provider, MD  Memantine HCl ER (NAMENDA XR)  28 MG CP24 Take 1 capsule by mouth daily.    Historical Provider, MD  mometasone (NASONEX) 50 MCG/ACT nasal spray Place 1-2 sprays into the nose daily.    Historical Provider, MD  Multiple Vitamin (MULTIVITAMIN WITH MINERALS) TABS tablet Take 1 tablet by mouth daily.    Historical Provider, MD  oxybutynin (DITROPAN) 5 MG tablet Take 5 mg by mouth 3 (three) times daily.    Houston M Grant Fontana., MD  pantoprazole (PROTONIX) 40 MG tablet Take 1 tablet (40 mg total) by mouth daily before breakfast. 02/15/14   Rowe Clack, MD  potassium chloride SA (KLOR-CON M20) 20 MEQ tablet Take 1 tablet (20 mEq total) by mouth daily. 06/21/14   Rowe Clack, MD  promethazine (PHENERGAN) 25 MG tablet Take 25 mg by mouth every 6 (six) hours as needed for nausea or vomiting.    Historical Provider, MD  triamterene-hydrochlorothiazide (MAXZIDE) 75-50 MG per tablet Take 1 tablet by mouth daily as needed.      Historical Provider, MD  trimethoprim (TRIMPEX) 100 MG tablet Take 100 mg by mouth daily. 06/28/14   Historical Provider, MD  valACYclovir (VALTREX) 1000 MG tablet Take 1,000 mg by mouth daily as needed.    Historical Provider, MD   BP 118/61 mmHg  Pulse 59  Temp(Src) 98.5 F (36.9 C) (Rectal)  Resp 20  Ht 4\' 11"  (1.499 m)  Wt 156 lb (70.761 kg)  BMI 31.49 kg/m2  SpO2 95% Physical Exam  Constitutional: She appears well-developed and well-nourished.  HENT:  Head: Normocephalic and atraumatic.  Mouth/Throat: Oropharynx is clear and moist.  Eyes: Pupils are equal, round, and reactive to light.  Neck: Normal range of motion. Neck supple.  Cardiovascular: Normal rate and regular rhythm.   Murmur heard. Pulmonary/Chest: Effort normal and breath sounds normal. No respiratory distress. She has no wheezes. She has no rales. She exhibits no tenderness.  Abdominal: Soft. Bowel sounds are normal. There is no tenderness. There is no rebound and no guarding.  Musculoskeletal: Normal range of motion. She  exhibits no edema.  Lymphadenopathy:    She has no cervical adenopathy.  Neurological: She is alert.  Confused  Skin: Skin is warm and dry. No rash noted.  Psychiatric: She has a normal mood and affect.    ED Course  Procedures (including critical care time) Labs Review Labs Reviewed  CBC WITH DIFFERENTIAL - Abnormal; Notable for the following:    Eosinophils Relative 6 (*)    All other components within normal limits  COMPREHENSIVE METABOLIC PANEL - Abnormal; Notable for the following:    Glucose, Bld 129 (*)    Creatinine, Ser 1.30 (*)    Albumin 3.4 (*)    GFR calc non Af Amer 37 (*)  GFR calc Af Amer 43 (*)    All other components within normal limits  URINALYSIS, ROUTINE W REFLEX MICROSCOPIC - Abnormal; Notable for the following:    APPearance CLOUDY (*)    Hgb urine dipstick MODERATE (*)    Protein, ur 30 (*)    Leukocytes, UA SMALL (*)    All other components within normal limits  URINE MICROSCOPIC-ADD ON - Abnormal; Notable for the following:    Squamous Epithelial / LPF FEW (*)    Bacteria, UA MANY (*)    All other components within normal limits  URINE CULTURE  CULTURE, BLOOD (ROUTINE X 2)  CULTURE, BLOOD (ROUTINE X 2)  PRO B NATRIURETIC PEPTIDE  I-STAT CG4 LACTIC ACID, ED    Imaging Review Dg Chest 2 View  07/02/2014   CLINICAL DATA:  Confusion. Positive pulmonary crackles on evaluation.  EXAM: CHEST  2 VIEW  COMPARISON:  CT appears at 04/30/2014  FINDINGS: Stable cardiac and mediastinal contours. Markedly low lung volumes. Small left pleural effusion. Underlying left lung base consolidative pulmonary opacities. Perihilar interstitial pulmonary opacities. No definite large pneumothorax. Limited visualization of the osseous skeleton.  IMPRESSION: Markedly limited evaluation.  Low lung volumes.  Suggestion of small left pleural effusion with underlying consolidative opacities representing atelectasis or infection. Infection not excluded.  Perihilar interstitial  pulmonary opacities represent pulmonary vascular redistribution versus mild interstitial pulmonary edema.   Electronically Signed   By: Lovey Newcomer M.D.   On: 07/02/2014 18:01     EKG Interpretation   Date/Time:  Friday July 02 2014 16:08:04 EST Ventricular Rate:  63 PR Interval:  208 QRS Duration: 70 QT Interval:  440 QTC Calculation: 450 R Axis:   -11 Text Interpretation:  Normal sinus rhythm Inferior infarct , age  undetermined Anterior infarct , age undetermined Abnormal ECG since last  tracing no significant change Confirmed by Emric Kowalewski  MD, [employer] ([id number]) on  07/02/2014 6:44:45 PM      MDM   Final diagnoses:  Community acquired pneumonia  UTI (lower urinary tract infection)    Patient's blood pressures remained stable in the ED. She was given IV fluids. Her creatinine is mildly elevated as compared to her prior values. Her lactate is normal. Her x-rays show signs of pneumonia. She's had no hospitalizations in the last 3 months. Given that she's recently taken a course of Zithromax, I started her on Levaquin for IV treatment pneumonia as well as possible UTI. Her urine was sent for culture as well. I spoke with the hospitalist, Dr. Roel Cluck who has accepted the patient for transfer to [facility] for admission.    Malvin Johns, MD 07/02/14 [phone]

## 2014-07-02 NOTE — H&P (Signed)
[facility] History and Physical  Patient: [name]  NFA:[id number]  DOB: 1931/05/29  DOS: the patient was seen and examined on 07/02/2014 PCP: Nance Pear., NP  Chief Complaint: Hypotension and cough  HPI: [name] is a 80 y.o. female with Past medical history of dementia, GERD, upper respiratory cough syndrome, hypertension, dyslipidemia. Patient presents with complaints of cough that has been ongoing since last one week. As an outpatient she has been treated with azithromycin. She had an episode of dizziness today while she was with her PCPs office and was also found to be hypotensive with blood pressure in 80s. She was having some crackles in her lungs and was sent to ER for further workup. Family also reports she also has some constipation. She has not been eating and drinking since last few days. She has been taking all her medications regularly. No chest pain orthopnea or PND reported  The patient is coming from home. And at her baseline dependent for most of her ADL.  Review of Systems: as mentioned in the history of present illness.  A Comprehensive review of the other systems is negative.  Past Medical History  Diagnosis Date  . Depressive disorder, not elsewhere classified   . Anxiety state, unspecified   . Benign neoplasm of colon   . Diverticulitis of colon (without mention of hemorrhage)   . Mixed hyperlipidemia   . Obesity, unspecified   . Unspecified essential hypertension   . Chronic rhinitis   . GERD   . INTERTRIGO, CANDIDAL   . Major neurocognitive disorder due to Alzheimer's disease, probable, without behavioral disturbance     Followed by Dr. Brett Fairy at [facility].   Marland Kitchen PRESSURE ULCER STAGE I     sacral, recurrent  . Arthritis    Past Surgical History  Procedure Laterality Date  . Cholecystectomy    . Total hip arthroplasty Bilateral 1991, 2001  . Vesicovaginal fistula closure w/ tah    . Rotator cuff repair     . Abdominal hysterectomy  1980  . Cesarean section     Social History:  reports that she quit smoking about 56 years ago. Her smoking use included Cigarettes. She has a .4 pack-year smoking history. She has never used smokeless tobacco. She reports that she does not drink alcohol or use illicit drugs.  Allergies  Allergen Reactions  . Penicillins     REACTION: adolesent    Family History  Problem Relation Age of Onset  . Breast cancer Mother   . Colon cancer Mother     Prior to Admission medications   Medication Sig Start Date End Date Taking? Authorizing Provider  albuterol (PROVENTIL HFA;VENTOLIN HFA) 108 (90 BASE) MCG/ACT inhaler Inhale 2 puffs into the lungs every 4 (four) hours as needed for wheezing or shortness of breath. 09/11/12   Chesley Mires, MD  ALPRAZolam Duanne Moron) 0.25 MG tablet Take 1 tablet (0.25 mg total) by mouth every 8 (eight) hours as needed. 01/19/11   Tanda Rockers, MD  aspirin 325 MG EC tablet Take 325 mg by mouth daily.      Historical Provider, MD  citalopram (CELEXA) 20 MG tablet Take 20 mg by mouth daily.    Historical Provider, MD  cloNIDine (CATAPRES) 0.1 MG tablet TAKE ONE TABLET BY MOUTH ONCE DAILY 05/14/14   Rowe Clack, MD  dexbrompheniramine-pseudoephedrine (DRIXORAL) 6-120 MG per tablet Take 1 tablet by mouth as needed.    Historical Provider, MD  donepezil (ARICEPT) 10 MG tablet  Take 1 tablet (10 mg total) by mouth daily. 05/20/14   Asencion Partridge Dohmeier, MD  famotidine (PEPCID) 20 MG tablet Take 20 mg by mouth at bedtime.      Historical Provider, MD  hyoscyamine (LEVSIN, ANASPAZ) 0.125 MG tablet Take 0.125 mg by mouth every 6 (six) hours as needed.    Historical Provider, MD  ibuprofen (ADVIL,MOTRIN) 200 MG tablet 3 tabs with meal twice daily as needed    Historical Provider, MD  Memantine HCl ER (NAMENDA XR) 28 MG CP24 Take 1 capsule by mouth daily.    Historical Provider, MD  mometasone (NASONEX) 50 MCG/ACT nasal spray Place 1-2 sprays into the nose  daily.    Historical Provider, MD  Multiple Vitamin (MULTIVITAMIN WITH MINERALS) TABS tablet Take 1 tablet by mouth daily.    Historical Provider, MD  oxybutynin (DITROPAN) 5 MG tablet Take 5 mg by mouth 3 (three) times daily.    Houston M Grant Fontana., MD  pantoprazole (PROTONIX) 40 MG tablet Take 1 tablet (40 mg total) by mouth daily before breakfast. 02/15/14   Rowe Clack, MD  potassium chloride SA (KLOR-CON M20) 20 MEQ tablet Take 1 tablet (20 mEq total) by mouth daily. 06/21/14   Rowe Clack, MD  promethazine (PHENERGAN) 25 MG tablet Take 25 mg by mouth every 6 (six) hours as needed for nausea or vomiting.    Historical Provider, MD  triamterene-hydrochlorothiazide (MAXZIDE) 75-50 MG per tablet Take 1 tablet by mouth daily as needed.      Historical Provider, MD  trimethoprim (TRIMPEX) 100 MG tablet Take 100 mg by mouth daily. 06/28/14   Historical Provider, MD  valACYclovir (VALTREX) 1000 MG tablet Take 1,000 mg by mouth daily as needed.    Historical Provider, MD    Physical Exam: Filed Vitals:   07/02/14 1630 07/02/14 1700 07/02/14 1941 07/02/14 2101  BP: 111/57 118/61 132/52 157/74  Pulse: 57 59 56 56  Temp:    98.2 F (36.8 C)  TempSrc:    Oral  Resp:   20 18  Height:      Weight:    70.4 kg (155 lb 3.3 oz)  SpO2: 96% 95% 97% 97%    General: Alert, Awake and Oriented to Time, Place and Person. Appear in mild distress Eyes: PERRL ENT: Oral Mucosa clear moist. Neck: no JVD Cardiovascular: S1 and S2 Present, no Murmur, Peripheral Pulses Present Respiratory: Bilateral Air entry equal and Decreased, faint basal Crackles, no wheezes Abdomen: Bowel Sound present, Soft and no tender Skin: no Rash Extremities: no Pedal edema, no calf tenderness Neurologic: Grossly no focal neuro deficit.  Labs on Admission:  CBC:  Recent Labs Lab 07/02/14 1535 07/02/14 1538  WBC  --  8.6  NEUTROABS  --  5.5  HGB 15.0 14.5  HCT  --  43.9  MCV  --  98.7  PLT  --  175     CMP     Component Value Date/Time   NA 144 07/02/2014 1538   K 4.1 07/02/2014 1538   CL 104 07/02/2014 1538   CO2 27 07/02/2014 1538   GLUCOSE 129* 07/02/2014 1538   BUN 19 07/02/2014 1538   CREATININE 1.30* 07/02/2014 1538   CALCIUM 9.4 07/02/2014 1538   PROT 7.4 07/02/2014 1538   ALBUMIN 3.4* 07/02/2014 1538   AST 15 07/02/2014 1538   ALT 5 07/02/2014 1538   ALKPHOS 66 07/02/2014 1538   BILITOT 0.3 07/02/2014 1538   GFRNONAA 37* 07/02/2014 1538  GFRAA 43* 07/02/2014 1538    No results for input(s): LIPASE, AMYLASE in the last 168 hours. No results for input(s): AMMONIA in the last 168 hours.  No results for input(s): CKTOTAL, CKMB, CKMBINDEX, TROPONINI in the last 168 hours. BNP (last 3 results)  Recent Labs  07/02/14 1838  PROBNP 470.0*    Radiological Exams on Admission: Dg Chest 2 View  07/02/2014   CLINICAL DATA:  Confusion. Positive pulmonary crackles on evaluation.  EXAM: CHEST  2 VIEW  COMPARISON:  CT appears at 04/30/2014  FINDINGS: Stable cardiac and mediastinal contours. Markedly low lung volumes. Small left pleural effusion. Underlying left lung base consolidative pulmonary opacities. Perihilar interstitial pulmonary opacities. No definite large pneumothorax. Limited visualization of the osseous skeleton.  IMPRESSION: Markedly limited evaluation.  Low lung volumes.  Suggestion of small left pleural effusion with underlying consolidative opacities representing atelectasis or infection. Infection not excluded.  Perihilar interstitial pulmonary opacities represent pulmonary vascular redistribution versus mild interstitial pulmonary edema.   Electronically Signed   By: Lovey Newcomer M.D.   On: 07/02/2014 18:01    Assessment/Plan Principal Problem:   CAP (community acquired pneumonia) Active Problems:   Essential hypertension   GERD   Major neurocognitive disorder due to Alzheimer's disease, probable, without behavioral disturbance   Upper airway cough  syndrome   Hypotension   1. CAP (community acquired pneumonia)  Possible UTI  The patient presents with complains of cough. She is found to have possible infiltrate. With this the patient is currently admitted in the hospital. Follow blood culture urine culture sputum culture urine antigens. Continue treating her with levofloxacin for 20 acquired pneumonia.  2. Possible diastolic heart failure. Patient's proBNP is mildly elevated but chest x-ray also appears as congested. Echocardiogram in the morning. Avoid IV hydration unless needed  3. GERD. Upper airway cough syndrome Continue home medications.  4. Hypertension. Patient was initially hypotensive. At present I'm continuing her clonidine starting tomorrow and holding other medications.  Advance goals of care discussion: DNR/DNI as per her living will as per my discussion with patient's family.   DVT Prophylaxis: subcutaneous Heparin Nutrition: Advance diet as tolerated  Family Communication: Family was present at bedside, opportunity was given to ask question and all questions were answered satisfactorily at the time of interview. Disposition: Admitted to inpatient in telemetry unit.  Author: Berle Mull, MD [facility] Pager: [phone] 07/02/2014, 11:42 PM    If 7PM-7AM, please contact night-coverage [email or web address] Password [email or web address]

## 2014-07-02 NOTE — Progress Notes (Signed)
Received pt report from [facility].

## 2014-07-02 NOTE — Patient Instructions (Addendum)
Purchase duoderm dressings and apply one dressing to sacral area every three days. Change as needed for soiling. Add miralax one cap by mouth once daily  Purchase and Egg Crate mattress for bed and a pad for chair.  Please proceed to the ED on the first floor.

## 2014-07-02 NOTE — Progress Notes (Signed)
Subjective:    Patient ID: [name], female    DOB: Apr 29, 1931, 80 y.o.   MRN: [id number]  HPI  [name] is an 80 yr old female who presents today to establish care. She has been followed by [name].  She presents with her husband and daughter who provide most of the history.   She presents today with chief complaint of cough. Cough has been present x 2 weeks. Reports that she has been treated recently for URI and UTI.  She saw [name] ([facility]). She was treated with zpak on 12/8. Symptoms worsened.  Cough worsened. She lays in bed 18-20 hrs a day.  No fever.    Frequent UTI- established with urology. She is followed with [facility].   Depression/Anxiety-maintained  On xanax prn and citalopram. Intermittent anxiety.   HTN- on catapres, maxide.  BP Readings from Last 3 Encounters:  07/02/14 100/70  06/16/14 148/79  04/30/14 120/76   Alzheimer's dementia- on aricept and namenda. She is followed by [name]. Some sundowning.        Review of Systems  Constitutional: Negative for unexpected weight change.  HENT: Negative for rhinorrhea.   Respiratory: Positive for cough.   Cardiovascular: Negative for leg swelling.  Gastrointestinal: Negative for nausea and vomiting.  Genitourinary: Negative for dysuria and frequency.  Musculoskeletal: Negative for myalgias and arthralgias.  Neurological: Negative for headaches.  Hematological:       Had some rectal bleeding the other day after BM. Had blood on toilet seat.     Past Medical History  Diagnosis Date  . Depressive disorder, not elsewhere classified   . Anxiety state, unspecified   . Benign neoplasm of colon   . Diverticulitis of colon (without mention of hemorrhage)   . Mixed hyperlipidemia   . Obesity, unspecified   . Unspecified essential hypertension   . Chronic rhinitis   . GERD   . INTERTRIGO, CANDIDAL   . Major neurocognitive disorder due to Alzheimer's disease,  probable, without behavioral disturbance     Follow[facility] by [name] at [facility].   Marland Kitchen PRESSURE ULCER STAGE I     sacral, recurrent  . Arthritis     History   Social History  . Marital Status: Married    Spouse Name: [name]    Number of Children: 6  . Years of Education: 12   Occupational History  . Housewife    Social History Main Topics  . Smoking status: Former Smoker -- 0.20 packs/day for 2 years    Types: Cigarettes    Quit date: 07/16/1958  . Smokeless tobacco: Never Used     Comment: stopped 67 years ago  . Alcohol Use: No  . Drug Use: No  . Sexual Activity: Not on file   Other Topics Concern  . Not on file   Social History Narrative   Patient is married [name]) and lives with her husband.   Patient is a homemaker.   Patient has 6 children.   Patient is right-handed.   Patient has a high school education.   Patient does not drink any caffeine.    Past Surgical History  Procedure Laterality Date  . Cholecystectomy    . Total hip arthroplasty Bilateral 1991, 2001  . Vesicovaginal fistula closure w/ tah    . Rotator cuff repair    . Abdominal hysterectomy  1980  . Cesarean section      Family History  Problem Relation Age of  Onset  . Breast cancer Mother   . Colon cancer Mother     Allergies  Allergen Reactions  . Penicillins     REACTION: adolesent    Current Outpatient Prescriptions on File Prior to Visit  Medication Sig Dispense Refill  . albuterol (PROVENTIL HFA;VENTOLIN HFA) 108 (90 BASE) MCG/ACT inhaler Inhale 2 puffs into the lungs every 4 (four) hours as needed for wheezing or shortness of breath.    . ALPRAZolam (XANAX) 0.25 MG tablet Take 1 tablet (0.25 mg total) by mouth every 8 (eight) hours as needed. 60 tablet 2  . aspirin 325 MG EC tablet Take 325 mg by mouth daily.      . cloNIDine (CATAPRES) 0.1 MG tablet TAKE ONE TABLET BY MOUTH ONCE DAILY 90 tablet 1  . donepezil (ARICEPT) 10 MG tablet Take 1 tablet (10 mg total)  by mouth daily. 30 tablet 0  . famotidine (PEPCID) 20 MG tablet Take 20 mg by mouth at bedtime.      Marland Kitchen ibuprofen (ADVIL,MOTRIN) 200 MG tablet 3 tabs with meal twice daily as needed    . Multiple Vitamin (MULTIVITAMIN WITH MINERALS) TABS tablet Take 1 tablet by mouth daily.    Marland Kitchen oxybutynin (DITROPAN) 5 MG tablet Take 5 mg by mouth 3 (three) times daily.    . pantoprazole (PROTONIX) 40 MG tablet Take 1 tablet (40 mg total) by mouth daily before breakfast. 90 tablet 3  . potassium chloride SA (KLOR-CON M20) 20 MEQ tablet Take 1 tablet (20 mEq total) by mouth daily. 90 tablet 0  . triamterene-hydrochlorothiazide (MAXZIDE) 75-50 MG per tablet Take 1 tablet by mouth daily as needed.      . [DISCONTINUED] oxybutynin (DITROPAN) 5 MG tablet Take 1 tablet (5 mg total) by mouth 3 (three) times daily. 270 tablet 3   No current facility-administered medications on file prior to visit.    BP 100/70 mmHg  Pulse 94  Temp(Src) 98 F (36.7 C) (Oral)  Resp 18  Ht 4' 11.5" (1.511 m)  Wt 156 lb 9.6 oz (71.033 kg)  BMI 31.11 kg/m2  SpO2 99%       Objective:   Physical Exam  Constitutional: She appears well-developed and well-nourished.  Weak appearing white female.   HENT:  Head: Normocephalic and atraumatic.  Cardiovascular: Normal rate and regular rhythm.   No murmur heard. Pulmonary/Chest: Effort normal.  Faint bibasilar crackles.  Abdominal: Soft. Bowel sounds are normal. She exhibits no distension.  Genitourinary:  Hard stool in rectal vault- Heme +. + external hemorrhoids   Musculoskeletal: She exhibits no edema.  Neurological: She is alert.  Confused  Skin: Skin is warm and dry.  Stage 1-2 sacral ulcer  Psychiatric: She has a normal mood and affect. Her behavior is normal. Judgment and thought content normal.  Irritable.           Assessment & Plan:

## 2014-07-03 LAB — COMPREHENSIVE METABOLIC PANEL
ALT: 5 U/L (ref 0–35)
ANION GAP: 11 (ref 5–15)
AST: 15 U/L (ref 0–37)
Albumin: 2.9 g/dL — ABNORMAL LOW (ref 3.5–5.2)
Alkaline Phosphatase: 59 U/L (ref 39–117)
BUN: 12 mg/dL (ref 6–23)
CO2: 23 mEq/L (ref 19–32)
CREATININE: 0.91 mg/dL (ref 0.50–1.10)
Calcium: 8.8 mg/dL (ref 8.4–10.5)
Chloride: 104 mEq/L (ref 96–112)
GFR calc Af Amer: 67 mL/min — ABNORMAL LOW (ref >90)
GFR, EST NON AFRICAN AMERICAN: 58 mL/min — AB (ref >90)
Glucose, Bld: 82 mg/dL (ref 70–99)
Potassium: 3.5 mEq/L — ABNORMAL LOW (ref 3.7–5.3)
Sodium: 138 mEq/L (ref 137–147)
Total Bilirubin: 0.4 mg/dL (ref 0.3–1.2)
Total Protein: 6.7 g/dL (ref 6.0–8.3)

## 2014-07-03 LAB — CBC WITH DIFFERENTIAL/PLATELET
BASOS ABS: 0 10*3/uL (ref 0.0–0.1)
BASOS PCT: 0 % (ref 0–1)
Eosinophils Absolute: 0.4 10*3/uL (ref 0.0–0.7)
Eosinophils Relative: 6 % — ABNORMAL HIGH (ref 0–5)
HEMATOCRIT: 40.8 % (ref 36.0–46.0)
Hemoglobin: 13.7 g/dL (ref 12.0–15.0)
LYMPHS PCT: 33 % (ref 12–46)
Lymphs Abs: 2.3 10*3/uL (ref 0.7–4.0)
MCH: 32.2 pg (ref 26.0–34.0)
MCHC: 33.6 g/dL (ref 30.0–36.0)
MCV: 96 fL (ref 78.0–100.0)
MONO ABS: 0.6 10*3/uL (ref 0.1–1.0)
Monocytes Relative: 9 % (ref 3–12)
Neutro Abs: 3.6 10*3/uL (ref 1.7–7.7)
Neutrophils Relative %: 52 % (ref 43–77)
Platelets: 143 10*3/uL — ABNORMAL LOW (ref 150–400)
RBC: 4.25 MIL/uL (ref 3.87–5.11)
RDW: 14.2 % (ref 11.5–15.5)
WBC: 7 10*3/uL (ref 4.0–10.5)

## 2014-07-03 LAB — TSH: TSH: 3.58 u[IU]/mL (ref 0.350–4.500)

## 2014-07-03 LAB — TROPONIN I

## 2014-07-03 MED ORDER — SENNOSIDES-DOCUSATE SODIUM 8.6-50 MG PO TABS
1.0000 | ORAL_TABLET | Freq: Two times a day (BID) | ORAL | Status: DC
  Administered 2014-07-03 – 2014-07-06 (×7): 1 via ORAL
  Filled 2014-07-03 (×7): qty 1

## 2014-07-03 MED ORDER — POLYETHYLENE GLYCOL 3350 17 G PO PACK
17.0000 g | PACK | Freq: Every day | ORAL | Status: DC
  Administered 2014-07-03 – 2014-07-06 (×4): 17 g via ORAL
  Filled 2014-07-03 (×4): qty 1

## 2014-07-03 MED ORDER — BISACODYL 10 MG RE SUPP: 10.0000 mg | Freq: Every day | RECTAL | Status: DC | PRN

## 2014-07-03 MED ORDER — LEVOFLOXACIN IN D5W 750 MG/150ML IV SOLN
750.0000 mg | INTRAVENOUS | Status: DC
  Administered 2014-07-04: 750 mg via INTRAVENOUS
  Filled 2014-07-03 (×2): qty 150

## 2014-07-03 MED ORDER — HALOPERIDOL LACTATE 5 MG/ML IJ SOLN
5.0000 mg | Freq: Four times a day (QID) | INTRAMUSCULAR | Status: DC | PRN
  Administered 2014-07-03: 5 mg via INTRAVENOUS

## 2014-07-03 MED ORDER — HALOPERIDOL LACTATE 5 MG/ML IJ SOLN
INTRAMUSCULAR | Status: AC
  Administered 2014-07-03: 5 mg
  Filled 2014-07-03: qty 1

## 2014-07-03 NOTE — Progress Notes (Signed)
[facility] PROGRESS NOTE  [name] JJK:[id number] DOB: [birth year] DOA: 07/02/2014 PCP: Cristina Pear., NP  Assessment/Plan: 1. CAP 1. Continued productive cough with possible infiltrate on cxr 2. Pan cultures obtained 3. Pt is continued on empiric levaquin 4. Afebrile 5. Family reports failing a trial of cipro and azithro as an outpatient 2. Possible diastolic CHF 1. 2d echo pending 2. Monitor closely 3. GERD 1. Cont current regimen 4. HTN 1. BP stable, albeit suboptimally controlled 2. Question if anxiety is contributing to elevated BP 3. Haldol PRN agitation for now 5. Dementia 1. Haldol PRN 6. DVT prophylaxis 1. Heparin subQ  Code Status: DNR Family Communication: Pt in room Disposition Plan: Pending   Consultants:    Procedures:    Antibiotics:  Levaquin 12/18>>>  HPI/Subjective: Eager to go home. No acute events noted overnight.  Objective: Filed Vitals:   07/02/14 1700 07/02/14 1941 07/02/14 2101 07/03/14 0457  BP: 118/61 132/52 157/74 161/82  Pulse: 59 56 56 58  Temp:   98.2 F (36.8 C) 97.9 F (36.6 C)  TempSrc:   Oral Oral  Resp:  20 18 18   Height:   4\' 11"  (1.499 m)   Weight:   70.4 kg (155 lb 3.3 oz) 70.4 kg (155 lb 3.3 oz)  SpO2: 95% 97% 97% 97%    Intake/Output Summary (Last 24 hours) at 07/03/14 1541 Last data filed at 07/03/14 0040  Gross per 24 hour  Intake    120 ml  Output    350 ml  Net   -230 ml   Filed Weights   07/02/14 1522 07/02/14 2101 07/03/14 0457  Weight: 70.761 kg (156 lb) 70.4 kg (155 lb 3.3 oz) 70.4 kg (155 lb 3.3 oz)    Exam:   General:  Awake, in nad  Cardiovascular: regular, s1, s2  Respiratory: normal resp effort, no wheezing  Abdomen: soft, nondistended  Musculoskeletal: perfused, no clubbing   Data Reviewed: Basic Metabolic Panel:  Recent Labs Lab 07/02/14 1538 07/03/14 0737  NA 144 138  K 4.1 3.5*  CL 104 104  CO2 27 23  GLUCOSE 129* 82  BUN 19 12  CREATININE  1.30* 0.91  CALCIUM 9.4 8.8   Liver Function Tests:  Recent Labs Lab 07/02/14 1538 07/03/14 0737  AST 15 15  ALT 5 5  ALKPHOS 66 59  BILITOT 0.3 0.4  PROT 7.4 6.7  ALBUMIN 3.4* 2.9*   No results for input(s): LIPASE, AMYLASE in the last 168 hours. No results for input(s): AMMONIA in the last 168 hours. CBC:  Recent Labs Lab 07/02/14 1535 07/02/14 1538 07/03/14 0737  WBC  --  8.6 7.0  NEUTROABS  --  5.5 3.6  HGB 15.0 14.5 13.7  HCT  --  43.9 40.8  MCV  --  98.7 96.0  PLT  --  175 143*   Cardiac Enzymes:  Recent Labs Lab 07/03/14 0737  TROPONINI <0.30   BNP (last 3 results)  Recent Labs  07/02/14 1838  PROBNP 470.0*   CBG: No results for input(s): GLUCAP in the last 168 hours.  Recent Results (from the past 240 hour(s))  Culture, blood (routine x 2)     Status: None (Preliminary result)   Collection Time: 07/02/14  6:55 PM  Result Value Ref Range Status   Specimen Description BLOOD LEFT ARM  Final   Special Requests BOTTLES DRAWN AEROBIC AND ANAEROBIC 5CC  Final   Culture  Setup Time   Final    07/02/2014 23:01  Performed at [facility]   Final           BLOOD CULTURE RECEIVED NO GROWTH TO DATE CULTURE WILL BE HELD FOR 5 DAYS BEFORE ISSUING A FINAL NEGATIVE REPORT Performed at [facility]    Report Status PENDING  Incomplete     Studies: Dg Chest 2 View  07/02/2014   CLINICAL DATA:  Confusion. Positive pulmonary crackles on evaluation.  EXAM: CHEST  2 VIEW  COMPARISON:  CT appears at 04/30/2014  FINDINGS: Stable cardiac and mediastinal contours. Markedly low lung volumes. Small left pleural effusion. Underlying left lung base consolidative pulmonary opacities. Perihilar interstitial pulmonary opacities. No definite large pneumothorax. Limited visualization of the osseous skeleton.  IMPRESSION: Markedly limited evaluation.  Low lung volumes.  Suggestion of small left pleural effusion with underlying consolidative opacities  representing atelectasis or infection. Infection not excluded.  Perihilar interstitial pulmonary opacities represent pulmonary vascular redistribution versus mild interstitial pulmonary edema.   Electronically Signed   By: Lovey Newcomer M.D.   On: 07/02/2014 18:01    Scheduled Meds: . aspirin  325 mg Oral Daily  . citalopram  20 mg Oral Daily  . cloNIDine  0.1 mg Oral Daily  . donepezil  10 mg Oral Daily  . famotidine  20 mg Oral QHS  . fluticasone  2 spray Each Nare Daily  . heparin  5,000 Units Subcutaneous 3 times per day  . [START ON 07/04/2014] levofloxacin (LEVAQUIN) IV  750 mg Intravenous Q48H  . Memantine HCl ER  1 capsule Oral Daily  . oxybutynin  5 mg Oral TID  . pantoprazole  40 mg Oral QAC breakfast  . polyethylene glycol  17 g Oral Daily  . senna-docusate  1 tablet Oral BID   Continuous Infusions:   Principal Problem:   CAP (community acquired pneumonia) Active Problems:   Essential hypertension   GERD   Major neurocognitive disorder due to Alzheimer's disease, probable, without behavioral disturbance   Upper airway cough syndrome   Hypotension  Time spent: 72min  [name], [facility] Pager [phone]. If 7PM-7AM, please contact night-coverage at [email or web address], password [facility] 07/03/2014, 3:41 PM  LOS: 1 day

## 2014-07-03 NOTE — Progress Notes (Signed)
Pt BP 163/67. Donnal Debar, NP paged. Will continue to monitor pt.

## 2014-07-03 NOTE — Progress Notes (Signed)
38 yof admitted 12/18 w/ possible CAP/UTI. Afeb, wbc wnl. Cx pending. Levofloxacin 750mg  IV x 1 dose given in ED. Pharmacy consult to adjust abx for renal function. Levofloxacin ordered 500mg  IV q48h for CrCl~29, SCr 1.3 on admit. SCr now 0.91 (CrCl~40). Adjusted levo dose to 750mg  IV q48h 12/18 levo >>  12/18 BCx2 >> 12/18 UC >>  Plan: -Levo 750mg  IV q48h -Continue to monitor renal funct daily for potential abx dosing adjustments  Elicia Lamp, PharmD Clinical Pharmacist - Resident Pager [phone] 07/03/2014 12:40 PM

## 2014-07-04 DIAGNOSIS — I359 Nonrheumatic aortic valve disorder, unspecified: Secondary | ICD-10-CM

## 2014-07-04 DIAGNOSIS — K5909 Other constipation: Secondary | ICD-10-CM | POA: Insufficient documentation

## 2014-07-04 DIAGNOSIS — R195 Other fecal abnormalities: Secondary | ICD-10-CM | POA: Insufficient documentation

## 2014-07-04 LAB — URINE CULTURE
Colony Count: NO GROWTH
Culture: NO GROWTH
Special Requests: NORMAL

## 2014-07-04 LAB — CBC WITH DIFFERENTIAL/PLATELET
Basophils Absolute: 0 10*3/uL (ref 0.0–0.1)
Basophils Relative: 1 % (ref 0–1)
EOS ABS: 0.5 10*3/uL (ref 0.0–0.7)
EOS PCT: 8 % — AB (ref 0–5)
HCT: 40.4 % (ref 36.0–46.0)
Hemoglobin: 13.3 g/dL (ref 12.0–15.0)
Lymphocytes Relative: 32 % (ref 12–46)
Lymphs Abs: 1.9 10*3/uL (ref 0.7–4.0)
MCH: 31.8 pg (ref 26.0–34.0)
MCHC: 32.9 g/dL (ref 30.0–36.0)
MCV: 96.7 fL (ref 78.0–100.0)
Monocytes Absolute: 0.6 10*3/uL (ref 0.1–1.0)
Monocytes Relative: 10 % (ref 3–12)
NEUTROS PCT: 49 % (ref 43–77)
Neutro Abs: 3 10*3/uL (ref 1.7–7.7)
Platelets: 140 10*3/uL — ABNORMAL LOW (ref 150–400)
RBC: 4.18 MIL/uL (ref 3.87–5.11)
RDW: 14.6 % (ref 11.5–15.5)
WBC: 6 10*3/uL (ref 4.0–10.5)

## 2014-07-04 LAB — BASIC METABOLIC PANEL
Anion gap: 12 (ref 5–15)
BUN: 13 mg/dL (ref 6–23)
CALCIUM: 9.2 mg/dL (ref 8.4–10.5)
CO2: 24 mEq/L (ref 19–32)
Chloride: 102 mEq/L (ref 96–112)
Creatinine, Ser: 0.94 mg/dL (ref 0.50–1.10)
GFR, EST AFRICAN AMERICAN: 64 mL/min — AB (ref >90)
GFR, EST NON AFRICAN AMERICAN: 55 mL/min — AB (ref >90)
Glucose, Bld: 82 mg/dL (ref 70–99)
Potassium: 4.2 mEq/L (ref 3.7–5.3)
Sodium: 138 mEq/L (ref 137–147)

## 2014-07-04 NOTE — Assessment & Plan Note (Signed)
Stage 1-2. Advised family to Purchase duoderm dressings and apply one dressing to sacral area every three days. Change as needed for soiling. Purchase and [employer] mattress for bed and a pad for chair.

## 2014-07-04 NOTE — Progress Notes (Signed)
  Echocardiogram 2D Echocardiogram has been performed.  [name] 07/04/2014, 2:57 PM

## 2014-07-04 NOTE — Assessment & Plan Note (Signed)
Advised family to add miralax as needed for constipation.

## 2014-07-04 NOTE — Progress Notes (Signed)
[facility] PROGRESS NOTE  LASHAUNA ARPIN OHY:[id number] DOB: 07/04/1931 DOA: 07/02/2014 PCP: Nance Pear., NP  Assessment/Plan: 1. CAP 1. Continued productive cough with possible infiltrate on cxr 2. Pan cultures obtained, pending 3. Pt is continued on empiric levaquin 4. Afebrile 5. Family reports failing a trial of cipro and azithro as an outpatient 6. Continues with much secretions, will give trial of flutter valve 2. Possible UTI 1. Urinalysis suggested UTI 2. Urine cx is without growth 3. Possible diastolic CHF 1. 2d echo pending 2. Monitor closely 4. GERD 1. Cont current regimen 5. HTN 1. BP stable, albeit suboptimally controlled 2. Question if anxiety is contributing to elevated BP 3. Haldol PRN agitation for now 6. Dementia 1. Haldol PRN 7. DVT prophylaxis 1. Heparin subQ  Code Status: DNR Family Communication: Pt in room Disposition Plan: Pending   Consultants:    Procedures:    Antibiotics:  Levaquin 12/18>>>  HPI/Subjective: Eager to go home. Pt is without complaints.  Objective: Filed Vitals:   07/03/14 0457 07/03/14 1543 07/03/14 2228 07/04/14 0521  BP: 161/82 146/72 163/67 151/70  Pulse: 58 59 53 48  Temp: 97.9 F (36.6 C) 97.9 F (36.6 C) 98.4 F (36.9 C) 97.6 F (36.4 C)  TempSrc: Oral Oral Oral Oral  Resp: 18 16 20 20   Height:      Weight: 70.4 kg (155 lb 3.3 oz)   70.2 kg (154 lb 12.2 oz)  SpO2: 97% 96% 98% 97%    Intake/Output Summary (Last 24 hours) at 07/04/14 1355 Last data filed at 07/04/14 0959  Gross per 24 hour  Intake    120 ml  Output      0 ml  Net    120 ml   Filed Weights   07/02/14 2101 07/03/14 0457 07/04/14 0521  Weight: 70.4 kg (155 lb 3.3 oz) 70.4 kg (155 lb 3.3 oz) 70.2 kg (154 lb 12.2 oz)    Exam:   General:  Awake, in nad  Cardiovascular: regular, s1, s2  Respiratory: normal resp effort, no wheezing  Abdomen: soft, nondistended  Musculoskeletal: perfused, no clubbing    Data Reviewed: Basic Metabolic Panel:  Recent Labs Lab 07/02/14 1538 07/03/14 0737 07/04/14 0530  NA 144 138 138  K 4.1 3.5* 4.2  CL 104 104 102  CO2 27 23 24   GLUCOSE 129* 82 82  BUN 19 12 13   CREATININE 1.30* 0.91 0.94  CALCIUM 9.4 8.8 9.2   Liver Function Tests:  Recent Labs Lab 07/02/14 1538 07/03/14 0737  AST 15 15  ALT 5 5  ALKPHOS 66 59  BILITOT 0.3 0.4  PROT 7.4 6.7  ALBUMIN 3.4* 2.9*   No results for input(s): LIPASE, AMYLASE in the last 168 hours. No results for input(s): AMMONIA in the last 168 hours. CBC:  Recent Labs Lab 07/02/14 1535 07/02/14 1538 07/03/14 0737 07/04/14 0530  WBC  --  8.6 7.0 6.0  NEUTROABS  --  5.5 3.6 3.0  HGB 15.0 14.5 13.7 13.3  HCT  --  43.9 40.8 40.4  MCV  --  98.7 96.0 96.7  PLT  --  175 143* 140*   Cardiac Enzymes:  Recent Labs Lab 07/03/14 0737  TROPONINI <0.30   BNP (last 3 results)  Recent Labs  07/02/14 1838  PROBNP 470.0*   CBG: No results for input(s): GLUCAP in the last 168 hours.  Recent Results (from the past 240 hour(s))  Urine culture     Status: None   Collection Time:  07/02/14  6:38 PM  Result Value Ref Range Status   Specimen Description URINE, CATHETERIZED  Final   Special Requests Normal  Final   Culture  Setup Time   Final    07/03/2014 00:01 Performed at [place] NO GROWTH Performed at [facility]   Final   Culture NO GROWTH Performed at [facility]   Final   Report Status 07/04/2014 FINAL  Final  Culture, blood (routine x 2)     Status: None (Preliminary result)   Collection Time: 07/02/14  6:55 PM  Result Value Ref Range Status   Specimen Description BLOOD LEFT ARM  Final   Special Requests BOTTLES DRAWN AEROBIC AND ANAEROBIC 5CC  Final   Culture  Setup Time   Final    07/02/2014 23:01 Performed at [facility]    Culture   Final           BLOOD CULTURE RECEIVED NO GROWTH TO DATE CULTURE WILL BE HELD FOR 5 DAYS  BEFORE ISSUING A FINAL NEGATIVE REPORT Performed at [facility]    Report Status PENDING  Incomplete     Studies: Dg Chest 2 View  07/02/2014   CLINICAL DATA:  Confusion. Positive pulmonary crackles on evaluation.  EXAM: CHEST  2 VIEW  COMPARISON:  CT appears at 04/30/2014  FINDINGS: Stable cardiac and mediastinal contours. Markedly low lung volumes. Small left pleural effusion. Underlying left lung base consolidative pulmonary opacities. Perihilar interstitial pulmonary opacities. No definite large pneumothorax. Limited visualization of the osseous skeleton.  IMPRESSION: Markedly limited evaluation.  Low lung volumes.  Suggestion of small left pleural effusion with underlying consolidative opacities representing atelectasis or infection. Infection not excluded.  Perihilar interstitial pulmonary opacities represent pulmonary vascular redistribution versus mild interstitial pulmonary edema.   Electronically Signed   By: Lovey Newcomer M.D.   On: 07/02/2014 18:01    Scheduled Meds: . aspirin  325 mg Oral Daily  . citalopram  20 mg Oral Daily  . cloNIDine  0.1 mg Oral Daily  . donepezil  10 mg Oral Daily  . famotidine  20 mg Oral QHS  . fluticasone  2 spray Each Nare Daily  . heparin  5,000 Units Subcutaneous 3 times per day  . levofloxacin (LEVAQUIN) IV  750 mg Intravenous Q48H  . Memantine HCl ER  1 capsule Oral Daily  . oxybutynin  5 mg Oral TID  . pantoprazole  40 mg Oral QAC breakfast  . polyethylene glycol  17 g Oral Daily  . senna-docusate  1 tablet Oral BID   Continuous Infusions:   Principal Problem:   CAP (community acquired pneumonia) Active Problems:   Essential hypertension   GERD   Major neurocognitive disorder due to Alzheimer's disease, probable, without behavioral disturbance   Upper airway cough syndrome   Hypotension  Time spent: 80min  CHIU, [place] [facility] Pager [phone]. If 7PM-7AM, please contact night-coverage at [email or web address],  password [facility] 07/04/2014, 1:55 PM  LOS: 2 days

## 2014-07-04 NOTE — Progress Notes (Signed)
Utilization Review Completed.Yariah Selvey T12/20/2015  

## 2014-07-04 NOTE — Assessment & Plan Note (Signed)
Spoke with husband and daughter. They do not wish to put patient through colonoscopy or extensive GI work up.

## 2014-07-05 LAB — BASIC METABOLIC PANEL
Anion gap: 14 (ref 5–15)
BUN: 16 mg/dL (ref 6–23)
CALCIUM: 9 mg/dL (ref 8.4–10.5)
CO2: 24 mEq/L (ref 19–32)
Chloride: 103 mEq/L (ref 96–112)
Creatinine, Ser: 0.96 mg/dL (ref 0.50–1.10)
GFR calc Af Amer: 63 mL/min — ABNORMAL LOW (ref >90)
GFR, EST NON AFRICAN AMERICAN: 54 mL/min — AB (ref >90)
Glucose, Bld: 69 mg/dL — ABNORMAL LOW (ref 70–99)
POTASSIUM: 5 meq/L (ref 3.7–5.3)
Sodium: 141 mEq/L (ref 137–147)

## 2014-07-05 LAB — CBC
HCT: 41.2 % (ref 36.0–46.0)
Hemoglobin: 13.7 g/dL (ref 12.0–15.0)
MCH: 32 pg (ref 26.0–34.0)
MCHC: 33.3 g/dL (ref 30.0–36.0)
MCV: 96.3 fL (ref 78.0–100.0)
PLATELETS: 149 10*3/uL — AB (ref 150–400)
RBC: 4.28 MIL/uL (ref 3.87–5.11)
RDW: 14.3 % (ref 11.5–15.5)
WBC: 7.9 10*3/uL (ref 4.0–10.5)

## 2014-07-05 MED ORDER — GUAIFENESIN ER 600 MG PO TB12
600.0000 mg | ORAL_TABLET | Freq: Two times a day (BID) | ORAL | Status: DC
  Administered 2014-07-05 – 2014-07-06 (×3): 600 mg via ORAL
  Filled 2014-07-05 (×4): qty 1

## 2014-07-05 NOTE — Care Management Note (Signed)
    Page 1 of 1   07/06/2014     4:41:34 PM CARE MANAGEMENT NOTE 07/06/2014  Patient:  [name]   Account Number:  [id number]  Date Initiated:  07/05/2014  Documentation initiated by:  Tomi Bamberger  Subjective/Objective Assessment:   dx dehydration  admit- lives with spouse, uses rolling walker     Action/Plan:   pt eval rec snf   Anticipated DC Date:  07/06/2014   Anticipated DC Plan:  SKILLED NURSING FACILITY  In-house referral  Clinical Social Worker      DC Planning Services  CM consult      Choice offered to / List presented to:             Status of service:  Completed, signed off Medicare Important Message given?  YES (If response is "NO", the following Medicare IM given date fields will be blank) Date Medicare IM given:  07/05/2014 Medicare IM given by:  Tomi Bamberger Date Additional Medicare IM given:   Additional Medicare IM given by:    Discharge Disposition:  [place]  Per UR Regulation:  Reviewed for med. necessity/level of care/duration of stay  If discussed at [place] of Stay Meetings, dates discussed:    Comments:  07/06/14 [place] RN, BSN [phone] patient dc today to [facility].  07/05/14 1816 Tomi Bamberger RN, BSN [phone] patient lives with spouse, per physical therapy recs snf, CSW referral.

## 2014-07-05 NOTE — Evaluation (Signed)
Physical Therapy Evaluation Patient Details Name: [name] MRN: [id number] DOB: [birth year] Today's Date: 07/05/2014   History of Present Illness  [name] is a 80 y.o. female admitted 12/18 with Past medical history of dementia, GERD, upper respiratory cough syndrome, hypertension, dyslipidemia.  Clinical Impression  Pt presenting with dementia, HOH, and extreme deconditioning. Pt able to follow commands but unable to state place or date. Pt with urinary incontinence - RN tech notified. Pt currently requires increased assist with transfers and has limited ambulation endurance. Pt to benefit from SNF upon d/c to maximize functional recovery for safe transition home. Spoke with daughter regarding SNF.    Follow Up Recommendations SNF;Supervision/Assistance - 24 hour    Equipment Recommendations   (defer to SNF)    Recommendations for Other Services       Precautions / Restrictions Precautions Precautions: Fall Restrictions Weight Bearing Restrictions: No      Mobility  Bed Mobility Overal bed mobility: Needs Assistance Bed Mobility: Supine to Sit     Supine to sit: HOB elevated;Max assist     General bed mobility comments: pt iniated LE mvmt off EOB, definite use of bed rail, assist for trunk elevation  Transfers Overall transfer level: Needs assistance Equipment used:  (1 person lift from front with gait belt) Transfers: Sit to/from [facility] Sit to Stand: Mod assist Stand pivot transfers: Mod assist       General transfer comment: max directional v/c's, short, shuffled steps  Ambulation/Gait                Stairs            Wheelchair Mobility    Modified Rankin (Stroke Patients Only)       Balance Overall balance assessment: Needs assistance Sitting-balance support: Feet supported;Single extremity supported Sitting balance-Leahy Scale: Fair     Standing balance support: Bilateral upper extremity  supported Standing balance-Leahy Scale: Poor Standing balance comment: requires UE assist                             Pertinent Vitals/Pain Pain Assessment: No/denies pain    Home Living Family/patient expects to be discharged to:: Skilled nursing facility Living Arrangements: Spouse/significant other                    Prior Function Level of Independence: Needs assistance   Gait / Transfers Assistance Needed: used RW  ADL's / Homemaking Assistance Needed: spouse and dtr assist with laundry, meals, bathing, dressing        Hand Dominance   Dominant Hand: Right    Extremity/Trunk Assessment   Upper Extremity Assessment: Generalized weakness           Lower Extremity Assessment: Generalized weakness      Cervical / Trunk Assessment: Kyphotic  Communication   Communication: HOH  Cognition Arousal/Alertness: Awake/alert Behavior During Therapy: Flat affect Overall Cognitive Status: History of cognitive impairments - at baseline (pt with dementia)       Memory: Decreased short-term memory              General Comments      Exercises        Assessment/Plan    PT Assessment Patient needs continued PT services  PT Diagnosis Difficulty walking;Generalized weakness   PT Problem List Decreased strength;Decreased activity tolerance;Decreased balance;Decreased mobility  PT Treatment Interventions Gait training;Functional mobility training;Therapeutic activities;Therapeutic exercise;Balance training   PT Goals (  Current goals can be found in the Care Plan section) Acute Rehab PT Goals PT Goal Formulation: With family Time For Goal Achievement: 07/12/14 Potential to Achieve Goals: Fair    Frequency Min 3X/week   Barriers to discharge        Co-evaluation               End of Session Equipment Utilized During Treatment: Gait belt Activity Tolerance: Patient tolerated treatment well Patient left: in chair;with call  bell/phone within reach;with family/visitor present Nurse Communication: Mobility status         Time: 5456-2563 PT Time Calculation (min) (ACUTE ONLY): 23 min   Charges:   PT Evaluation $Initial PT Evaluation Tier I: 1 Procedure PT Treatments $Therapeutic Activity: 8-22 mins   PT G CodesKingsley Callander 07/05/2014, 4:54 PM   Kittie Plater, PT, DPT Pager #: [phone] Office #: [phone]

## 2014-07-05 NOTE — Clinical Social Work Psychosocial (Signed)
Clinical Social Work Department BRIEF PSYCHOSOCIAL ASSESSMENT 07/05/2014  Patient:  [name], [name]     Account Number:  [id number]     Admit date:  07/02/2014  Clinical Social Worker:  Lovey Newcomer  Date/Time:  07/05/2014 03:57 PM  Referred by:  Physician  Date Referred:  07/05/2014 Referred for  SNF Placement   Other Referral:   NA   Interview type:  Family Other interview type:   Patient's daughter and family friend interviewed at bedside to complete assessment.    PSYCHOSOCIAL DATA Living Status:  HUSBAND Admitted from facility:   Level of care:   Primary support name:  [name] and [name] Primary support relationship to patient:  FAMILY Degree of support available:   Support is strong.    CURRENT CONCERNS Current Concerns  Post-Acute Placement   Other Concerns:   NA    SOCIAL WORK ASSESSMENT / PLAN CSW met with patient and family at bedside to complete assessment. Patient was admitted from her home where she lives with her husband. Daughter expresses great concern for her mother and does not believe she will be able to return to her home at discharge. Family would like for patient to go to SNF at discharge for short term rehabilitation, as she can usually function somewhat independently at her baseline. CSW explained SNF search/placement process and answered daughter's questions. Daughter prefers [facility]. Daughter understands that this facility cannot  be guaranteed for the patient. Daughter appeared calm and engaged in assessment. CSW will assist as appropriate.   Assessment/plan status:  Psychosocial Support/Ongoing Assessment of Needs Other assessment/ plan:   Complete Fl2, Fax, PASRR   Information/referral to community resources:   CSW contact information and SNF list given.    PATIENT'S/FAMILY'S RESPONSE TO PLAN OF CARE: Patient's family plans for the patient to DC to SNF when medically stable. CSW will assist as appropriate.        Liz Beach MSW, [place], [place], [phone]

## 2014-07-05 NOTE — Progress Notes (Signed)
[facility] PROGRESS NOTE  [name] ZHG:[id number] DOB: 07-10-1931 DOA: 07/02/2014 PCP: Nance Pear., NP  Assessment/Plan: 1. CAP 1. Continued productive cough with possible infiltrate on cxr 2. Pan cultures obtained, pending 3. Pt is continued on empiric levaquin 4. Family reports failing a trial of cipro and azithro as an outpatient 5. Continues with much secretions, flutter valve ordered 12/20. Will give trial of mucinex 6. Will repeat CXR in AM 7. No leukocytosis. Remains afebrile 2. Possible UTI 1. Urinalysis suggested UTI 2. Urine cx is without growth 3. Possible chronic diastolic CHF 1. 2d echo with EF of 65-70% 2. Grade 1 diastolic dysfunction on 2d echo 3. Monitor closely 4. GERD 1. Cont current regimen 5. HTN 1. BP stable, albeit suboptimally controlled 2. Question if anxiety is contributing to elevated BP 3. Haldol PRN agitation for now 6. Dementia 1. Haldol PRN 7. DVT prophylaxis 1. Heparin subQ  Code Status: DNR Family Communication: Pt in room, daughter at bedside Disposition Plan: Pending possible SNF, PT/OT consulted   Consultants:    Procedures:    Antibiotics:  Levaquin 12/18>>>  HPI/Subjective: Eager to go home. Pt is without complaints.  Objective: Filed Vitals:   07/04/14 2314 07/05/14 0524 07/05/14 0957 07/05/14 1425  BP: 137/73 149/89 151/64 138/62  Pulse: 69 66    Temp: 98.5 F (36.9 C) 98.4 F (36.9 C)  97.3 F (36.3 C)  TempSrc: Oral Oral  Oral  Resp: 20 18  16   Height:      Weight:  71.1 kg (156 lb 12 oz)    SpO2: 96% 98%  92%    Intake/Output Summary (Last 24 hours) at 07/05/14 1535 Last data filed at 07/05/14 1434  Gross per 24 hour  Intake    450 ml  Output      0 ml  Net    450 ml   Filed Weights   07/03/14 0457 07/04/14 0521 07/05/14 0524  Weight: 70.4 kg (155 lb 3.3 oz) 70.2 kg (154 lb 12.2 oz) 71.1 kg (156 lb 12 oz)    Exam:   General:  Awake, in nad  Cardiovascular: regular,  s1, s2  Respiratory: normal resp effort, no wheezing  Abdomen: soft, nondistended  Musculoskeletal: perfused, no clubbing   Data Reviewed: Basic Metabolic Panel:  Recent Labs Lab 07/02/14 1538 07/03/14 0737 07/04/14 0530 07/05/14 0610  NA 144 138 138 141  K 4.1 3.5* 4.2 5.0  CL 104 104 102 103  CO2 27 23 24 24   GLUCOSE 129* 82 82 69*  BUN 19 12 13 16   CREATININE 1.30* 0.91 0.94 0.96  CALCIUM 9.4 8.8 9.2 9.0   Liver Function Tests:  Recent Labs Lab 07/02/14 1538 07/03/14 0737  AST 15 15  ALT 5 5  ALKPHOS 66 59  BILITOT 0.3 0.4  PROT 7.4 6.7  ALBUMIN 3.4* 2.9*   No results for input(s): LIPASE, AMYLASE in the last 168 hours. No results for input(s): AMMONIA in the last 168 hours. CBC:  Recent Labs Lab 07/02/14 1535 07/02/14 1538 07/03/14 0737 07/04/14 0530 07/05/14 0610  WBC  --  8.6 7.0 6.0 7.9  NEUTROABS  --  5.5 3.6 3.0  --   HGB 15.0 14.5 13.7 13.3 13.7  HCT  --  43.9 40.8 40.4 41.2  MCV  --  98.7 96.0 96.7 96.3  PLT  --  175 143* 140* 149*   Cardiac Enzymes:  Recent Labs Lab 07/03/14 0737  TROPONINI <0.30   BNP (last 3 results)  Recent Labs  07/02/14 1838  PROBNP 470.0*   CBG: No results for input(s): GLUCAP in the last 168 hours.  Recent Results (from the past 240 hour(s))  Urine culture     Status: None   Collection Time: 07/02/14  6:38 PM  Result Value Ref Range Status   Specimen Description URINE, CATHETERIZED  Final   Special Requests Normal  Final   Culture  Setup Time   Final    07/03/2014 00:01 Performed at [place] NO GROWTH Performed at [facility]   Final   Culture NO GROWTH Performed at [facility]   Final   Report Status 07/04/2014 FINAL  Final  Culture, blood (routine x 2)     Status: None (Preliminary result)   Collection Time: 07/02/14  6:40 PM  Result Value Ref Range Status   Specimen Description BLOOD RIGHT ARM  Final   Special Requests   Final    Normal  BOTTLES DRAWN AEROBIC AND ANAEROBIC 5CC EACH   Culture  Setup Time   Final    07/03/2014 05:41 Performed at [facility]    Culture   Final           BLOOD CULTURE RECEIVED NO GROWTH TO DATE CULTURE WILL BE HELD FOR 5 DAYS BEFORE ISSUING A FINAL NEGATIVE REPORT Performed at [facility]    Report Status PENDING  Incomplete  Culture, blood (routine x 2)     Status: None (Preliminary result)   Collection Time: 07/02/14  6:55 PM  Result Value Ref Range Status   Specimen Description BLOOD LEFT ARM  Final   Special Requests BOTTLES DRAWN AEROBIC AND ANAEROBIC 5CC  Final   Culture  Setup Time   Final    07/02/2014 23:01 Performed at [facility]    Culture   Final           BLOOD CULTURE RECEIVED NO GROWTH TO DATE CULTURE WILL BE HELD FOR 5 DAYS BEFORE ISSUING A FINAL NEGATIVE REPORT Performed at [facility]    Report Status PENDING  Incomplete     Studies: No results found.  Scheduled Meds: . aspirin  325 mg Oral Daily  . citalopram  20 mg Oral Daily  . cloNIDine  0.1 mg Oral Daily  . donepezil  10 mg Oral Daily  . famotidine  20 mg Oral QHS  . fluticasone  2 spray Each Nare Daily  . guaiFENesin  600 mg Oral BID  . heparin  5,000 Units Subcutaneous 3 times per day  . levofloxacin (LEVAQUIN) IV  750 mg Intravenous Q48H  . Memantine HCl ER  1 capsule Oral Daily  . oxybutynin  5 mg Oral TID  . pantoprazole  40 mg Oral QAC breakfast  . polyethylene glycol  17 g Oral Daily  . senna-docusate  1 tablet Oral BID   Continuous Infusions:   Principal Problem:   CAP (community acquired pneumonia) Active Problems:   Essential hypertension   GERD   Major neurocognitive disorder due to Alzheimer's disease, probable, without behavioral disturbance   Upper airway cough syndrome   Hypotension  Time spent: 75min  [facility], [place] [facility] Pager [phone]. If 7PM-7AM, please contact night-coverage at [email or web address], password  [facility] 07/05/2014, 3:35 PM  LOS: 3 days

## 2014-07-05 NOTE — Progress Notes (Signed)
Pt unable to follow instructions to exhale forcibly enough into flutter valve for effective therapy.

## 2014-07-06 ENCOUNTER — Inpatient Hospital Stay (HOSPITAL_COMMUNITY): Payer: Medicare Other

## 2014-07-06 ENCOUNTER — Telehealth: Payer: Self-pay | Admitting: Family

## 2014-07-06 LAB — CBC
HCT: 39.8 % (ref 36.0–46.0)
HEMOGLOBIN: 13.6 g/dL (ref 12.0–15.0)
MCH: 33.2 pg (ref 26.0–34.0)
MCHC: 34.2 g/dL (ref 30.0–36.0)
MCV: 97.1 fL (ref 78.0–100.0)
Platelets: 207 10*3/uL (ref 150–400)
RBC: 4.1 MIL/uL (ref 3.87–5.11)
RDW: 14.3 % (ref 11.5–15.5)
WBC: 8.1 10*3/uL (ref 4.0–10.5)

## 2014-07-06 MED ORDER — GUAIFENESIN ER 600 MG PO TB12: 600.0000 mg | ORAL_TABLET | Freq: Two times a day (BID) | ORAL | Status: DC

## 2014-07-06 MED ORDER — ALPRAZOLAM 0.25 MG PO TABS: 0.2500 mg | ORAL_TABLET | Freq: Every evening | ORAL | Status: DC | PRN

## 2014-07-06 MED ORDER — LISINOPRIL 2.5 MG PO TABS
2.5000 mg | ORAL_TABLET | Freq: Every day | ORAL | Status: DC
  Administered 2014-07-06: 2.5 mg via ORAL
  Filled 2014-07-06: qty 1

## 2014-07-06 MED ORDER — LEVOFLOXACIN 750 MG PO TABS: 750.0000 mg | ORAL_TABLET | ORAL | Status: DC

## 2014-07-06 MED ORDER — LEVOFLOXACIN 750 MG PO TABS
750.0000 mg | ORAL_TABLET | ORAL | Status: DC
  Filled 2014-07-06: qty 1

## 2014-07-06 MED ORDER — LISINOPRIL 2.5 MG PO TABS: 2.5000 mg | ORAL_TABLET | Freq: Every day | ORAL | Status: DC

## 2014-07-06 NOTE — Telephone Encounter (Signed)
Pleas arrange a 1 week follow up.

## 2014-07-06 NOTE — Progress Notes (Signed)
OT Cancellation Note  Patient Details Name: [name] MRN: [id number] DOB: 1931/06/17   Cancelled Treatment:    Reason Eval/Treat Not Completed: OT screened. Pt is Medicare and current D/C plan is SNF (per chart, pt dependent for most of ADLs). No apparent immediate acute care OT needs, therefore will defer OT to SNF. If OT eval is needed please call Acute Rehab Dept. at [phone] or text page OT at [phone].    Benito Mccreedy OTR/L [phone] 07/06/2014, 8:25 AM

## 2014-07-06 NOTE — Progress Notes (Signed)
Pt picked up by [facility] to transport to disposition. Francis Gaines Kelse Ploch RN.

## 2014-07-06 NOTE — Clinical Social Work Placement (Signed)
Clinical Social Work Department CLINICAL SOCIAL WORK PLACEMENT NOTE 07/06/2014  Patient: [name], [name] Account Number: [id number] Admit date: 07/02/2014  Clinical Social Worker: Lovey Newcomer Date/time: 07/05/2014 04:44 PM  Clinical Social Work is seeking post-discharge placement for this patient at the following level of care: SKILLED NURSING (*CSW will update this form in Epic as items are completed)   07/05/2014 Patient/family provided with [place] Department of Clinical Social Work's list of facilities offering this level of care within the geographic area requested by the patient (or if unable, by the patient's family).  07/05/2014 Patient/family informed of their freedom to choose among providers that offer the needed level of care, that participate in Medicare, Medicaid or managed care program needed by the patient, have an available bed and are willing to accept the patient.  07/05/2014 Patient/family informed of [facility]' ownership interest in [facility], as well as of the fact that they are under no obligation to receive care at this facility.  PASARR submitted to EDS on 07/05/2014 PASARR number received on 07/05/2014  FL2 transmitted to all facilities in geographic area requested by pt/family on 07/05/2014 FL2 transmitted to all facilities within larger geographic area on   Patient informed that his/her managed care company has contracts with or will negotiate with certain facilities, including the following:    Patient/family informed of bed offers received: 07/06/2014 Patient chooses bed at [facility] Physician recommends and patient chooses bed at   Patient to be transferred to [facility] on 07/05/2014 Patient to be transferred to facility by Ambulance Patient and family notified of transfer on 07/06/2014 Name of family member notified: [name]  The following physician  request were entered in Epic:   Additional Comments:    Per MD patient ready for DC to [facility]. Auth received for SNF and information giving to [place] admission coordinator with facility. RN, patient, patient's family, and facility notified of DC. RN given number for report. DC packet on chart. [facility] transport requested for patient. CSW signing off.    Liz Beach MSW, [place], [place], [phone]

## 2014-07-06 NOTE — Progress Notes (Signed)
Pt A&O x1; pt discharge education and instructions completed; report called off to Sam at [place] home. Pt husband remains at bedside. Pt IV and telemetry removed; pt cleaned and awaiting on [facility] to transport to disposition. Pt discharge to [facility] SNF. Will continue to monitor pt till pick up. P. Amo Keltie Labell RN.

## 2014-07-06 NOTE — Discharge Summary (Signed)
Physician Discharge Summary  [name] TKW:[id number] DOB: [birth year] DOA: 07/02/2014  PCP: Nance Pear., NP  Admit date: 07/02/2014 Discharge date: 07/06/2014  Time spent: 30 minutes  Recommendations for Outpatient Follow-up:  1. Follow up with PCP in 1-2 weeks  Discharge Diagnoses:  Principal Problem:   CAP (community acquired pneumonia) Active Problems:   Essential hypertension   GERD   Major neurocognitive disorder due to Alzheimer's disease, probable, without behavioral disturbance   Upper airway cough syndrome   Hypotension  Discharge Condition: Improved  Diet recommendation: Heart healthy  Filed Weights   07/04/14 0521 07/05/14 0524 07/06/14 0523  Weight: 70.2 kg (154 lb 12.2 oz) 71.1 kg (156 lb 12 oz) 71.1 kg (156 lb 12 oz)    History of present illness:  Please see h and p from 12/18 for details. Briefly, pt presents with hypotension and cough. The patient was found to have findings worrisome for pneumonia and was admitted for further work up.  Hospital Course:  1. CAP 1. Continued productive cough with possible infiltrate on cxr 2. Blood cultures negative x 3 3. Pt is continued on empiric levaquin 4. Family reports failing a trial of cipro and azithro as an outpatient 5. Continues with much secretions, flutter valve ordered 12/20. Will give trial of mucinex 6. Repeat CXR with improvement 7. No leukocytosis. Remains afebrile 2. Possible UTI, ruled out 1. Urinalysis suggested UTI 2. Urine cx is without growth 3. Possible chronic diastolic CHF 1. 2d echo with EF of 65-70% 2. Grade 1 diastolic dysfunction on 2d echo 3. Monitor closely 4. GERD 1. Cont current regimen 5. HTN 1. BP stable, albeit suboptimally controlled 2. Question if anxiety is contributing to elevated BP 3. Haldol PRN agitation for now 4. Added low dose lisinopril at 2.5mg  daily 6. Dementia 1. Haldol PRN 7. DVT prophylaxis 1. Heparin  subQ  Procedures: none  Consultations:  none  Discharge Exam: Filed Vitals:   07/05/14 2312 07/06/14 0522 07/06/14 0523 07/06/14 1042  BP: 147/47 157/69  155/84  Pulse: 62 64  61  Temp: 98.3 F (36.8 C) 98.5 F (36.9 C)  98.2 F (36.8 C)  TempSrc: Oral Oral  Oral  Resp: 12 12  16   Height:      Weight:   71.1 kg (156 lb 12 oz)   SpO2: 95% 97%  96%    General: awake, in nad Cardiovascular: regular, s1, s2 Respiratory: normal resp effort, no wheezing  Discharge Instructions     Medication List    TAKE these medications        albuterol 108 (90 BASE) MCG/ACT inhaler  Commonly known as:  PROVENTIL HFA;VENTOLIN HFA  Inhale 2 puffs into the lungs every 4 (four) hours as needed for wheezing or shortness of breath.     ALPRAZolam 0.25 MG tablet  Commonly known as:  XANAX  Take 1 tablet (0.25 mg total) by mouth every 8 (eight) hours as needed.     aspirin 325 MG EC tablet  Take 325 mg by mouth daily.     citalopram 20 MG tablet  Commonly known as:  CELEXA  Take 20 mg by mouth daily.     cloNIDine 0.1 MG tablet  Commonly known as:  CATAPRES  TAKE ONE TABLET BY MOUTH ONCE DAILY     donepezil 10 MG tablet  Commonly known as:  ARICEPT  Take 1 tablet (10 mg total) by mouth daily.     famotidine 20 MG tablet  Commonly known as:  PEPCID  Take 20 mg by mouth at bedtime.     guaiFENesin 600 MG 12 hr tablet  Commonly known as:  MUCINEX  Take 1 tablet (600 mg total) by mouth 2 (two) times daily.     levofloxacin 750 MG tablet  Commonly known as:  LEVAQUIN  Take 1 tablet (750 mg total) by mouth every other day.     lisinopril 2.5 MG tablet  Commonly known as:  PRINIVIL,ZESTRIL  Take 1 tablet (2.5 mg total) by mouth daily.     mometasone 50 MCG/ACT nasal spray  Commonly known as:  NASONEX  Place 1-2 sprays into the nose daily.     multivitamin with minerals Tabs tablet  Take 1 tablet by mouth daily.     NAMENDA XR 28 MG Cp24  Generic drug:  Memantine HCl  ER  Take 1 capsule by mouth daily.     oxybutynin 5 MG tablet  Commonly known as:  DITROPAN  Take 5 mg by mouth 3 (three) times daily.     pantoprazole 40 MG tablet  Commonly known as:  PROTONIX  Take 1 tablet (40 mg total) by mouth daily before breakfast.     potassium chloride SA 20 MEQ tablet  Commonly known as:  KLOR-CON M20  Take 1 tablet (20 mEq total) by mouth daily.       Allergies  Allergen Reactions  . Penicillins     REACTION: adolesent   Follow-up Information    Follow up with Nance Pear., NP. Schedule an appointment as soon as possible for a visit in 1 week.   Specialty:  Internal Medicine   Contact information:   [address] [place] [address] [phone]        The results of significant diagnostics from this hospitalization (including imaging, microbiology, ancillary and laboratory) are listed below for reference.    Significant Diagnostic Studies: Dg Chest 1 View  07/06/2014   CLINICAL DATA:  Shortness of breath.  EXAM: CHEST - 1 VIEW  COMPARISON:  07/02/2014.  FINDINGS: Mediastinum and hilar structures normal. Subsegmental atelectasis and/or infiltrates have improved. Small left pleural effusion has improved. Heart size normal. No pneumothorax. No acute osseus abnormality.  IMPRESSION: Interim improvement of bibasilar atelectasis and/or infiltrates. And improvement of small left pleural effusion .   Electronically Signed   By: Marcello Moores  Register   On: 07/06/2014 08:01   Dg Chest 2 View  07/02/2014   CLINICAL DATA:  Confusion. Positive pulmonary crackles on evaluation.  EXAM: CHEST  2 VIEW  COMPARISON:  CT appears at 04/30/2014  FINDINGS: Stable cardiac and mediastinal contours. Markedly low lung volumes. Small left pleural effusion. Underlying left lung base consolidative pulmonary opacities. Perihilar interstitial pulmonary opacities. No definite large pneumothorax. Limited visualization of the osseous skeleton.  IMPRESSION:  Markedly limited evaluation.  Low lung volumes.  Suggestion of small left pleural effusion with underlying consolidative opacities representing atelectasis or infection. Infection not excluded.  Perihilar interstitial pulmonary opacities represent pulmonary vascular redistribution versus mild interstitial pulmonary edema.   Electronically Signed   By: Lovey Newcomer M.D.   On: 07/02/2014 18:01    Microbiology: Recent Results (from the past 240 hour(s))  Urine culture     Status: None   Collection Time: 07/02/14  6:38 PM  Result Value Ref Range Status   Specimen Description URINE, CATHETERIZED  Final   Special Requests Normal  Final   Culture  Setup Time   Final    07/03/2014  00:01 Performed at [place] Performed at [facility]   Final   Culture NO GROWTH Performed at [facility]   Final   Report Status 07/04/2014 FINAL  Final  Culture, blood (routine x 2)     Status: None (Preliminary result)   Collection Time: 07/02/14  6:40 PM  Result Value Ref Range Status   Specimen Description BLOOD RIGHT ARM  Final   Special Requests   Final    Normal BOTTLES DRAWN AEROBIC AND ANAEROBIC 5CC EACH   Culture  Setup Time   Final    07/03/2014 05:41 Performed at [facility]    Culture   Final           BLOOD CULTURE RECEIVED NO GROWTH TO DATE CULTURE WILL BE HELD FOR 5 DAYS BEFORE ISSUING A FINAL NEGATIVE REPORT Performed at [facility]    Report Status PENDING  Incomplete  Culture, blood (routine x 2)     Status: None (Preliminary result)   Collection Time: 07/02/14  6:55 PM  Result Value Ref Range Status   Specimen Description BLOOD LEFT ARM  Final   Special Requests BOTTLES DRAWN AEROBIC AND ANAEROBIC 5CC  Final   Culture  Setup Time   Final    07/02/2014 23:01 Performed at [facility]    Culture   Final           BLOOD CULTURE RECEIVED NO GROWTH TO DATE CULTURE WILL BE HELD FOR 5 DAYS BEFORE ISSUING A  FINAL NEGATIVE REPORT Performed at [facility]    Report Status PENDING  Incomplete     Labs: Basic Metabolic Panel:  Recent Labs Lab 07/02/14 1538 07/03/14 0737 07/04/14 0530 07/05/14 0610  NA 144 138 138 141  K 4.1 3.5* 4.2 5.0  CL 104 104 102 103  CO2 27 23 24 24   GLUCOSE 129* 82 82 69*  BUN 19 12 13 16   CREATININE 1.30* 0.91 0.94 0.96  CALCIUM 9.4 8.8 9.2 9.0   Liver Function Tests:  Recent Labs Lab 07/02/14 1538 07/03/14 0737  AST 15 15  ALT 5 5  ALKPHOS 66 59  BILITOT 0.3 0.4  PROT 7.4 6.7  ALBUMIN 3.4* 2.9*   No results for input(s): LIPASE, AMYLASE in the last 168 hours. No results for input(s): AMMONIA in the last 168 hours. CBC:  Recent Labs Lab 07/02/14 1538 07/03/14 0737 07/04/14 0530 07/05/14 0610 07/06/14 0616  WBC 8.6 7.0 6.0 7.9 8.1  NEUTROABS 5.5 3.6 3.0  --   --   HGB 14.5 13.7 13.3 13.7 13.6  HCT 43.9 40.8 40.4 41.2 39.8  MCV 98.7 96.0 96.7 96.3 97.1  PLT 175 143* 140* 149* 207   Cardiac Enzymes:  Recent Labs Lab 07/03/14 0737  TROPONINI <0.30   BNP: BNP (last 3 results)  Recent Labs  07/02/14 1838  PROBNP 470.0*   CBG: No results for input(s): GLUCAP in the last 168 hours.  Signed:  Naveah Brave, Orpah Melter  [facility] 07/06/2014, 12:58 PM

## 2014-07-08 LAB — CULTURE, BLOOD (ROUTINE X 2): CULTURE: NO GROWTH

## 2014-07-08 NOTE — Telephone Encounter (Signed)
Spoke with patient's husband who states that patient is currently in nursing home.  He was unsure of the name of it.  Will schedule follow up once discharged.

## 2014-07-09 LAB — CULTURE, BLOOD (ROUTINE X 2)
Culture: NO GROWTH
Special Requests: NORMAL

## 2014-07-28 ENCOUNTER — Telehealth: Payer: Self-pay | Admitting: Family

## 2014-07-28 NOTE — Telephone Encounter (Signed)
Caller name: meagan from [facility] Relation to pt: Call back number: [phone] Pharmacy:  Reason for call:   Wants to know if Lenna [facility] will sign orders for skilled nursing, PT, OT, and a cna.

## 2014-07-28 NOTE — Telephone Encounter (Signed)
OK, has she been discharged from a facility? If so I need to see her back in the office.

## 2014-07-29 ENCOUNTER — Telehealth: Payer: Self-pay | Admitting: Neurology

## 2014-07-29 NOTE — Telephone Encounter (Signed)
Patient was in hospital for 4 days and then nursing home for 20 days. Spoke with spouse who states that he is trying "get it all together". Advised that we needed to see patient to follow up. States ok but his calendar is not up to date. He will call back to schedule when he can get everything straight.   LM for [facility] to return the call.

## 2014-07-29 NOTE — Telephone Encounter (Signed)
Patient's daughter, Jenelle Mages @ [phone] questioning if patient is suppose to switch from Rx  Memantine HCl ER (NAMENDA XR) 28 MG CP24 and Memantine HCl ER (NAMENDA XR) 28 MG CP24 to daily Namzaric.  Also questioning if there's anything else they could be doing for patient.  Please call and advise.

## 2014-07-29 NOTE — Telephone Encounter (Signed)
Gave verbal to [place] with [facility].

## 2014-08-02 ENCOUNTER — Telehealth: Payer: Self-pay | Admitting: Family

## 2014-08-02 NOTE — Telephone Encounter (Signed)
Caller name: Malorie from [facility] Relation to pt: Call back number: [phone] Pharmacy:  Reason for call:   Needs verbal order for OT to see her once a week for one week followed by twice a week for four weeks.

## 2014-08-02 NOTE — Telephone Encounter (Signed)
I spoke to daugher on Friday last.  Relayed that pt should be taking only the namric, since contains both the aricept and namenda.  She verbalized understanding.   She asked about other things that may help with alztheimers.  Light or art therapy?  Other ?

## 2014-08-02 NOTE — Telephone Encounter (Signed)
Verbal ok given and plan of care will be faxed to office for provider to sign.

## 2014-08-03 NOTE — Telephone Encounter (Signed)
Caller name: Annabelle Harman from [place]  Relation to pt: Physical Therapist  Call back number: [phone]    Reason for call:   Physical Therapist requesting a verbal order for physical therapy. Working on Armed forces technical officer and home exercising

## 2014-08-03 NOTE — Telephone Encounter (Signed)
Verbal authorization given to PT.

## 2014-08-04 NOTE — Telephone Encounter (Signed)
LMVM for [name] on her protected VM of that information below relating to stimulating memory pt like the [facility].  She is to call back if questions.

## 2014-08-04 NOTE — Telephone Encounter (Signed)
namziri cis namenda 28 mg daily and aricept at 10 mg daily. All in one.   There are cognitive daycare facilities that stimulate and integrate memory patients , [facility] for example.

## 2014-08-12 ENCOUNTER — Telehealth: Payer: Self-pay | Admitting: *Deleted

## 2014-08-12 NOTE — Telephone Encounter (Signed)
Received home health certification and plan of care via fax from [facility]. Forwarded to [employer]. JG//CMA

## 2014-08-16 NOTE — Telephone Encounter (Signed)
Forms reviewed and signed by [facility].  Faxed forms and fax confirmation received.  Forms labeled and placed in scan basket for scanning.

## 2014-08-17 ENCOUNTER — Telehealth: Payer: Self-pay

## 2014-08-17 NOTE — Telephone Encounter (Signed)
Received via fax.  Forms placed in Dr. Charlett Blake red folder for review and signature.

## 2014-08-23 ENCOUNTER — Ambulatory Visit (HOSPITAL_BASED_OUTPATIENT_CLINIC_OR_DEPARTMENT_OTHER)
Admission: RE | Admit: 2014-08-23 | Discharge: 2014-08-23 | Disposition: A | Discharge status: Home / Self Care | Payer: Medicare Other | Source: Ambulatory Visit | Attending: Family | Admitting: Family

## 2014-08-23 ENCOUNTER — Ambulatory Visit (INDEPENDENT_AMBULATORY_CARE_PROVIDER_SITE_OTHER): Payer: Medicare Other | Admitting: Family

## 2014-08-23 VITALS — BP 100/80 | Ht 59.5 in

## 2014-08-23 DIAGNOSIS — F0390 Unspecified dementia without behavioral disturbance: Secondary | ICD-10-CM

## 2014-08-23 DIAGNOSIS — J189 Pneumonia, unspecified organism: Secondary | ICD-10-CM

## 2014-08-23 DIAGNOSIS — G309 Alzheimer's disease, unspecified: Secondary | ICD-10-CM

## 2014-08-23 DIAGNOSIS — F068 Other specified mental disorders due to known physiological condition: Secondary | ICD-10-CM

## 2014-08-23 DIAGNOSIS — J9811 Atelectasis: Secondary | ICD-10-CM | POA: Diagnosis not present

## 2014-08-23 DIAGNOSIS — J690 Pneumonitis due to inhalation of food and vomit: Secondary | ICD-10-CM

## 2014-08-23 DIAGNOSIS — L89102 Pressure ulcer of unspecified part of back, stage 2: Secondary | ICD-10-CM

## 2014-08-23 DIAGNOSIS — I10 Essential (primary) hypertension: Secondary | ICD-10-CM

## 2014-08-23 DIAGNOSIS — L89159 Pressure ulcer of sacral region, unspecified stage: Secondary | ICD-10-CM

## 2014-08-23 DIAGNOSIS — F0280 Dementia in other diseases classified elsewhere without behavioral disturbance: Secondary | ICD-10-CM

## 2014-08-23 MED ORDER — OXYBUTYNIN CHLORIDE 5 MG PO TABS: 5.0000 mg | ORAL_TABLET | Freq: Three times a day (TID) | ORAL | Status: DC

## 2014-08-23 MED ORDER — LISINOPRIL 2.5 MG PO TABS: 2.5000 mg | ORAL_TABLET | Freq: Every day | ORAL | Status: DC

## 2014-08-23 MED ORDER — PANTOPRAZOLE SODIUM 40 MG PO TBEC: 40.0000 mg | DELAYED_RELEASE_TABLET | Freq: Every day | ORAL | Status: DC

## 2014-08-23 MED ORDER — DONEPEZIL HCL 10 MG PO TABS: 10.0000 mg | ORAL_TABLET | Freq: Every day | ORAL | Status: DC

## 2014-08-23 MED ORDER — ALBUTEROL SULFATE HFA 108 (90 BASE) MCG/ACT IN AERS: 2.0000 | INHALATION_SPRAY | RESPIRATORY_TRACT | Status: DC | PRN

## 2014-08-23 MED ORDER — CLONIDINE HCL 0.1 MG PO TABS: 0.1000 mg | ORAL_TABLET | Freq: Every day | ORAL | Status: DC

## 2014-08-23 MED ORDER — POTASSIUM CHLORIDE CRYS ER 20 MEQ PO TBCR: 20.0000 meq | EXTENDED_RELEASE_TABLET | Freq: Every day | ORAL | Status: DC

## 2014-08-23 MED ORDER — CITALOPRAM HYDROBROMIDE 20 MG PO TABS: 20.0000 mg | ORAL_TABLET | Freq: Every day | ORAL | Status: DC

## 2014-08-23 NOTE — Progress Notes (Signed)
Subjective:    Patient ID: [name], female    DOB: 01/28/1931, 82 y.o.   MRN: [id number]  HPI  Patient here for hospital follow up. She was admitted to the hospital on 12/22 with CAP. She was then transferred to [facility] skilled facility where she stayed for 21 days. She has been home for about 2 weeks now and has PT/OT/RN coming to the house.  She is brought here today by her daughter who who is in from [place].  The patient's husband of 68 years is hospitalized with sepsis and cholecystitis.  He has been her primary care giver.  The daughter is interested in knowing how to employ resources such as CNA through insurance to help with her mom's care.    Daughter notes that she is particularly concerned about her mother's diet. Daughter reports that the patient likes sugar/carbs toast/jelly, not getting enough protein.    She also notes that the patient complains of sacral pain (she spends most of her afternoon in a poorly padded recliner). The patient does not wish to be here today.   HTN- the daughter brings OT blood pressure reading from earlier today- 140/80- pt did not allow [place] to complete her vitals today.   Dementia- this is a worsening problem for this patient.   Past Medical History  Diagnosis Date  . Depressive disorder, not elsewhere classified   . Anxiety state, unspecified   . Benign neoplasm of colon   . Diverticulitis of colon (without mention of hemorrhage)   . Mixed hyperlipidemia   . Obesity, unspecified   . Unspecified essential hypertension   . Chronic rhinitis   . GERD   . INTERTRIGO, CANDIDAL   . Major neurocognitive disorder due to Alzheimer's disease, probable, without behavioral disturbance     Followed by Dr. Brett Fairy at [facility].   Marland Kitchen PRESSURE ULCER STAGE I     sacral, recurrent  . Arthritis     Review of Systems  Respiratory: Negative for cough.    See HPI  Past Medical History  Diagnosis Date  . Depressive disorder, not  elsewhere classified   . Anxiety state, unspecified   . Benign neoplasm of colon   . Diverticulitis of colon (without mention of hemorrhage)   . Mixed hyperlipidemia   . Obesity, unspecified   . Unspecified essential hypertension   . Chronic rhinitis   . GERD   . INTERTRIGO, CANDIDAL   . Major neurocognitive disorder due to Alzheimer's disease, probable, without behavioral disturbance     Followed by Dr. Brett Fairy at [facility].   Marland Kitchen PRESSURE ULCER STAGE I     sacral, recurrent  . Arthritis     History   Social History  . Marital Status: Married    Spouse Name: Quillian Quince    Number of Children: 6  . Years of Education: 12   Occupational History  . Housewife    Social History Main Topics  . Smoking status: Former Smoker -- 0.20 packs/day for 2 years    Types: Cigarettes    Quit date: 07/16/1958  . Smokeless tobacco: Never Used     Comment: stopped 67 years ago  . Alcohol Use: No  . Drug Use: No  . Sexual Activity: Not on file   Other Topics Concern  . Not on file   Social History Narrative   Patient is married Quillian Quince) and lives with her husband.   Patient is a homemaker.   Patient has 6 children.  Patient is right-handed.   Patient has a high school education.   Patient does not drink any caffeine.    Past Surgical History  Procedure Laterality Date  . Cholecystectomy    . Total hip arthroplasty Bilateral 1991, 2001  . Vesicovaginal fistula closure w/ tah    . Rotator cuff repair    . Abdominal hysterectomy  1980  . Cesarean section      Family History  Problem Relation Age of Onset  . Breast cancer Mother   . Colon cancer Mother     Allergies  Allergen Reactions  . Penicillins     REACTION: adolesent    Current Outpatient Prescriptions on File Prior to Visit  Medication Sig Dispense Refill  . albuterol (PROVENTIL HFA;VENTOLIN HFA) 108 (90 BASE) MCG/ACT inhaler Inhale 2 puffs into the lungs every 4 (four) hours as needed for wheezing or  shortness of breath.    . ALPRAZolam (XANAX) 0.25 MG tablet Take 1 tablet (0.25 mg total) by mouth every 8 (eight) hours as needed. 60 tablet 2  . ALPRAZolam (XANAX) 0.25 MG tablet Take 1 tablet (0.25 mg total) by mouth at bedtime as needed for anxiety or sleep. 30 tablet 0  . aspirin 325 MG EC tablet Take 325 mg by mouth daily.      . citalopram (CELEXA) 20 MG tablet Take 20 mg by mouth daily.    . cloNIDine (CATAPRES) 0.1 MG tablet TAKE ONE TABLET BY MOUTH ONCE DAILY 90 tablet 1  . donepezil (ARICEPT) 10 MG tablet Take 1 tablet (10 mg total) by mouth daily. 30 tablet 0  . famotidine (PEPCID) 20 MG tablet Take 20 mg by mouth at bedtime.      Marland Kitchen guaiFENesin (MUCINEX) 600 MG 12 hr tablet Take 1 tablet (600 mg total) by mouth 2 (two) times daily. 30 tablet 0  . levofloxacin (LEVAQUIN) 750 MG tablet Take 1 tablet (750 mg total) by mouth every other day. 3 tablet 0  . lisinopril (PRINIVIL,ZESTRIL) 2.5 MG tablet Take 1 tablet (2.5 mg total) by mouth daily. 30 tablet 0  . Memantine HCl ER (NAMENDA XR) 28 MG CP24 Take 1 capsule by mouth daily.    . mometasone (NASONEX) 50 MCG/ACT nasal spray Place 1-2 sprays into the nose daily.    . Multiple Vitamin (MULTIVITAMIN WITH MINERALS) TABS tablet Take 1 tablet by mouth daily.    Marland Kitchen oxybutynin (DITROPAN) 5 MG tablet Take 5 mg by mouth 3 (three) times daily.    . pantoprazole (PROTONIX) 40 MG tablet Take 1 tablet (40 mg total) by mouth daily before breakfast. 90 tablet 3  . potassium chloride SA (KLOR-CON M20) 20 MEQ tablet Take 1 tablet (20 mEq total) by mouth daily. 90 tablet 0  . [DISCONTINUED] oxybutynin (DITROPAN) 5 MG tablet Take 1 tablet (5 mg total) by mouth 3 (three) times daily. 270 tablet 3   No current facility-administered medications on file prior to visit.    Ht 4' 11.5" (1.511 m)       Objective:    Physical Exam  Constitutional: She appears well-developed and well-nourished. No distress.  Cardiovascular: Normal rate and regular rhythm.    No murmur heard. Pulmonary/Chest: Effort normal and breath sounds normal. No respiratory distress. She has no wheezes. She has no rales. She exhibits no tenderness.  Neurological:  confused  Skin: Skin is warm and dry.  Stage 1 sacral wound, with small early stage 2 in center.   Psychiatric: Her affect is inappropriate. Her speech  is not rapid and/or pressured. She is agitated. Cognition and memory are impaired.    There were no vitals taken for this visit. Wt Readings from Last 3 Encounters:  07/06/14 156 lb 12 oz (71.1 kg)  07/02/14 156 lb 9.6 oz (71.033 kg)  06/16/14 158 lb 12.8 oz (72.031 kg)     Lab Results  Component Value Date   WBC 8.1 07/06/2014   HGB 13.6 07/06/2014   HCT 39.8 07/06/2014   PLT 207 07/06/2014   GLUCOSE 69* 07/05/2014   ALT 5 07/03/2014   AST 15 07/03/2014   NA 141 07/05/2014   K 5.0 07/05/2014   CL 103 07/05/2014   CREATININE 0.96 07/05/2014   BUN 16 07/05/2014   CO2 24 07/05/2014   TSH 3.580 07/03/2014    Dg Chest 2 View  07/02/2014   CLINICAL DATA:  Confusion. Positive pulmonary crackles on evaluation.  EXAM: CHEST  2 VIEW  COMPARISON:  CT appears at 04/30/2014  FINDINGS: Stable cardiac and mediastinal contours. Markedly low lung volumes. Small left pleural effusion. Underlying left lung base consolidative pulmonary opacities. Perihilar interstitial pulmonary opacities. No definite large pneumothorax. Limited visualization of the osseous skeleton.  IMPRESSION: Markedly limited evaluation.  Low lung volumes.  Suggestion of small left pleural effusion with underlying consolidative opacities representing atelectasis or infection. Infection not excluded.  Perihilar interstitial pulmonary opacities represent pulmonary vascular redistribution versus mild interstitial pulmonary edema.   Electronically Signed   By: Lovey Newcomer M.D.   On: 07/02/2014 18:01       Assessment & Plan:   Problem List Items Addressed This Visit    None        Nance Pear., NP

## 2014-08-23 NOTE — Assessment & Plan Note (Signed)
Gave daughter an rx for a

## 2014-08-23 NOTE — Patient Instructions (Addendum)
Please complete chest x ray on the first floor. Try adding ensure, one can twice daily. Apply gel pad to chair and to bed to prevent further skin break down. Stop pepcid.  Follow up in 6 weeks.

## 2014-08-26 ENCOUNTER — Encounter: Payer: Self-pay | Admitting: Family

## 2014-08-26 NOTE — Assessment & Plan Note (Signed)
Resolved. Follow up CXR improved.

## 2014-08-26 NOTE — Assessment & Plan Note (Signed)
Home readings have been good. We were unable to get a good reading today in the office and did not wish for [place] to retake BP.

## 2014-08-26 NOTE — Assessment & Plan Note (Signed)
She is followed by Dr. Brett Fairy at [facility]. She is now requiring 24 hour supervision. I did discuss with the daughter that in the absence of skilled nursing needs, PT/OT, there is very little that insurance will pay for.  CNA's are generally an out of pocket expense.  We did discuss that the patient may qualify for hospice and they have some additional resources. She will discuss with her sister who is HCPOA and let me know if they wish to proceed with the referral to hospice.

## 2014-08-26 NOTE — Assessment & Plan Note (Signed)
Gave rx for gel pad for chair and bed.  Daughter will continue to use moisture barrrier.

## 2014-09-14 NOTE — Telephone Encounter (Signed)
Forms faxed back.

## 2014-10-06 ENCOUNTER — Encounter: Payer: Self-pay | Admitting: Family

## 2014-10-06 ENCOUNTER — Ambulatory Visit (INDEPENDENT_AMBULATORY_CARE_PROVIDER_SITE_OTHER): Payer: Medicare Other | Admitting: Family

## 2014-10-06 VITALS — BP 100/70 | HR 82 | Temp 98.0°F | Resp 16 | Ht 59.5 in | Wt 158.8 lb

## 2014-10-06 DIAGNOSIS — N3281 Overactive bladder: Secondary | ICD-10-CM

## 2014-10-06 DIAGNOSIS — I10 Essential (primary) hypertension: Secondary | ICD-10-CM | POA: Diagnosis not present

## 2014-10-06 DIAGNOSIS — G309 Alzheimer's disease, unspecified: Secondary | ICD-10-CM | POA: Diagnosis not present

## 2014-10-06 DIAGNOSIS — L8992 Pressure ulcer of unspecified site, stage 2: Secondary | ICD-10-CM | POA: Diagnosis not present

## 2014-10-06 DIAGNOSIS — F02A18 Dementia in other diseases classified elsewhere, mild, with other behavioral disturbance: Secondary | ICD-10-CM

## 2014-10-06 DIAGNOSIS — F0281 Dementia in other diseases classified elsewhere with behavioral disturbance: Secondary | ICD-10-CM

## 2014-10-06 DIAGNOSIS — F09 Unspecified mental disorder due to known physiological condition: Secondary | ICD-10-CM

## 2014-10-06 MED ORDER — ALPRAZOLAM 0.25 MG PO TABS: ORAL_TABLET | ORAL | Status: DC

## 2014-10-06 MED ORDER — NYSTATIN 100000 UNIT/GM EX CREA: 1.0000 "application " | TOPICAL_CREAM | Freq: Two times a day (BID) | CUTANEOUS | Status: DC

## 2014-10-06 MED ORDER — MEMANTINE HCL-DONEPEZIL HCL ER 28-10 MG PO CP24: 1.0000 | ORAL_CAPSULE | Freq: Every day | ORAL | Status: DC

## 2014-10-06 MED ORDER — POTASSIUM CHLORIDE CRYS ER 20 MEQ PO TBCR: 20.0000 meq | EXTENDED_RELEASE_TABLET | Freq: Every day | ORAL | Status: AC

## 2014-10-06 NOTE — Patient Instructions (Signed)
Apply nystatin cream to sacral area twice daily. Apply moisture barrier ointment with each change of depends and as needed. Apply gel overly to chair and mattress. Follow up in 1 month, sooner if problems/ concerns.

## 2014-10-06 NOTE — Progress Notes (Signed)
Pre visit review using our clinic review tool, if applicable. No additional management support is needed unless otherwise documented below in the visit note. 

## 2014-10-06 NOTE — Progress Notes (Signed)
Subjective:    Patient ID: [name], female    DOB: Nov 15, 1930, 81 y.o.   MRN: [id number]  HPI   [name] is an 81 yr old female with demenia who presents today with her daughter and husband who are primary care givers.  Family reports that pt becomes aggitated at times and sometimes also has some hallucinations.   HTN- Patient is currently maintained on the following medications for blood pressure: catapres, lisinopril. Last 3 blood pressure readings in our office are as follows: BP Readings from Last 3 Encounters:  10/06/14 100/70  08/23/14 100/80  07/06/14 127/59   Sacral decub- + incontinence. Family notes that she often sits in the same chair.    Using miralax which is helping with constipation  Pt Denies pain.        Review of Systems See HPI  Past Medical History  Diagnosis Date  . Depressive disorder, not elsewhere classified   . Anxiety state, unspecified   . Benign neoplasm of colon   . Diverticulitis of colon (without mention of hemorrhage)   . Mixed hyperlipidemia   . Obesity, unspecified   . Unspecified essential hypertension   . Chronic rhinitis   . GERD   . INTERTRIGO, CANDIDAL   . Major neurocognitive disorder due to Alzheimer's disease, probable, without behavioral disturbance     Followed by Dr. Brett [name] at [facility].   Marland Kitchen PRESSURE ULCER STAGE I     sacral, recurrent  . Arthritis     History   Social History  . Marital Status: Married    Spouse Name: [name]  . Number of Children: 6  . Years of Education: 12   Occupational History  . Housewife    Social History Main Topics  . Smoking status: Former Smoker -- 0.20 packs/day for 2 years    Types: Cigarettes    Quit date: 07/16/1958  . Smokeless tobacco: Never Used     Comment: stopped 67 years ago  . Alcohol Use: No  . Drug Use: No  . Sexual Activity: Not on file   Other Topics Concern  . Not on file   Social History Narrative   Patient is married [name]) and lives with her husband.   Patient is a homemaker.   Patient has 6 children.   Patient is right-handed.   Patient has a high school education.   Patient does not drink any caffeine.    Past Surgical History  Procedure Laterality Date  . Cholecystectomy    . Total hip arthroplasty Bilateral 1991, 2001  . Vesicovaginal fistula closure w/ tah    . Rotator cuff repair    . Abdominal hysterectomy  1980  . Cesarean section      Family History  Problem Relation Age of Onset  . Breast cancer Mother   . Colon cancer Mother     Allergies  Allergen Reactions  . Penicillins     REACTION: adolesent    Current Outpatient Prescriptions on File Prior to Visit  Medication Sig Dispense Refill  . albuterol (PROVENTIL HFA;VENTOLIN HFA) 108 (90 BASE) MCG/ACT inhaler Inhale 2 puffs into the lungs every 4 (four) hours as needed for wheezing or shortness of breath. 1 Inhaler 2  . aspirin 325 MG EC tablet Take 325 mg by mouth daily.      . citalopram (CELEXA) 20 MG tablet Take 1 tablet (20 mg total) by mouth daily. 90 tablet 1  . cloNIDine (CATAPRES) 0.1 MG tablet Take  1 tablet (0.1 mg total) by mouth daily. 90 tablet 1  . lisinopril (PRINIVIL,ZESTRIL) 2.5 MG tablet Take 1 tablet (2.5 mg total) by mouth daily. 90 tablet 1  . mometasone (NASONEX) 50 MCG/ACT nasal spray Place 1-2 sprays into the nose daily.    . Multiple Vitamin (MULTIVITAMIN WITH MINERALS) TABS tablet Take 1 tablet by mouth daily.    Marland Kitchen oxybutynin (DITROPAN) 5 MG tablet Take 1 tablet (5 mg total) by mouth 3 (three) times daily. 270 tablet 1  . pantoprazole (PROTONIX) 40 MG tablet Take 1 tablet (40 mg total) by mouth daily before breakfast. 90 tablet 1  . [DISCONTINUED] oxybutynin (DITROPAN) 5 MG tablet Take 1 tablet (5 mg total) by mouth 3 (three) times daily. 270 tablet 3   No current facility-administered medications on file prior to visit.    BP 100/70 mmHg  Pulse 82  Temp(Src) 98 F (36.7 C) (Oral)  Resp 16  Ht  4' 11.5" (1.511 m)  Wt 158 lb 12.8 oz (72.031 kg)  BMI 31.55 kg/m2  SpO2 97%       Objective:   Physical Exam  Constitutional: She appears well-developed and well-nourished.  Cardiovascular: Normal rate, regular rhythm and normal heart sounds.   No murmur heard. Pulmonary/Chest: Effort normal and breath sounds normal. No respiratory distress. She has no wheezes.  Neurological: She is alert.  Skin:  + erythema buttocks- stage 1 sacral ulcer, small amount of skin breakdown overlying coccyx- dime sized stage 2   Psychiatric:  Suspicious affect          Assessment & Plan:

## 2014-10-07 ENCOUNTER — Telehealth: Payer: Self-pay | Admitting: Family

## 2014-10-07 NOTE — Telephone Encounter (Signed)
Left message on voicemail to return my call.  

## 2014-10-07 NOTE — Telephone Encounter (Signed)
Please contact pt's daughter and let her know that I reviewed [name]'s meds and I see that she is already oxybutinin for overactive bladder along with macrodantin (antibiotic).  I would recommend that she discontinue oral estrogen as I doubt that it is helping much with her urinary incontinence but does come with significant risk at her age.

## 2014-10-10 DIAGNOSIS — N3281 Overactive bladder: Secondary | ICD-10-CM | POA: Insufficient documentation

## 2014-10-10 NOTE — Assessment & Plan Note (Signed)
Pt is already on ditropan and macrodantin.  She is also on estrogen.  Daughter is requesting refill on oral estrogen.  I advised daughter that her age I do not recommend ongoing oral estrogen due to increased risk and I am not sure that she is really getting much benefit. Advised d/c estrogen.

## 2014-10-10 NOTE — Assessment & Plan Note (Signed)
Advised that they continue to use moisture barrier cream after each diaper change.  Apply nystatin bid, hand written rx provided for gel pad for chair and gel overlay mattress cover.  Advised frequent position changes.

## 2014-10-10 NOTE — Assessment & Plan Note (Signed)
Stable, continue current meds 

## 2014-10-10 NOTE — Assessment & Plan Note (Signed)
Followed by neurology. Rx provided for xanax prn aggitation.

## 2014-10-12 NOTE — Telephone Encounter (Signed)
Called the patients family informed De Nurse of PCP instructions.  He stated the estrogen had been discontinued by previous primary care physician as he would not refill, informed the family  to seek an OBGYN for that medication.  Patient currently does not take an oral estrogen.

## 2014-11-10 ENCOUNTER — Ambulatory Visit (INDEPENDENT_AMBULATORY_CARE_PROVIDER_SITE_OTHER): Payer: Medicare Other | Admitting: Family

## 2014-11-10 ENCOUNTER — Encounter: Payer: Self-pay | Admitting: Family

## 2014-11-10 VITALS — BP 104/70 | HR 76 | Temp 98.0°F | Resp 18 | Ht 59.5 in | Wt 160.4 lb

## 2014-11-10 DIAGNOSIS — H1010 Acute atopic conjunctivitis, unspecified eye: Secondary | ICD-10-CM | POA: Diagnosis not present

## 2014-11-10 DIAGNOSIS — F0281 Dementia in other diseases classified elsewhere with behavioral disturbance: Secondary | ICD-10-CM

## 2014-11-10 DIAGNOSIS — G309 Alzheimer's disease, unspecified: Secondary | ICD-10-CM | POA: Diagnosis not present

## 2014-11-10 DIAGNOSIS — Z23 Encounter for immunization: Secondary | ICD-10-CM

## 2014-11-10 DIAGNOSIS — L8991 Pressure ulcer of unspecified site, stage 1: Secondary | ICD-10-CM

## 2014-11-10 DIAGNOSIS — F09 Unspecified mental disorder due to known physiological condition: Secondary | ICD-10-CM

## 2014-11-10 DIAGNOSIS — L89151 Pressure ulcer of sacral region, stage 1: Secondary | ICD-10-CM

## 2014-11-10 DIAGNOSIS — F02A18 Dementia in other diseases classified elsewhere, mild, with other behavioral disturbance: Secondary | ICD-10-CM

## 2014-11-10 MED ORDER — ALPRAZOLAM 0.25 MG PO TABS: ORAL_TABLET | ORAL | Status: DC

## 2014-11-10 NOTE — Patient Instructions (Addendum)
Try Opcon A eye allergy otc (Bepreve not covered by insurance) You may use xanax as needed for aggitation/insomnia at night.  Follow up in 3 months.

## 2014-11-10 NOTE — Progress Notes (Signed)
Subjective:    Patient ID: [name], female    DOB: March 03, 1931, 81 y.o.   MRN: [id number]  HPI  [name] is an 81 yr old female who presents today for follow up of her sacral pressure ulcer.  Last visit it was recommended that the continue moisture barrier cream, add bid nystatin, and apply a gel pat to her chair and mattress as well as frequent position changes.   Daughter reports inflammation has improved.    Seeing things at night "people in the ceiling from her past.  Rainwater coming in the window. nnot sleeping well.    Eyes watering- daughter has been using [place] which she had on hand. Requests rx for pt.   Review of Systems See HPI  Past Medical History  Diagnosis Date  . Depressive disorder, not elsewhere classified   . Anxiety state, unspecified   . Benign neoplasm of colon   . Diverticulitis of colon (without mention of hemorrhage)   . Mixed hyperlipidemia   . Obesity, unspecified   . Unspecified essential hypertension   . Chronic rhinitis   . GERD   . INTERTRIGO, CANDIDAL   . Major neurocognitive disorder due to Alzheimer's disease, probable, without behavioral disturbance     Followed by Dr. Brett [name] at [facility].   Marland Kitchen PRESSURE ULCER STAGE I     sacral, recurrent  . Arthritis     History   Social History  . Marital Status: Married    Spouse Name: [name]  . Number of Children: 6  . Years of Education: 12   Occupational History  . Housewife    Social History Main Topics  . Smoking status: Former Smoker -- 0.20 packs/day for 2 years    Types: Cigarettes    Quit date: 07/16/1958  . Smokeless tobacco: Never Used     Comment: stopped 67 years ago  . Alcohol Use: No  . Drug Use: No  . Sexual Activity: Not on file   Other Topics Concern  . Not on file   Social History Narrative   Patient is married [name]) and lives with her husband.   Patient is a homemaker.   Patient has 6 children.   Patient is right-handed.   Patient  has a high school education.   Patient does not drink any caffeine.    Past Surgical History  Procedure Laterality Date  . Cholecystectomy    . Total hip arthroplasty Bilateral 1991, 2001  . Vesicovaginal fistula closure w/ tah    . Rotator cuff repair    . Abdominal hysterectomy  1980  . Cesarean section      Family History  Problem Relation Age of Onset  . Breast cancer Mother   . Colon cancer Mother     Allergies  Allergen Reactions  . Penicillins     REACTION: adolesent    Current Outpatient Prescriptions on File Prior to Visit  Medication Sig Dispense Refill  . albuterol (PROVENTIL HFA;VENTOLIN HFA) 108 (90 BASE) MCG/ACT inhaler Inhale 2 puffs into the lungs every 4 (four) hours as needed for wheezing or shortness of breath. 1 Inhaler 2  . aspirin 325 MG EC tablet Take 325 mg by mouth daily.      . citalopram (CELEXA) 20 MG tablet Take 1 tablet (20 mg total) by mouth daily. 90 tablet 1  . cloNIDine (CATAPRES) 0.1 MG tablet Take 1 tablet (0.1 mg total) by mouth daily. 90 tablet 1  . lisinopril (PRINIVIL,ZESTRIL) 2.5 MG  tablet Take 1 tablet (2.5 mg total) by mouth daily. 90 tablet 1  . Memantine HCl-Donepezil HCl (NAMZARIC) 28-10 MG CP24 Take 1 tablet by mouth daily. 30 capsule   . mometasone (NASONEX) 50 MCG/ACT nasal spray Place 1-2 sprays into the nose daily.    . Multiple Vitamin (MULTIVITAMIN WITH MINERALS) TABS tablet Take 1 tablet by mouth daily.    . nitrofurantoin (MACRODANTIN) 100 MG capsule Take 100 mg by mouth daily.  0  . nystatin cream (MYCOSTATIN) Apply 1 application topically 2 (two) times daily. 30 g 2  . oxybutynin (DITROPAN) 5 MG tablet Take 1 tablet (5 mg total) by mouth 3 (three) times daily. 270 tablet 1  . pantoprazole (PROTONIX) 40 MG tablet Take 1 tablet (40 mg total) by mouth daily before breakfast. 90 tablet 1  . potassium chloride SA (KLOR-CON M20) 20 MEQ tablet Take 1 tablet (20 mEq total) by mouth daily. 90 tablet 1  . [DISCONTINUED]  oxybutynin (DITROPAN) 5 MG tablet Take 1 tablet (5 mg total) by mouth 3 (three) times daily. 270 tablet 3   No current facility-administered medications on file prior to visit.    BP 104/70 mmHg  Pulse 76  Temp(Src) 98 F (36.7 C) (Oral)  Resp 18  Ht 4' 11.5" (1.511 m)  Wt 160 lb 6.4 oz (72.757 kg)  BMI 31.87 kg/m2  SpO2 96%       Objective:   Physical Exam  Constitutional: She appears well-developed and well-nourished.  Cardiovascular: Normal rate, regular rhythm and normal heart sounds.   No murmur heard. Pulmonary/Chest: Effort normal and breath sounds normal. No respiratory distress. She has no wheezes.  Neurological: She is alert.  Confused but pleasant  Skin:  Stage 1 sacral ulcer.   Psychiatric: She has a normal mood and affect. Her behavior is normal. Judgment and thought content normal.          Assessment & Plan:

## 2014-11-10 NOTE — Progress Notes (Signed)
Pre visit review using our clinic review tool, if applicable. No additional management support is needed unless otherwise documented below in the visit note. 

## 2014-11-11 DIAGNOSIS — H1010 Acute atopic conjunctivitis, unspecified eye: Secondary | ICD-10-CM | POA: Insufficient documentation

## 2014-11-11 NOTE — Assessment & Plan Note (Signed)
While I think her behavioral disturbances would be helped by an antipsychotic, I think side effect risks outweigh the benefit.  Will provide refill for low dose xanax to be used PRN. The family understands that this may increase risk of falls and wishes use on a prn basis.

## 2014-11-11 NOTE — Assessment & Plan Note (Signed)
Discussed use of otc antihistamine drops as bereva is not covered by her insurance.

## 2014-11-11 NOTE — Assessment & Plan Note (Signed)
Improved. Gave another rx to family for gel pad for bed and chair- they will try to get. Continue nystatin, frequent position change.

## 2014-11-29 ENCOUNTER — Telehealth: Payer: Self-pay | Admitting: Neurology

## 2014-11-29 MED ORDER — MEMANTINE HCL-DONEPEZIL HCL ER 28-10 MG PO CP24: 1.0000 | ORAL_CAPSULE | Freq: Every day | ORAL | Status: DC

## 2014-11-29 NOTE — Telephone Encounter (Signed)
Rx has been sent.  Receipt confirmed by pharmacy.  I called back to advise.  They are aware.  

## 2014-11-29 NOTE — Telephone Encounter (Signed)
Pts husband called and requested a refill of Rx. NAMZARIC to be sent to [facility] [place],  - [address]. The patient has been on samples of the medication for the last few months. Please call and advise.

## 2014-11-30 ENCOUNTER — Telehealth: Payer: Self-pay | Admitting: Neurology

## 2014-11-30 NOTE — Telephone Encounter (Signed)
[employer] has approved the request for coverage on Namzaric effective until 11/30/2015 Ref # [id number]

## 2014-11-30 NOTE — Telephone Encounter (Signed)
[name] from [facility] called to say the Rx.  NAMZARIC had been approved and was good a year. If you have any questions she can be reach @ [phone].

## 2014-12-16 ENCOUNTER — Encounter: Payer: Self-pay | Admitting: Neurology

## 2014-12-16 ENCOUNTER — Ambulatory Visit (INDEPENDENT_AMBULATORY_CARE_PROVIDER_SITE_OTHER): Payer: Medicare Other | Admitting: Neurology

## 2014-12-16 VITALS — BP 118/60 | HR 74 | Resp 20 | Ht 60.5 in | Wt 164.5 lb

## 2014-12-16 DIAGNOSIS — F0280 Dementia in other diseases classified elsewhere without behavioral disturbance: Secondary | ICD-10-CM

## 2014-12-16 DIAGNOSIS — F22 Delusional disorders: Secondary | ICD-10-CM

## 2014-12-16 DIAGNOSIS — G309 Alzheimer's disease, unspecified: Secondary | ICD-10-CM | POA: Diagnosis not present

## 2014-12-16 DIAGNOSIS — F0282 Dementia in other diseases classified elsewhere, unspecified severity, with psychotic disturbance: Secondary | ICD-10-CM

## 2014-12-16 MED ORDER — MEMANTINE HCL-DONEPEZIL HCL ER 28-10 MG PO CP24: 1.0000 | ORAL_CAPSULE | Freq: Every day | ORAL | Status: AC

## 2014-12-16 NOTE — Progress Notes (Signed)
[facility]  PATIENT: [name] DOB: Nov 14, 1930   REASON FOR VISIT: follow up HISTORY FROM: patient  HISTORY OF PRESENT ILLNESS:   Patient is meanwhile 81 years old and has declined in her cognitive status further and further over the last 4 years,  MMSE 06-16-14 was 8 points,  The patient is drowsy - day and night , her husband is her main care taker and is exhausted. They also have financial restraints.  Her husband has insomnia and is now taking [place] at age 12- he never tried melatonin .  Her Namenda XR 28 mg is 148 USD. I will arrange for company support,  patient assistance. The patient is unable to answer questions correctly .  She believes she is in [place], [place]. She did for 40 years, than moved to [place] and then to [place], [place].  Her husband was in the army.   Aricept 10 mg daily to continue and 28 mg namenda- changed  To  Daily Namzaric - free samples.  Follow up in 6 month with NP alternating with me  Vst time 45 min.  EEG with next RV     05-11-11, Ms. [name], 81 yrs old returns for follow-up. She was last seen June 21/2010. Pt reports memory a little worse. Increasing difficulties with word finding. Continues to be independent in ADL's. No longer cooks, not driven in 6 yrs.  She is currently  on Aricept and Namenda. Her MMT today shows a continued decline  from 23 to 15 points. Her AFT is reduced to 4 from 7. Pt and husband deny any behavior issues, appetite is stable, sleeping well. Denies hallucinations. No new neurolpgic complaints. See ROS. Rv for progressive dementia, gait disorder, poverty of speech. uses walker. MMSE is 14 /30 today, AFT only 2 names, cannot abstract- the apple doesn't fall far from the tree- "  what does this mean - "nobody gets hurt". has hallucinations, believes there is a  woman in her husbands bed. insistent. He noted  nightly talking and sometimes whole conversations , sometimes mumbling. her cognitive  decline is evident as is her physical decline - she denies changes in eating habits and her husband mentioned her apetite increased 14 days ago.  He is interested in another dementia medication and asked explicitly about Alzheimer's research.  9-4 -13 Rv for dementia follow up, todays MMSe is 10 out of 30 points.  she is off aricept and now on exelon patches ,  and she continued on namenda  acc. to my records at [facility], but husband was unsure.  Long medication list. 06-09-13 (LL):  Patient is here for dementia follow up, she needs assistance with all IADL's and ADLs, has poverty of speech, cannot contemplate abstract expressions or recall past a minute or so.   Husband cannot afford Namenda anymore, and would like to switch from Exelon patches to Donepizil to save money.  MMSE 12/30, AFT only 1, Clock drawing 0/4. Uses rolling walker at all times, denies falls.  FAST score.   12-16-14 Mrs. Stonesifer is seen here today in a revisit she has been followed in this office for memory loss and today's Mini-Mental Status Examination revealed 9 out of 30 points. Been followed for the last 6 years here and there has been a progressive decline in her cognitive abilities. Significant is also that she had poverty of speech, this restricts her ability to converse or converse fluently about take in many social activities. Namenda was very well  tolerated. He will need a refill.  She has forgotten meals, and now e gets the food delivered. She started coloring, she has a designated time to do activities. Her cognition has declined, her agitation is less on namenda.       REVIEW OF SYSTEMS: Full 14 system review of systems performed and notable only for: memory loss, confusion. MMSE 06-16-14 was 8 points,  , today 9 points.  Namenda  XR -with patient  assistance.     The patient is unable to answer questions correctly .    ALLERGIES: Allergies  Allergen Reactions  . Penicillins     REACTION: adolesent    HOME  MEDICATIONS: Outpatient Prescriptions Prior to Visit  Medication Sig Dispense Refill  . albuterol (PROVENTIL HFA;VENTOLIN HFA) 108 (90 BASE) MCG/ACT inhaler Inhale 2 puffs into the lungs every 4 (four) hours as needed for wheezing or shortness of breath. 1 Inhaler 2  . ALPRAZolam (XANAX) 0.25 MG tablet Take one table by mouth 3 times daily as needed for agitation or insomnia. 30 tablet 0  . aspirin 325 MG EC tablet Take 325 mg by mouth daily.      . citalopram (CELEXA) 20 MG tablet Take 1 tablet (20 mg total) by mouth daily. 90 tablet 1  . cloNIDine (CATAPRES) 0.1 MG tablet Take 1 tablet (0.1 mg total) by mouth daily. 90 tablet 1  . lisinopril (PRINIVIL,ZESTRIL) 2.5 MG tablet Take 1 tablet (2.5 mg total) by mouth daily. 90 tablet 1  . Memantine HCl-Donepezil HCl (NAMZARIC) 28-10 MG CP24 Take 1 tablet by mouth daily. 30 capsule 3  . mometasone (NASONEX) 50 MCG/ACT nasal spray Place 1-2 sprays into the nose daily.    . Multiple Vitamin (MULTIVITAMIN WITH MINERALS) TABS tablet Take 1 tablet by mouth daily.    . nitrofurantoin (MACRODANTIN) 100 MG capsule Take 100 mg by mouth daily.  0  . nystatin cream (MYCOSTATIN) Apply 1 application topically 2 (two) times daily. 30 g 2  . oxybutynin (DITROPAN) 5 MG tablet Take 1 tablet (5 mg total) by mouth 3 (three) times daily. 270 tablet 1  . pantoprazole (PROTONIX) 40 MG tablet Take 1 tablet (40 mg total) by mouth daily before breakfast. 90 tablet 1  . potassium chloride SA (KLOR-CON M20) 20 MEQ tablet Take 1 tablet (20 mEq total) by mouth daily. 90 tablet 1  . trimethoprim (TRIMPEX) 100 MG tablet Take 100 mg by mouth daily.  4   No facility-administered medications prior to visit.    PAST MEDICAL HISTORY: Past Medical History  Diagnosis Date  . Depressive disorder, not elsewhere classified   . Anxiety state, unspecified   . Benign neoplasm of colon   . Diverticulitis of colon (without mention of hemorrhage)   . Mixed hyperlipidemia   . Obesity,  unspecified   . Unspecified essential hypertension   . Chronic rhinitis   . GERD   . INTERTRIGO, CANDIDAL   . Major neurocognitive disorder due to Alzheimer's disease, probable, without behavioral disturbance     Followed by Dr. Brett Fairy at [facility].   Marland Kitchen PRESSURE ULCER STAGE I     sacral, recurrent  . Arthritis   . Bladder infection     PAST SURGICAL HISTORY: Past Surgical History  Procedure Laterality Date  . Cholecystectomy    . Total hip arthroplasty Bilateral 1991, 2001  . Vesicovaginal fistula closure w/ tah    . Rotator cuff repair    . Abdominal hysterectomy  1980  . Cesarean section  FAMILY HISTORY: Family History  Problem Relation Age of Onset  . Breast cancer Mother   . Colon cancer Mother     SOCIAL HISTORY: History   Social History  . Marital Status: Married    Spouse Name: Quillian Quince  . Number of Children: 6  . Years of Education: 12   Occupational History  . Housewife    Social History Main Topics  . Smoking status: Former Smoker -- 0.20 packs/day for 2 years    Types: Cigarettes    Quit date: 07/16/1958  . Smokeless tobacco: Never Used     Comment: stopped 67 years ago  . Alcohol Use: No  . Drug Use: No  . Sexual Activity: Not on file   Other Topics Concern  . Not on file   Social History Narrative   Patient is married Quillian Quince) and lives with her husband.   Patient is a homemaker.   Patient has 6 children.   Patient is right-handed.   Patient has a high school education.   Patient does not drink any caffeine.     PHYSICAL EXAM  Filed Vitals:   12/16/14 1600  BP: 118/60  Pulse: 74  Resp: 20  Height: 5' 0.5" (1.537 m)  Weight: 164 lb 8 oz (74.617 kg)   Body mass index is 31.59 kg/(m^2).  Physical Exam  General: seated, in no evident distress Head: head  normocephalic and atraumatic. Neck: supple with no carotid or supraclavicular bruits. Respiratory: LCA Cardiovascular: regular rate and rhythm, no  murmurs Skin: no edema   Neurologic Exam  Mental Status: Awake and  alert.  MMSE today score 9/30 , AFT  (last visitMMSE she scored 8 /30, AFT 2). Names dogs and cats, but cannot answer which animals give milk, give eggs, give meat .   Animal Fluency in 1 minute is 2 only.  Clock drawing is 0/4. She cannot longer hold the pencil to draw or write.  Mood and affect ; she is aloof, drowsy and not participating, appears to day dream. she appears lost, not distressed. Cranial Nerves:  Pupils equal, briskly reactive to light.  Extraocular movements full without nystagmus.  Visual fields untestable. She does not cooperate.  Hearing is severely impaired, she couldn't hear vibration by bone/air conduction. Facial sensation intact.  Face with long standing droop on the left, tongue, palate move normally and symmetrically.  Shoulder droop in the left, retracted shoulder on the right with failure of relaxation.    Motor:  atrophy of proximal muscles, deconditioning.  Sensory: no numnbess  Coordination: Rapid alternating movements are slow in all extremities. She is staring, uninvolved.   Gait and Station: Arises from chair without difficulty. Stance is wide based. Ambulated with rolling walker, no difficulty with turns.  Reflexes: Diminished and symmetric.  Toes downgoing.  DIAGNOSTIC DATA (LABS, IMAGING, TESTING) - I reviewed patient records, labs, notes, testing and imaging myself where available.  ASSESSMENT AND PLAN Mrs. Chiamaka Latka is an 81 year-old Caucasian, married, right handed  female with PMH significant for Major Neurocognitive Disorder due to Alzheimer's disease-  without behavioral disturbance.   Dementia degree is  severe.   She requires help with all ADL's.  She is unaware of her halluciantions, reported by her spouse.   Her daughter is reporting her to forget to eat , to dress and anting to stay in bed.   Namenda XR  - Patient assistance  program.  They would like to start  Donepizil because it is more affordable (  generic).  PLAN:   12/16/2014, 4:21 PM [facility] [address], [place], [place] [address] [phone]

## 2015-01-07 NOTE — Procedures (Signed)
[facility]  EEG (ELECTROENCEPHALOGRAM) REPORT   STUDY DATE:  12-16-14  PATIENT NAME:  [name]   Date of birth is [birth year], Age  81.   ORDERING CLINICIAN: Larey Seat, MD   TECHNOLOGIST: Laretta Alstrom TECHNIQUE: Electroencephalogram was recorded utilizing standard 10-20 system of lead placement and reformatted into average and bipolar montages.      RECORDING TIME:  30 minutes  ACTIVATION:  strobe lights and hyperventilation    CLINICAL INFORMATION: advanced dementia      FINDINGS: EEG  Background rhythm of 6 hertz .  Photic stimulation caused  entrainment , no epileptiform discharges, periodic changes. Patient recorded in the awake/ drowsy but not asleep. The  EKG heart rates varied from 68 bpm to 76  in  NSR.   Photic stimulation/ Hyperventilation   IMPRESSION:  EEG is pathologically slow, there is beta fast activity noted in the frontal region. The EKG remained in normal sinus rhythm, photic entrainment was excessive at all tested frequencies. There was no epileptiform activity resulting.       Larey Seat , MD

## 2015-01-31 ENCOUNTER — Telehealth: Payer: Self-pay

## 2015-01-31 NOTE — Telephone Encounter (Signed)
Spoke to pt's husband, Linna Hoff, (per [facility]) and informed him that the pt's EEG was abnormally slow. I explained what an EEG is and that pt's exam showed that it was slower than we expected. Pt's husband verbalized understanding, and had no further questions at this time.

## 2015-01-31 NOTE — Telephone Encounter (Signed)
-----   Message from Larey Seat, MD sent at 01/28/2015 11:06 AM EDT ----- Abnormally slow EEG.

## 2015-02-09 ENCOUNTER — Encounter: Payer: Self-pay | Admitting: Family

## 2015-02-09 ENCOUNTER — Telehealth: Payer: Self-pay | Admitting: *Deleted

## 2015-02-09 ENCOUNTER — Ambulatory Visit (INDEPENDENT_AMBULATORY_CARE_PROVIDER_SITE_OTHER): Payer: Medicare Other | Admitting: Family

## 2015-02-09 VITALS — BP 100/70 | HR 71 | Temp 98.3°F | Resp 18 | Ht 59.5 in | Wt 164.6 lb

## 2015-02-09 DIAGNOSIS — K219 Gastro-esophageal reflux disease without esophagitis: Secondary | ICD-10-CM

## 2015-02-09 DIAGNOSIS — F0281 Dementia in other diseases classified elsewhere with behavioral disturbance: Secondary | ICD-10-CM

## 2015-02-09 DIAGNOSIS — I10 Essential (primary) hypertension: Secondary | ICD-10-CM

## 2015-02-09 DIAGNOSIS — G309 Alzheimer's disease, unspecified: Secondary | ICD-10-CM

## 2015-02-09 DIAGNOSIS — H1010 Acute atopic conjunctivitis, unspecified eye: Secondary | ICD-10-CM

## 2015-02-09 DIAGNOSIS — F411 Generalized anxiety disorder: Secondary | ICD-10-CM

## 2015-02-09 DIAGNOSIS — F09 Unspecified mental disorder due to known physiological condition: Secondary | ICD-10-CM

## 2015-02-09 DIAGNOSIS — F02A18 Dementia in other diseases classified elsewhere, mild, with other behavioral disturbance: Secondary | ICD-10-CM

## 2015-02-09 DIAGNOSIS — L89102 Pressure ulcer of unspecified part of back, stage 2: Secondary | ICD-10-CM

## 2015-02-09 MED ORDER — CLONIDINE HCL 0.1 MG PO TABS: 0.1000 mg | ORAL_TABLET | Freq: Every day | ORAL | Status: DC

## 2015-02-09 MED ORDER — ENSURE HIGH PROTEIN PO LIQD: ORAL | Status: DC

## 2015-02-09 MED ORDER — NYSTATIN 100000 UNIT/GM EX POWD: CUTANEOUS | Status: DC

## 2015-02-09 MED ORDER — CITALOPRAM HYDROBROMIDE 20 MG PO TABS: 20.0000 mg | ORAL_TABLET | Freq: Every day | ORAL | Status: AC

## 2015-02-09 MED ORDER — PANTOPRAZOLE SODIUM 40 MG PO TBEC: 40.0000 mg | DELAYED_RELEASE_TABLET | Freq: Every day | ORAL | Status: AC

## 2015-02-09 MED ORDER — NITROFURANTOIN MACROCRYSTAL 100 MG PO CAPS: 100.0000 mg | ORAL_CAPSULE | Freq: Every day | ORAL | Status: DC

## 2015-02-09 MED ORDER — RESTORE BARRIER CREA: TOPICAL_CREAM | Status: AC

## 2015-02-09 MED ORDER — OXYBUTYNIN CHLORIDE 5 MG PO TABS: 5.0000 mg | ORAL_TABLET | Freq: Three times a day (TID) | ORAL | Status: AC

## 2015-02-09 MED ORDER — ALPRAZOLAM 0.25 MG PO TABS: ORAL_TABLET | ORAL | Status: DC

## 2015-02-09 MED ORDER — POLYETHYLENE GLYCOL 3350 17 GM/SCOOP PO POWD: ORAL | Status: DC

## 2015-02-09 NOTE — Patient Instructions (Addendum)
Stop Pepcid. Continue Protonix for reflux.  Call Dr. Edwena Felty office re: Shannan Harper refills. Stop Clonidine.  You may use xanax as needed for agitation or anxiety.  Restart Bepreve drops as needed for eye irritation.  Add ensure high protein one can twice daily. Apply restore skin barrier to area of irritation above bottom twice daily. Apply nystatin powder to area of irritation above bottom twice daily.  For constipation you can try a small amount of miralax (i.e. One teaspoon) in 8 oz of juice once daily. If diarrhea, give every day. May increase to a full cap once daily as needed. Follow up in 1 month.

## 2015-02-09 NOTE — Telephone Encounter (Signed)
Per verbal from PCP, cancel clonidine Rx that went to [facility] as medication is being stopped today due to pt's BP.  Spoke with Barnetta Chapel at [place] and cancelled Rx.

## 2015-02-09 NOTE — Progress Notes (Signed)
Subjective:    Patient ID: [name], female    DOB: 1930/12/16, 81 y.o.   MRN: [id number]  HPI  [name] is an 81 yr old female who presents today for follow up.  HTN- not taking lisinopril.  She is maintained on clonidine. BP Readings from Last 3 Encounters:  02/09/15 100/70  12/16/14 118/60  11/10/14 104/70   Alzheimer's- she is currently maintained on namzaric.  She is followed by Dr. Brett [name] (neurology)- continues to have memory issues with hallucinations. Seems to be worsening per husband.    Allergic conjunctivitis- [name] has helped in the past but pt will not allow family to apply.  Anxiety/Depression- currently using citalopram.  They are not using prn xanax. Daughter reports that patient becomes agitated at times. She continues to have hallucinations at times.    GERD- maintained on protonix.    Review of Systems  Constitutional: Positive for appetite change.  Skin:       Dry skin, will not allow family to apply emolients   Past Medical History  Diagnosis Date  . Depressive disorder, not elsewhere classified   . Anxiety state, unspecified   . Benign neoplasm of colon   . Diverticulitis of colon (without mention of hemorrhage)   . Mixed hyperlipidemia   . Obesity, unspecified   . Unspecified essential hypertension   . Chronic rhinitis   . GERD   . INTERTRIGO, CANDIDAL   . Major neurocognitive disorder due to Alzheimer's disease, probable, without behavioral disturbance     Followed by Dr. Brett [name] at [facility].   Marland Kitchen PRESSURE ULCER STAGE I     sacral, recurrent  . Arthritis   . Bladder infection     History   Social History  . Marital Status: Married    Spouse Name: [name]  . Number of Children: 6  . Years of Education: 12   Occupational History  . Housewife    Social History Main Topics  . Smoking status: Former Smoker -- 0.20 packs/day for 2 years    Types: Cigarettes    Quit date: 07/16/1958  . Smokeless tobacco: Never  Used     Comment: stopped 67 years ago  . Alcohol Use: No  . Drug Use: No  . Sexual Activity: Not on file   Other Topics Concern  . Not on file   Social History Narrative   Patient is married [name]) and lives with her husband.   Patient is a homemaker.   Patient has 6 children.   Patient is right-handed.   Patient has a high school education.   Patient does not drink any caffeine.    Past Surgical History  Procedure Laterality Date  . Cholecystectomy    . Total hip arthroplasty Bilateral 1991, 2001  . Vesicovaginal fistula closure w/ tah    . Rotator cuff repair    . Abdominal hysterectomy  1980  . Cesarean section      Family History  Problem Relation Age of Onset  . Breast cancer Mother   . Colon cancer Mother     Allergies  Allergen Reactions  . Penicillins     REACTION: adolesent    Current Outpatient Prescriptions on File Prior to Visit  Medication Sig Dispense Refill  . aspirin 325 MG EC tablet Take 325 mg by mouth daily.      . Memantine HCl-Donepezil HCl (NAMZARIC) 28-10 MG CP24 Take 1 tablet by mouth daily. 30 capsule 3  . mometasone (NASONEX) 50 MCG/ACT  nasal spray Place 1-2 sprays into the nose daily.    . Multiple Vitamin (MULTIVITAMIN WITH MINERALS) TABS tablet Take 1 tablet by mouth daily.    . potassium chloride SA (KLOR-CON M20) 20 MEQ tablet Take 1 tablet (20 mEq total) by mouth daily. 90 tablet 1  . [DISCONTINUED] oxybutynin (DITROPAN) 5 MG tablet Take 1 tablet (5 mg total) by mouth 3 (three) times daily. 270 tablet 3   No current facility-administered medications on file prior to visit.    BP 100/70 mmHg  Pulse 71  Temp(Src) 98.3 F (36.8 C) (Oral)  Resp 18  Ht 4' 11.5" (1.511 m)  Wt 164 lb 9.6 oz (74.662 kg)  BMI 32.70 kg/m2  SpO2 99%       Objective:   Physical Exam  Constitutional: She appears well-developed and well-nourished.  HENT:  Head: Normocephalic.  Eyes: No scleral icterus.  Cardiovascular: Normal rate, regular  rhythm and normal heart sounds.   No murmur heard. Pulmonary/Chest: Effort normal and breath sounds normal. No respiratory distress. She has no wheezes.  Musculoskeletal: She exhibits no edema.  Neurological: She is alert.  Skin:  Dry skin noted Stage 1 sacral decub noted  Psychiatric: She has a normal mood and affect. Her behavior is normal. Judgment and thought content normal.          Assessment & Plan:

## 2015-02-10 LAB — BASIC METABOLIC PANEL
BUN: 18 mg/dL (ref 6–23)
CHLORIDE: 103 meq/L (ref 96–112)
CO2: 29 mEq/L (ref 19–32)
Calcium: 9 mg/dL (ref 8.4–10.5)
Creatinine, Ser: 1.09 mg/dL (ref 0.40–1.20)
GFR: 51.02 mL/min — ABNORMAL LOW (ref >60.00)
GLUCOSE: 131 mg/dL — AB (ref 70–99)
Potassium: 4 mEq/L (ref 3.5–5.1)
SODIUM: 140 meq/L (ref 135–145)

## 2015-02-10 NOTE — Assessment & Plan Note (Signed)
Discussed use of xanax prn agitation. We discussed fall prevention.

## 2015-02-10 NOTE — Assessment & Plan Note (Signed)
Daughter will be returning home.  She has been helping the patient.  The patient's husband will remain the primary care giver. On namzaric- management per neuro.

## 2015-02-10 NOTE — Assessment & Plan Note (Signed)
Advised pt  On trial of Bepreve prn. They have med at home.

## 2015-02-10 NOTE — Assessment & Plan Note (Signed)
Recommended Restore cream bid, nystatin powder bid.  Add ensure high protein bid to help with nutritional status.

## 2015-02-10 NOTE — Assessment & Plan Note (Signed)
BP is low today. D/c once daily clonidine.

## 2015-02-10 NOTE — Assessment & Plan Note (Signed)
Stable. I don't think she needs protonix and pepcid. D/c pepcid.

## 2015-02-13 ENCOUNTER — Encounter: Payer: Self-pay | Admitting: Family

## 2015-03-02 ENCOUNTER — Ambulatory Visit (HOSPITAL_BASED_OUTPATIENT_CLINIC_OR_DEPARTMENT_OTHER)
Admission: RE | Admit: 2015-03-02 | Discharge: 2015-03-02 | Disposition: A | Discharge status: Home / Self Care | Payer: Medicare Other | Source: Ambulatory Visit | Attending: Family | Admitting: Family

## 2015-03-02 ENCOUNTER — Encounter: Payer: Self-pay | Admitting: Family

## 2015-03-02 ENCOUNTER — Ambulatory Visit (INDEPENDENT_AMBULATORY_CARE_PROVIDER_SITE_OTHER): Payer: Medicare Other | Admitting: Family

## 2015-03-02 VITALS — BP 120/84 | HR 88 | Temp 98.1°F | Resp 16 | Ht 59.5 in | Wt 158.2 lb

## 2015-03-02 DIAGNOSIS — F329 Major depressive disorder, single episode, unspecified: Secondary | ICD-10-CM | POA: Diagnosis not present

## 2015-03-02 DIAGNOSIS — G309 Alzheimer's disease, unspecified: Secondary | ICD-10-CM | POA: Diagnosis not present

## 2015-03-02 DIAGNOSIS — R400 Somnolence: Secondary | ICD-10-CM | POA: Insufficient documentation

## 2015-03-02 DIAGNOSIS — L89151 Pressure ulcer of sacral region, stage 1: Secondary | ICD-10-CM

## 2015-03-02 DIAGNOSIS — N3000 Acute cystitis without hematuria: Secondary | ICD-10-CM | POA: Diagnosis not present

## 2015-03-02 LAB — POCT URINALYSIS DIPSTICK
BILIRUBIN UA: NEGATIVE
Glucose, UA: NEGATIVE
KETONES UA: NEGATIVE
Nitrite, UA: NEGATIVE
PH UA: 5.5
SPEC GRAV UA: 1.025
Urobilinogen, UA: 4

## 2015-03-02 MED ORDER — CIPROFLOXACIN HCL 250 MG PO TABS: 250.0000 mg | ORAL_TABLET | Freq: Two times a day (BID) | ORAL | Status: DC

## 2015-03-02 NOTE — Patient Instructions (Addendum)
Complete chest x ray on the first floor.  Start cipro for possible urinary tract infection. Push fluids (water). Try adding ensure twice daily. You will be contacted about the home health nurse. Follow up on Friday.  Call if increased confusion, fever, weakness, or if not eating/drinking.

## 2015-03-02 NOTE — Progress Notes (Signed)
Pre visit review using our clinic review tool, if applicable. No additional management support is needed unless otherwise documented below in the visit note. 

## 2015-03-02 NOTE — Progress Notes (Signed)
Subjective:    Patient ID: [name], female    DOB: 1931-01-16, 81 y.o.   MRN: [id number]  HPI  Ms. Meenan is an 81 yr old female who presents today for follow up. She is brought today by her husband. Her husband is her primary care giver.  He is on home oxygen and is not in great health.  Their daughter had been staying with them, however patient and husband asked her to return home.  Husband reports that for the past 2 days, she has been sleeping more and not doing her regular activities (patient usually enjoys coloring but has not colored over the last 2 days).  He reports that while she is taking in PO's, her PO intake has been poor.  Patient has been sleeping more than usual and staying in the bed most of the day.  Only has been getting up to use the bathroom. We discussed adding ensure last visit but the husband reports he has not been giving this to her regularly and seems to have forgotten about it. Since her vist on 02/09/15 she has lost 6 pounds.   Sacral decub- husband reports that he has been applying desitin cream and nystatin powder.  Did not pick up Restore ointment. He tells me that her skin looks "better" than it did last time.   Recurrent UTI-  Pt has rx for prophylactic daily macrodantin on her list which was originally started by urology.  Husband shows me their med list and it appears that he has not been giving her macrodantin.   Rare agitation at this time. They are not using xanax.    Review of Systems Past Medical History  Diagnosis Date  . Depressive disorder, not elsewhere classified   . Anxiety state, unspecified   . Benign neoplasm of colon   . Diverticulitis of colon (without mention of hemorrhage)   . Mixed hyperlipidemia   . Obesity, unspecified   . Unspecified essential hypertension   . Chronic rhinitis   . GERD   . INTERTRIGO, CANDIDAL   . Major neurocognitive disorder due to Alzheimer's disease, probable, without behavioral disturbance    Followed by Dr. Brett Fairy at [facility].   Marland Kitchen PRESSURE ULCER STAGE I     sacral, recurrent  . Arthritis   . Bladder infection     Social History   Social History  . Marital Status: Married    Spouse Name: Quillian Quince  . Number of Children: 6  . Years of Education: 12   Occupational History  . Housewife    Social History Main Topics  . Smoking status: Former Smoker -- 0.20 packs/day for 2 years    Types: Cigarettes    Quit date: 07/16/1958  . Smokeless tobacco: Never Used     Comment: stopped 67 years ago  . Alcohol Use: No  . Drug Use: No  . Sexual Activity: Not on file   Other Topics Concern  . Not on file   Social History Narrative   Patient is married Quillian Quince) and lives with her husband.   Patient is a homemaker.   Patient has 6 children.   Patient is right-handed.   Patient has a high school education.   Patient does not drink any caffeine.    Past Surgical History  Procedure Laterality Date  . Cholecystectomy    . Total hip arthroplasty Bilateral 1991, 2001  . Vesicovaginal fistula closure w/ tah    . Rotator cuff repair    .  Abdominal hysterectomy  1980  . Cesarean section      Family History  Problem Relation Age of Onset  . Breast cancer Mother   . Colon cancer Mother     Allergies  Allergen Reactions  . Penicillins     REACTION: adolesent    Current Outpatient Prescriptions on File Prior to Visit  Medication Sig Dispense Refill  . aspirin 325 MG EC tablet Take 325 mg by mouth daily.      . citalopram (CELEXA) 20 MG tablet Take 1 tablet (20 mg total) by mouth daily. 90 tablet 1  . Memantine HCl-Donepezil HCl (NAMZARIC) 28-10 MG CP24 Take 1 tablet by mouth daily. 30 capsule 3  . mometasone (NASONEX) 50 MCG/ACT nasal spray Place 1-2 sprays into the nose daily.    . Multiple Vitamin (MULTIVITAMIN WITH MINERALS) TABS tablet Take 1 tablet by mouth daily.    . nitrofurantoin (MACRODANTIN) 100 MG capsule Take 1 capsule (100 mg total) by mouth  daily. 90 capsule 1  . oxybutynin (DITROPAN) 5 MG tablet Take 1 tablet (5 mg total) by mouth 3 (three) times daily. 270 tablet 1  . pantoprazole (PROTONIX) 40 MG tablet Take 1 tablet (40 mg total) by mouth daily before breakfast. 90 tablet 1  . potassium chloride SA (KLOR-CON M20) 20 MEQ tablet Take 1 tablet (20 mEq total) by mouth daily. 90 tablet 1  . ALPRAZolam (XANAX) 0.25 MG tablet Take one table by mouth 3 times daily as needed for agitation or insomnia. (Patient not taking: Reported on 03/02/2015) 30 tablet 0  . polyethylene glycol powder (GLYCOLAX/MIRALAX) powder For constipation you can try a small amount of miralax (i.e. One teaspoon) in 8 oz of juice once daily. If diarrhea, give every day. May increase to a full cap once daily as needed. (Patient not taking: Reported on 03/02/2015) 3350 g 1  . protective barrier (RESTORE) CREA Apply twice daily to upper buttocks (Patient not taking: Reported on 03/02/2015) 120 g 5  . [DISCONTINUED] oxybutynin (DITROPAN) 5 MG tablet Take 1 tablet (5 mg total) by mouth 3 (three) times daily. 270 tablet 3   No current facility-administered medications on file prior to visit.    BP 120/84 mmHg  Pulse 88  Temp(Src) 98.1 F (36.7 C) (Oral)  Resp 16  Ht 4' 11.5" (1.511 m)  Wt 158 lb 3.2 oz (71.759 kg)  BMI 31.43 kg/m2  SpO2 96%       Objective:   Physical Exam  Constitutional:  Elderly white female reclined  in exam chair, dozing off, easily arousable. NAD  Cardiovascular: Normal rate and regular rhythm.   No murmur heard. Pulmonary/Chest: Effort normal and breath sounds normal. No respiratory distress. She has no wheezes. She has no rales. She exhibits no tenderness.  Abdominal: Soft. She exhibits no distension and no mass. There is no rebound and no guarding.  Musculoskeletal: She exhibits no edema.  Neurological: She is alert.  Skin: Skin is warm and dry.  Stage 1 sacral decub ulcer is again noted, however the affected area has appeared to  enlarge          Assessment & Plan:  50 minutes spent with patient and husband. >50% of this time was spent counseling on med management/wound care/dementia.

## 2015-03-03 DIAGNOSIS — N390 Urinary tract infection, site not specified: Secondary | ICD-10-CM | POA: Insufficient documentation

## 2015-03-03 LAB — COMPREHENSIVE METABOLIC PANEL
ALK PHOS: 70 U/L (ref 39–117)
ALT: 7 U/L (ref 0–35)
AST: 15 U/L (ref 0–37)
Albumin: 3.5 g/dL (ref 3.5–5.2)
BILIRUBIN TOTAL: 0.4 mg/dL (ref 0.2–1.2)
BUN: 20 mg/dL (ref 6–23)
CO2: 29 mEq/L (ref 19–32)
Calcium: 9.4 mg/dL (ref 8.4–10.5)
Chloride: 106 mEq/L (ref 96–112)
Creatinine, Ser: 1.01 mg/dL (ref 0.40–1.20)
GFR: 55.71 mL/min — ABNORMAL LOW (ref >60.00)
GLUCOSE: 124 mg/dL — AB (ref 70–99)
Potassium: 3.8 mEq/L (ref 3.5–5.1)
SODIUM: 142 meq/L (ref 135–145)
TOTAL PROTEIN: 7.3 g/dL (ref 6.0–8.3)

## 2015-03-03 LAB — URINE CULTURE
Colony Count: NO GROWTH
Organism ID, Bacteria: NO GROWTH

## 2015-03-03 NOTE — Assessment & Plan Note (Addendum)
I suspect that this is the cause for the patient's recent decline the last 2 days.  She does not appear toxic/septic and vitals are stable. I performed a urinary catheterization on the patient in the office today.  UA is suggestive of UTI.  Will send urine for culture.  Advised husband to complete cipro x 3 days, then resume macrodantin once daily.  Plan close follow up in 2 days.  Also advised husband":Push fluids (water). Try adding ensure twice daily. Call if increased confusion, fever, weakness, or if not eating/drinking.   CXR performed today is normal.  Lab work is pending.

## 2015-03-03 NOTE — Assessment & Plan Note (Signed)
Spoke at length with husband about skin/wound care. Discussed importance of nutrition (ensure + regular meals), frequent turning, getting patient out of bed.  He was unaware that the red area overlying her sacrum is a developing bed sore.  Will arrange home health RN to come look etc. and perform wound monitoring in the home. Will also ask them to assist husband with medication organization to make sure she is not missing any critical meds.  I think that the husband is really trying hard to care for her but that given his health it is a bit of a struggle.

## 2015-03-04 ENCOUNTER — Telehealth: Payer: Self-pay | Admitting: Family

## 2015-03-04 ENCOUNTER — Ambulatory Visit (INDEPENDENT_AMBULATORY_CARE_PROVIDER_SITE_OTHER): Payer: Medicare Other | Admitting: Family

## 2015-03-04 ENCOUNTER — Encounter: Payer: Self-pay | Admitting: Family

## 2015-03-04 VITALS — BP 134/80 | HR 79 | Temp 97.9°F | Resp 18 | Ht 59.5 in | Wt 159.0 lb

## 2015-03-04 DIAGNOSIS — F0281 Dementia in other diseases classified elsewhere with behavioral disturbance: Secondary | ICD-10-CM

## 2015-03-04 DIAGNOSIS — F09 Unspecified mental disorder due to known physiological condition: Secondary | ICD-10-CM

## 2015-03-04 DIAGNOSIS — R531 Weakness: Secondary | ICD-10-CM | POA: Diagnosis not present

## 2015-03-04 DIAGNOSIS — G309 Alzheimer's disease, unspecified: Secondary | ICD-10-CM | POA: Diagnosis not present

## 2015-03-04 LAB — CBC WITH DIFFERENTIAL/PLATELET
BASOS PCT: 0.5 % (ref 0.0–3.0)
Basophils Absolute: 0 10*3/uL (ref 0.0–0.1)
EOS PCT: 4.8 % (ref 0.0–5.0)
Eosinophils Absolute: 0.4 10*3/uL (ref 0.0–0.7)
HCT: 45.2 % (ref 36.0–46.0)
HEMOGLOBIN: 14.9 g/dL (ref 12.0–15.0)
LYMPHS ABS: 1.7 10*3/uL (ref 0.7–4.0)
Lymphocytes Relative: 19.6 % (ref 12.0–46.0)
MCHC: 32.9 g/dL (ref 30.0–36.0)
MCV: 98.6 fl (ref 78.0–100.0)
MONO ABS: 0.7 10*3/uL (ref 0.1–1.0)
Monocytes Relative: 7.7 % (ref 3.0–12.0)
NEUTROS PCT: 67.4 % (ref 43.0–77.0)
Neutro Abs: 5.9 10*3/uL (ref 1.4–7.7)
Platelets: 195 10*3/uL (ref 150.0–400.0)
RBC: 4.58 Mil/uL (ref 3.87–5.11)
RDW: 15.4 % (ref 11.5–15.5)
WBC: 8.8 10*3/uL (ref 4.0–10.5)

## 2015-03-04 NOTE — Patient Instructions (Addendum)
Please schedule a followup/medicare wellness at the front desk- in about 2 weeks. You will be contacted about  Your referral to home hospice/palliative care.  Please complete lab work prior to leaving. If [name] develops fever, increased weakness/confusion, or stops eating/drinking- please bring her to the ER.

## 2015-03-04 NOTE — Progress Notes (Signed)
Subjective:    Patient ID: [name], female    DOB: 1931/01/27, 81 y.o.   MRN: [id number]  HPI   [name] is an 81 yr old female who presents today for follow up. She is accompanied by her husband and her daughter [name]. Last visit her UA was suggestive of possible UTI and she was placed on empiric cipro. Urine culture was negative. CXR was clear.  CMET unremarkable except mild hyperglycemia which is not new. Her weight is up one pound since her visit 2 days ago.  She drank an ensure yesterday. She is tolerating PO's.    Wt Readings from Last 3 Encounters:  03/04/15 159 lb (72.122 kg)  03/02/15 158 lb 3.2 oz (71.759 kg)  02/09/15 164 lb 9.6 oz (74.662 kg)    Review of Systems See HPI  Past Medical History  Diagnosis Date  . Depressive disorder, not elsewhere classified   . Anxiety state, unspecified   . Benign neoplasm of colon   . Diverticulitis of colon (without mention of hemorrhage)   . Mixed hyperlipidemia   . Obesity, unspecified   . Unspecified essential hypertension   . Chronic rhinitis   . GERD   . INTERTRIGO, CANDIDAL   . Major neurocognitive disorder due to Alzheimer's disease, probable, without behavioral disturbance     Followed by Dr. Brett Fairy at [facility].   Marland Kitchen PRESSURE ULCER STAGE I     sacral, recurrent  . Arthritis   . Bladder infection     Social History   Social History  . Marital Status: Married    Spouse Name: Quillian Quince  . Number of Children: 6  . Years of Education: 12   Occupational History  . Housewife    Social History Main Topics  . Smoking status: Former Smoker -- 0.20 packs/day for 2 years    Types: Cigarettes    Quit date: 07/16/1958  . Smokeless tobacco: Never Used     Comment: stopped 67 years ago  . Alcohol Use: No  . Drug Use: No  . Sexual Activity: Not on file   Other Topics Concern  . Not on file   Social History Narrative   Patient is married Quillian Quince) and lives with her husband.   Patient is a  homemaker.   Patient has 6 children.   Patient is right-handed.   Patient has a high school education.   Patient does not drink any caffeine.    Past Surgical History  Procedure Laterality Date  . Cholecystectomy    . Total hip arthroplasty Bilateral 1991, 2001  . Vesicovaginal fistula closure w/ tah    . Rotator cuff repair    . Abdominal hysterectomy  1980  . Cesarean section      Family History  Problem Relation Age of Onset  . Breast cancer Mother   . Colon cancer Mother     Allergies  Allergen Reactions  . Penicillins     REACTION: adolesent    Current Outpatient Prescriptions on File Prior to Visit  Medication Sig Dispense Refill  . ALPRAZolam (XANAX) 0.25 MG tablet Take one table by mouth 3 times daily as needed for agitation or insomnia. 30 tablet 0  . aspirin 325 MG EC tablet Take 325 mg by mouth daily.      . ciprofloxacin (CIPRO) 250 MG tablet Take 1 tablet (250 mg total) by mouth 2 (two) times daily. 6 tablet 0  . citalopram (CELEXA) 20 MG tablet Take 1 tablet (20  mg total) by mouth daily. 90 tablet 1  . Memantine HCl-Donepezil HCl (NAMZARIC) 28-10 MG CP24 Take 1 tablet by mouth daily. 30 capsule 3  . mometasone (NASONEX) 50 MCG/ACT nasal spray Place 1-2 sprays into the nose daily.    . Multiple Vitamin (MULTIVITAMIN WITH MINERALS) TABS tablet Take 1 tablet by mouth daily.    . nitrofurantoin (MACRODANTIN) 100 MG capsule Take 1 capsule (100 mg total) by mouth daily. 90 capsule 1  . oxybutynin (DITROPAN) 5 MG tablet Take 1 tablet (5 mg total) by mouth 3 (three) times daily. 270 tablet 1  . pantoprazole (PROTONIX) 40 MG tablet Take 1 tablet (40 mg total) by mouth daily before breakfast. 90 tablet 1  . polyethylene glycol powder (GLYCOLAX/MIRALAX) powder For constipation you can try a small amount of miralax (i.e. One teaspoon) in 8 oz of juice once daily. If diarrhea, give every day. May increase to a full cap once daily as needed. 3350 g 1  . potassium chloride SA  (KLOR-CON M20) 20 MEQ tablet Take 1 tablet (20 mEq total) by mouth daily. 90 tablet 1  . protective barrier (RESTORE) CREA Apply twice daily to upper buttocks 120 g 5  . [DISCONTINUED] oxybutynin (DITROPAN) 5 MG tablet Take 1 tablet (5 mg total) by mouth 3 (three) times daily. 270 tablet 3   No current facility-administered medications on file prior to visit.    BP 134/80 mmHg  Pulse 79  Temp(Src) 97.9 F (36.6 C) (Oral)  Resp 18  Ht 4' 11.5" (1.511 m)  Wt 159 lb (72.122 kg)  BMI 31.59 kg/m2  SpO2 96%       Objective:   Physical Exam  Constitutional: She appears well-developed and well-nourished.  HENT:  Head: Normocephalic and atraumatic.  Cardiovascular: Normal rate, regular rhythm and normal heart sounds.   No murmur heard. Pulmonary/Chest: Effort normal and breath sounds normal. No respiratory distress. She has no wheezes.  Musculoskeletal: She exhibits no edema.  Neurological:  Patient is oriented to person.  Appears less drowsy today.   Skin: Skin is warm and dry.  Psychiatric: She has a normal mood and affect. Her behavior is normal. Judgment and thought content normal.          Assessment & Plan:

## 2015-03-04 NOTE — Telephone Encounter (Signed)
Caller name:Mauren Relation to VQ:[id number] Call back number:[phone] Pharmacy:  Reason for call: daughter called and wanted to make Melissa aware that after they got home today she found out that the [place] [employer] has tested for ecoli in the water,and they were working in her moms area so she wanted you to know that because it could possibly be why she is sick and wanted to know Melissa thoughts .

## 2015-03-04 NOTE — Progress Notes (Signed)
Pre visit review using our clinic review tool, if applicable. No additional management support is needed unless otherwise documented below in the visit note. 

## 2015-03-04 NOTE — Telephone Encounter (Signed)
Spoke with daughter. Advised her the WBC normal. Pt without GI complaints. Recently had cipro. Advised daughter to bring her to the ED if she develop GI issues over the weekend. She verbalizes understandign.

## 2015-03-05 NOTE — Assessment & Plan Note (Signed)
CBC is obtained and is unremarkable.  We discussed today at her visit that her decline could be related to progression of her dementia as medical work up has failed to reveal any acute medical issue.  Daughter inquired about hospice/palliative care referral.  I think that she would be eligible for home hospice and that it would provide the family with some valuable resources.  Will place referral.  25 minutes spent with pt and family today. >50% of this time was spent counseling on dementia and its progression.

## 2015-03-07 ENCOUNTER — Telehealth: Payer: Self-pay | Admitting: Family

## 2015-03-07 DIAGNOSIS — L89151 Pressure ulcer of sacral region, stage 1: Secondary | ICD-10-CM

## 2015-03-07 NOTE — Telephone Encounter (Signed)
Melissa -- Fredderick Phenix states that Moquino saw pt on Saturday. States they were talking about physical therapy and family became confused about why they were there. Pt's spouse received services through [facility] in the past and he is requesting referral to [facility] for personal care services as well as decubitus ulcer.  Referral placed.

## 2015-03-07 NOTE — Telephone Encounter (Signed)
Caller name: Becka Lagasse  Relationship to patient: daughter   Can be reached:[phone] (work)  [phone] (cell)  Pharmacy:  Reason for call: pt is calling in regards to her mom PCS . Pt's daughter would like to speak with you directly.

## 2015-03-07 NOTE — Telephone Encounter (Signed)
Caller name:[facility]  Relation to pt: Call back number:[phone] Pharmacy:  Reason for call: pt did not meet admission criteria and will not be admitted at this time, however if Melissa feels as though pt really needs to be hospice to contact you directly to discuss.

## 2015-03-07 NOTE — Telephone Encounter (Signed)
Left message for pt's daughter to return my call

## 2015-03-14 ENCOUNTER — Ambulatory Visit: Payer: Medicare Other | Admitting: Family

## 2015-03-16 ENCOUNTER — Inpatient Hospital Stay (HOSPITAL_COMMUNITY): Payer: Medicare Other

## 2015-03-16 ENCOUNTER — Emergency Department (HOSPITAL_COMMUNITY): Payer: Medicare Other

## 2015-03-16 ENCOUNTER — Encounter (HOSPITAL_COMMUNITY): Payer: Self-pay | Admitting: Emergency Medicine

## 2015-03-16 ENCOUNTER — Inpatient Hospital Stay (HOSPITAL_COMMUNITY)
Admission: EM | Admit: 2015-03-16 | Discharge: 2015-03-22 | DRG: 853 | Disposition: A | Discharge status: Skilled Nursing Facility | Payer: Medicare Other | Attending: Family Medicine | Admitting: Family Medicine

## 2015-03-16 DIAGNOSIS — F0281 Dementia in other diseases classified elsewhere with behavioral disturbance: Secondary | ICD-10-CM | POA: Diagnosis present

## 2015-03-16 DIAGNOSIS — E876 Hypokalemia: Secondary | ICD-10-CM | POA: Diagnosis not present

## 2015-03-16 DIAGNOSIS — D696 Thrombocytopenia, unspecified: Secondary | ICD-10-CM | POA: Diagnosis present

## 2015-03-16 DIAGNOSIS — K219 Gastro-esophageal reflux disease without esophagitis: Secondary | ICD-10-CM | POA: Diagnosis not present

## 2015-03-16 DIAGNOSIS — E860 Dehydration: Secondary | ICD-10-CM | POA: Diagnosis not present

## 2015-03-16 DIAGNOSIS — F411 Generalized anxiety disorder: Secondary | ICD-10-CM | POA: Diagnosis present

## 2015-03-16 DIAGNOSIS — N12 Tubulo-interstitial nephritis, not specified as acute or chronic: Secondary | ICD-10-CM | POA: Diagnosis not present

## 2015-03-16 DIAGNOSIS — Z803 Family history of malignant neoplasm of breast: Secondary | ICD-10-CM

## 2015-03-16 DIAGNOSIS — N1330 Unspecified hydronephrosis: Secondary | ICD-10-CM | POA: Diagnosis not present

## 2015-03-16 DIAGNOSIS — B377 Candidal sepsis: Secondary | ICD-10-CM | POA: Insufficient documentation

## 2015-03-16 DIAGNOSIS — Z66 Do not resuscitate: Secondary | ICD-10-CM | POA: Diagnosis present

## 2015-03-16 DIAGNOSIS — B379 Candidiasis, unspecified: Secondary | ICD-10-CM | POA: Diagnosis not present

## 2015-03-16 DIAGNOSIS — E669 Obesity, unspecified: Secondary | ICD-10-CM | POA: Diagnosis not present

## 2015-03-16 DIAGNOSIS — G9340 Encephalopathy, unspecified: Secondary | ICD-10-CM | POA: Diagnosis present

## 2015-03-16 DIAGNOSIS — R4182 Altered mental status, unspecified: Secondary | ICD-10-CM | POA: Diagnosis present

## 2015-03-16 DIAGNOSIS — G309 Alzheimer's disease, unspecified: Secondary | ICD-10-CM | POA: Diagnosis not present

## 2015-03-16 DIAGNOSIS — Z9981 Dependence on supplemental oxygen: Secondary | ICD-10-CM | POA: Diagnosis not present

## 2015-03-16 DIAGNOSIS — G319 Degenerative disease of nervous system, unspecified: Secondary | ICD-10-CM | POA: Diagnosis not present

## 2015-03-16 DIAGNOSIS — N132 Hydronephrosis with renal and ureteral calculous obstruction: Secondary | ICD-10-CM | POA: Diagnosis not present

## 2015-03-16 DIAGNOSIS — Z96643 Presence of artificial hip joint, bilateral: Secondary | ICD-10-CM | POA: Diagnosis present

## 2015-03-16 DIAGNOSIS — N200 Calculus of kidney: Secondary | ICD-10-CM | POA: Diagnosis not present

## 2015-03-16 DIAGNOSIS — Z87440 Personal history of urinary (tract) infections: Secondary | ICD-10-CM

## 2015-03-16 DIAGNOSIS — I10 Essential (primary) hypertension: Secondary | ICD-10-CM | POA: Diagnosis not present

## 2015-03-16 DIAGNOSIS — N179 Acute kidney failure, unspecified: Secondary | ICD-10-CM | POA: Diagnosis present

## 2015-03-16 DIAGNOSIS — Z7982 Long term (current) use of aspirin: Secondary | ICD-10-CM

## 2015-03-16 DIAGNOSIS — D638 Anemia in other chronic diseases classified elsewhere: Secondary | ICD-10-CM | POA: Diagnosis not present

## 2015-03-16 DIAGNOSIS — N390 Urinary tract infection, site not specified: Secondary | ICD-10-CM | POA: Diagnosis not present

## 2015-03-16 DIAGNOSIS — J310 Chronic rhinitis: Secondary | ICD-10-CM | POA: Diagnosis present

## 2015-03-16 DIAGNOSIS — Z800 Family history of malignant neoplasm of digestive organs: Secondary | ICD-10-CM

## 2015-03-16 DIAGNOSIS — E782 Mixed hyperlipidemia: Secondary | ICD-10-CM | POA: Diagnosis not present

## 2015-03-16 DIAGNOSIS — Z90710 Acquired absence of both cervix and uterus: Secondary | ICD-10-CM | POA: Diagnosis not present

## 2015-03-16 DIAGNOSIS — B3749 Other urogenital candidiasis: Secondary | ICD-10-CM | POA: Diagnosis not present

## 2015-03-16 DIAGNOSIS — K5900 Constipation, unspecified: Secondary | ICD-10-CM | POA: Diagnosis present

## 2015-03-16 DIAGNOSIS — Z880 Allergy status to penicillin: Secondary | ICD-10-CM

## 2015-03-16 DIAGNOSIS — B49 Unspecified mycosis: Secondary | ICD-10-CM | POA: Diagnosis not present

## 2015-03-16 DIAGNOSIS — Z79899 Other long term (current) drug therapy: Secondary | ICD-10-CM | POA: Diagnosis not present

## 2015-03-16 DIAGNOSIS — F329 Major depressive disorder, single episode, unspecified: Secondary | ICD-10-CM | POA: Diagnosis not present

## 2015-03-16 DIAGNOSIS — L89151 Pressure ulcer of sacral region, stage 1: Secondary | ICD-10-CM | POA: Diagnosis present

## 2015-03-16 DIAGNOSIS — I679 Cerebrovascular disease, unspecified: Secondary | ICD-10-CM | POA: Diagnosis present

## 2015-03-16 DIAGNOSIS — D649 Anemia, unspecified: Secondary | ICD-10-CM | POA: Diagnosis present

## 2015-03-16 DIAGNOSIS — Z87891 Personal history of nicotine dependence: Secondary | ICD-10-CM

## 2015-03-16 LAB — URINALYSIS, ROUTINE W REFLEX MICROSCOPIC
Bilirubin Urine: NEGATIVE
GLUCOSE, UA: NEGATIVE mg/dL
KETONES UR: NEGATIVE mg/dL
Nitrite: NEGATIVE
Protein, ur: 30 mg/dL — AB
Specific Gravity, Urine: 1.018 (ref 1.005–1.030)
Urobilinogen, UA: 0.2 mg/dL (ref 0.0–1.0)
pH: 6.5 (ref 5.0–8.0)

## 2015-03-16 LAB — CBC WITH DIFFERENTIAL/PLATELET
Basophils Absolute: 0 10*3/uL (ref 0.0–0.1)
Basophils Relative: 0 % (ref 0–1)
Eosinophils Absolute: 0.2 10*3/uL (ref 0.0–0.7)
Eosinophils Relative: 1 % (ref 0–5)
HEMATOCRIT: 45 % (ref 36.0–46.0)
Hemoglobin: 15.1 g/dL — ABNORMAL HIGH (ref 12.0–15.0)
LYMPHS ABS: 1.3 10*3/uL (ref 0.7–4.0)
LYMPHS PCT: 9 % — AB (ref 12–46)
MCH: 33.5 pg (ref 26.0–34.0)
MCHC: 33.6 g/dL (ref 30.0–36.0)
MCV: 99.8 fL (ref 78.0–100.0)
MONO ABS: 1.5 10*3/uL — AB (ref 0.1–1.0)
MONOS PCT: 10 % (ref 3–12)
NEUTROS ABS: 12.4 10*3/uL — AB (ref 1.7–7.7)
Neutrophils Relative %: 80 % — ABNORMAL HIGH (ref 43–77)
Platelets: 150 10*3/uL (ref 150–400)
RBC: 4.51 MIL/uL (ref 3.87–5.11)
RDW: 14.5 % (ref 11.5–15.5)
WBC: 15.4 10*3/uL — ABNORMAL HIGH (ref 4.0–10.5)

## 2015-03-16 LAB — COMPREHENSIVE METABOLIC PANEL
ALT: 13 U/L — ABNORMAL LOW (ref 14–54)
ANION GAP: 8 (ref 5–15)
AST: 32 U/L (ref 15–41)
Albumin: 3.4 g/dL — ABNORMAL LOW (ref 3.5–5.0)
Alkaline Phosphatase: 76 U/L (ref 38–126)
BILIRUBIN TOTAL: 0.9 mg/dL (ref 0.3–1.2)
BUN: 19 mg/dL (ref 6–20)
CO2: 30 mmol/L (ref 22–32)
Calcium: 9.1 mg/dL (ref 8.9–10.3)
Chloride: 101 mmol/L (ref 101–111)
Creatinine, Ser: 1.67 mg/dL — ABNORMAL HIGH (ref 0.44–1.00)
GFR calc Af Amer: 32 mL/min — ABNORMAL LOW (ref >60)
GFR, EST NON AFRICAN AMERICAN: 27 mL/min — AB (ref >60)
Glucose, Bld: 149 mg/dL — ABNORMAL HIGH (ref 65–99)
POTASSIUM: 4.3 mmol/L (ref 3.5–5.1)
Sodium: 139 mmol/L (ref 135–145)
TOTAL PROTEIN: 7.9 g/dL (ref 6.5–8.1)

## 2015-03-16 LAB — URINE MICROSCOPIC-ADD ON

## 2015-03-16 LAB — TROPONIN I: Troponin I: <0.03 ng/mL (ref <0.031)

## 2015-03-16 LAB — AMMONIA: Ammonia: 21 umol/L (ref 9–35)

## 2015-03-16 LAB — TSH: TSH: 1.245 u[IU]/mL (ref 0.350–4.500)

## 2015-03-16 LAB — MAGNESIUM: MAGNESIUM: 1.8 mg/dL (ref 1.7–2.4)

## 2015-03-16 LAB — CBG MONITORING, ED: Glucose-Capillary: 131 mg/dL — ABNORMAL HIGH (ref 65–99)

## 2015-03-16 MED ORDER — SODIUM CHLORIDE 0.9 % IJ SOLN
3.0000 mL | Freq: Two times a day (BID) | INTRAMUSCULAR | Status: DC
  Administered 2015-03-20: 3 mL via INTRAVENOUS

## 2015-03-16 MED ORDER — ACETAMINOPHEN 325 MG PO TABS
650.0000 mg | ORAL_TABLET | Freq: Four times a day (QID) | ORAL | Status: DC | PRN
  Administered 2015-03-19: 650 mg via ORAL
  Filled 2015-03-16: qty 2

## 2015-03-16 MED ORDER — DEXTROSE 5 % IV SOLN
1.0000 g | INTRAVENOUS | Status: DC
  Administered 2015-03-17 – 2015-03-20 (×4): 1 g via INTRAVENOUS
  Filled 2015-03-16 (×4): qty 10

## 2015-03-16 MED ORDER — ENSURE ENLIVE PO LIQD
237.0000 mL | Freq: Two times a day (BID) | ORAL | Status: DC
  Administered 2015-03-17 – 2015-03-22 (×11): 237 mL via ORAL

## 2015-03-16 MED ORDER — SODIUM CHLORIDE 0.9 % IV SOLN
INTRAVENOUS | Status: DC
  Administered 2015-03-16 – 2015-03-19 (×5): via INTRAVENOUS

## 2015-03-16 MED ORDER — CITALOPRAM HYDROBROMIDE 20 MG PO TABS
20.0000 mg | ORAL_TABLET | Freq: Every day | ORAL | Status: DC
  Administered 2015-03-16 – 2015-03-22 (×7): 20 mg via ORAL
  Filled 2015-03-16 (×8): qty 1

## 2015-03-16 MED ORDER — ONDANSETRON HCL 4 MG PO TABS: 4.0000 mg | ORAL_TABLET | Freq: Four times a day (QID) | ORAL | Status: DC | PRN

## 2015-03-16 MED ORDER — MEMANTINE HCL ER 28 MG PO CP24
28.0000 mg | ORAL_CAPSULE | Freq: Every day | ORAL | Status: DC
  Administered 2015-03-16 – 2015-03-22 (×7): 28 mg via ORAL
  Filled 2015-03-16 (×7): qty 1

## 2015-03-16 MED ORDER — DEXTROSE 5 % IV SOLN
1.0000 g | Freq: Once | INTRAVENOUS | Status: AC
  Administered 2015-03-16: 1 g via INTRAVENOUS
  Filled 2015-03-16: qty 10

## 2015-03-16 MED ORDER — LEVALBUTEROL HCL 0.63 MG/3ML IN NEBU: 0.6300 mg | INHALATION_SOLUTION | Freq: Four times a day (QID) | RESPIRATORY_TRACT | Status: DC | PRN

## 2015-03-16 MED ORDER — ENOXAPARIN SODIUM 30 MG/0.3ML ~~LOC~~ SOLN: 30.0000 mg | SUBCUTANEOUS | Status: DC

## 2015-03-16 MED ORDER — DONEPEZIL HCL 10 MG PO TABS
10.0000 mg | ORAL_TABLET | Freq: Every day | ORAL | Status: DC
  Administered 2015-03-16 – 2015-03-22 (×7): 10 mg via ORAL
  Filled 2015-03-16 (×8): qty 1

## 2015-03-16 MED ORDER — PANTOPRAZOLE SODIUM 40 MG PO TBEC
40.0000 mg | DELAYED_RELEASE_TABLET | Freq: Every day | ORAL | Status: DC
  Administered 2015-03-17 – 2015-03-22 (×7): 40 mg via ORAL
  Filled 2015-03-16 (×6): qty 1

## 2015-03-16 MED ORDER — ONDANSETRON HCL 4 MG/2ML IJ SOLN: 4.0000 mg | Freq: Four times a day (QID) | INTRAMUSCULAR | Status: DC | PRN

## 2015-03-16 MED ORDER — OXYBUTYNIN CHLORIDE 5 MG PO TABS
5.0000 mg | ORAL_TABLET | Freq: Three times a day (TID) | ORAL | Status: DC
  Administered 2015-03-16 – 2015-03-22 (×17): 5 mg via ORAL
  Filled 2015-03-16 (×21): qty 1

## 2015-03-16 MED ORDER — ACETAMINOPHEN 650 MG RE SUPP
650.0000 mg | Freq: Four times a day (QID) | RECTAL | Status: DC | PRN
  Administered 2015-03-17 (×2): 650 mg via RECTAL
  Filled 2015-03-16 (×2): qty 1

## 2015-03-16 MED ORDER — INFLUENZA VAC SPLIT QUAD 0.5 ML IM SUSY
0.5000 mL | PREFILLED_SYRINGE | INTRAMUSCULAR | Status: AC
  Administered 2015-03-17: 0.5 mL via INTRAMUSCULAR
  Filled 2015-03-16 (×2): qty 0.5

## 2015-03-16 MED ORDER — ALPRAZOLAM 0.25 MG PO TABS
0.2500 mg | ORAL_TABLET | Freq: Every evening | ORAL | Status: DC | PRN
  Administered 2015-03-21: 0.25 mg via ORAL
  Filled 2015-03-16: qty 1

## 2015-03-16 MED ORDER — SODIUM CHLORIDE 0.9 % IV BOLUS (SEPSIS)
1000.0000 mL | Freq: Once | INTRAVENOUS | Status: AC
  Administered 2015-03-16: 1000 mL via INTRAVENOUS

## 2015-03-16 MED ORDER — HEPARIN SODIUM (PORCINE) 5000 UNIT/ML IJ SOLN
5000.0000 [IU] | Freq: Three times a day (TID) | INTRAMUSCULAR | Status: DC
  Administered 2015-03-16 – 2015-03-19 (×6): 5000 [IU] via SUBCUTANEOUS
  Filled 2015-03-16 (×9): qty 1

## 2015-03-16 MED ORDER — ASPIRIN EC 325 MG PO TBEC
325.0000 mg | DELAYED_RELEASE_TABLET | Freq: Every day | ORAL | Status: DC
  Administered 2015-03-16 – 2015-03-22 (×7): 325 mg via ORAL
  Filled 2015-03-16 (×8): qty 1

## 2015-03-16 MED ORDER — MEMANTINE HCL-DONEPEZIL HCL ER 28-10 MG PO CP24: 1.0000 | ORAL_CAPSULE | Freq: Every day | ORAL | Status: DC

## 2015-03-16 NOTE — ED Notes (Signed)
Placed call into lab to assist with drawing bloods.  Mini lab attempted as well as nurse.

## 2015-03-16 NOTE — Progress Notes (Signed)
EDCM spoke to patient and her family at bedside.  Patient lives with her husband.  Patient does not wear oxygen at home.  Patient has home health services with [facility] for RN who is suppose to come back out on Thursday per patient's husband.  Patient has a walker, cane, handicapped toilet, shower bench and wheelchair at home.  Patient requires assistance with ADL's but is able to feed herself per patient's family.  Patient's family deny the need for further dme at home.  Patient's husband confirms patient's pcp is NP Inda Castle.  Admitting doctor now at bedside.  No further EDCM needs at this time.

## 2015-03-16 NOTE — ED Notes (Signed)
Bed: ZH29 Expected date:  Expected time:  Means of arrival:  Comments: EMS- 81yo F, AMS

## 2015-03-16 NOTE — ED Provider Notes (Signed)
CSN: [id number]     Arrival date & time 03/16/15  1431 History   First MD Initiated Contact with Patient 03/16/15 1459     Chief Complaint  Patient presents with  . Altered Mental Status     (Consider location/radiation/quality/duration/timing/severity/associated sxs/prior Treatment) Patient is a 81 y.o. female presenting with altered mental status.  Altered Mental Status Presenting symptoms: behavior changes, lethargy and partial responsiveness   Presenting symptoms comment:  Spasms of L arm Severity:  Moderate Most recent episode:  Today Episode history:  Continuous Duration:  3 hours Timing:  Constant Progression:  Partially resolved Chronicity:  New Context: dementia   Associated symptoms: no abdominal pain, no fever, no nausea and no vomiting   Associated symptoms comment:  Recently treated for multiple UTI   Past Medical History  Diagnosis Date  . Depressive disorder, not elsewhere classified   . Anxiety state, unspecified   . Benign neoplasm of colon   . Diverticulitis of colon (without mention of hemorrhage)   . Mixed hyperlipidemia   . Obesity, unspecified   . Unspecified essential hypertension   . Chronic rhinitis   . GERD   . INTERTRIGO, CANDIDAL   . Major neurocognitive disorder due to Alzheimer's disease, probable, without behavioral disturbance     Followed by Dr. Brett Fairy at [facility].   Marland Kitchen PRESSURE ULCER STAGE I     sacral, recurrent  . Arthritis   . Bladder infection    Past Surgical History  Procedure Laterality Date  . Cholecystectomy    . Total hip arthroplasty Bilateral 1991, 2001  . Vesicovaginal fistula closure w/ tah    . Rotator cuff repair    . Abdominal hysterectomy  1980  . Cesarean section     Family History  Problem Relation Age of Onset  . Breast cancer Mother   . Colon cancer Mother    Social History  Substance Use Topics  . Smoking status: Former Smoker -- 0.20 packs/day for 2 years    Types: Cigarettes    Quit  date: 07/16/1958  . Smokeless tobacco: Never Used     Comment: stopped 67 years ago  . Alcohol Use: No   OB History    No data available     Review of Systems  Constitutional: Negative for fever.  Gastrointestinal: Negative for nausea, vomiting and abdominal pain.  All other systems reviewed and are negative.     Allergies  Penicillins  Home Medications   Prior to Admission medications   Medication Sig Start Date End Date Taking? Authorizing Provider  ALPRAZolam Duanne Moron) 0.25 MG tablet Take one table by mouth 3 times daily as needed for agitation or insomnia. 02/09/15  Yes Debbrah Alar, NP  aspirin 325 MG EC tablet Take 325 mg by mouth daily.     Yes Historical Provider, MD  citalopram (CELEXA) 20 MG tablet Take 1 tablet (20 mg total) by mouth daily. 02/09/15  Yes Debbrah Alar, NP  Memantine HCl-Donepezil HCl (NAMZARIC) 28-10 MG CP24 Take 1 tablet by mouth daily. 12/16/14  Yes Carmen Dohmeier, MD  mometasone (NASONEX) 50 MCG/ACT nasal spray Place 1-2 sprays into the nose daily.   Yes Historical Provider, MD  Multiple Vitamin (MULTIVITAMIN WITH MINERALS) TABS tablet Take 1 tablet by mouth daily.   Yes Historical Provider, MD  nitrofurantoin (MACRODANTIN) 100 MG capsule Take 1 capsule (100 mg total) by mouth daily. 02/09/15  Yes Debbrah Alar, NP  OVER THE COUNTER MEDICATION Azo flush  'cranberry medication' reported by patient's husband.  Take one daily   Yes Historical Provider, MD  oxybutynin (DITROPAN) 5 MG tablet Take 1 tablet (5 mg total) by mouth 3 (three) times daily. 02/09/15  Yes Debbrah Alar, NP  pantoprazole (PROTONIX) 40 MG tablet Take 1 tablet (40 mg total) by mouth daily before breakfast. 02/09/15  Yes Debbrah Alar, NP  potassium chloride SA (KLOR-CON M20) 20 MEQ tablet Take 1 tablet (20 mEq total) by mouth daily. 10/06/14  Yes Debbrah Alar, NP  PRESCRIPTION MEDICATION Prescription pain killer. Patient's husband does not remember the name.  [facility] has not dispensed pain killer medication recently. Last dose last night (03/15/15)   Yes Historical Provider, MD  protective barrier (RESTORE) CREA Apply twice daily to upper buttocks 02/09/15  Yes Debbrah Alar, NP  ciprofloxacin (CIPRO) 250 MG tablet Take 1 tablet (250 mg total) by mouth 2 (two) times daily. Patient not taking: Reported on 03/16/2015 03/02/15   Debbrah Alar, NP  polyethylene glycol powder (GLYCOLAX/MIRALAX) powder For constipation you can try a small amount of miralax (i.e. One teaspoon) in 8 oz of juice once daily. If diarrhea, give every day. May increase to a full cap once daily as needed. Patient not taking: Reported on 03/16/2015 02/09/15   Debbrah Alar, NP   BP 158/95 mmHg  Pulse 80  Temp(Src) 97.5 F (36.4 C) (Oral)  Resp 15  SpO2 97% Physical Exam  Constitutional: She appears well-developed and well-nourished.  HENT:  Head: Normocephalic and atraumatic.  Right Ear: External ear normal.  Left Ear: External ear normal.  Eyes: Conjunctivae and EOM are normal. Pupils are equal, round, and reactive to light.  Neck: Normal range of motion. Neck supple.  Cardiovascular: Normal rate, regular rhythm, normal heart sounds and intact distal pulses.   Pulmonary/Chest: Effort normal and breath sounds normal.  Abdominal: Soft. Bowel sounds are normal. There is no tenderness.  Musculoskeletal: Normal range of motion.  Neurological: She is alert.  Skin: Skin is warm and dry.  Vitals reviewed.   ED Course  Procedures (including critical care time) Labs Review Labs Reviewed  CBC WITH DIFFERENTIAL/PLATELET - Abnormal; Notable for the following:    WBC 15.4 (*)    Hemoglobin 15.1 (*)    Neutrophils Relative % 80 (*)    Neutro Abs 12.4 (*)    Lymphocytes Relative 9 (*)    Monocytes Absolute 1.5 (*)    All other components within normal limits  COMPREHENSIVE METABOLIC PANEL - Abnormal; Notable for the following:    Glucose, Bld 149 (*)     Creatinine, Ser 1.67 (*)    Albumin 3.4 (*)    ALT 13 (*)    GFR calc non Af Amer 27 (*)    GFR calc Af Amer 32 (*)    All other components within normal limits  URINALYSIS, ROUTINE W REFLEX MICROSCOPIC (NOT AT [facility]) - Abnormal; Notable for the following:    APPearance CLOUDY (*)    Hgb urine dipstick LARGE (*)    Protein, ur 30 (*)    Leukocytes, UA LARGE (*)    All other components within normal limits  URINE MICROSCOPIC-ADD ON - Abnormal; Notable for the following:    Bacteria, UA FEW (*)    All other components within normal limits  CBG MONITORING, ED - Abnormal; Notable for the following:    Glucose-Capillary 131 (*)    All other components within normal limits  CULTURE, BLOOD (ROUTINE X 2)  CULTURE, BLOOD (ROUTINE X 2)  URINE CULTURE  AMMONIA  CBC  CREATININE, SERUM  MAGNESIUM  TSH  TROPONIN I  TROPONIN I  TROPONIN I  COMPREHENSIVE METABOLIC PANEL  CBC  I-STAT CG4 LACTIC ACID, ED    Imaging Review Dg Chest 2 View  03/16/2015   CLINICAL DATA:  Decreased appetite and mental status changes.  EXAM: CHEST  2 VIEW  COMPARISON:  03/02/2015  FINDINGS: The cardiac silhouette, mediastinal and hilar contours are within normal limits and stable. There is moderate tortuosity, ectasia and calcification of the thoracic aorta. Low lung volumes with vascular crowding and bibasilar atelectasis. No definite infiltrates or effusions. The bony thorax is intact. Stable degenerative changes involving both shoulders.  IMPRESSION: Low lung volumes with vascular crowding and bibasilar atelectasis. No definite infiltrates or effusions.   Electronically Signed   By: Marijo Sanes M.D.   On: 03/16/2015 16:03   Ct Head Wo Contrast  03/16/2015   CLINICAL DATA:  Weakness and decline in appetite, abnormal behavior. Dementia, altered mental status.  EXAM: CT HEAD WITHOUT CONTRAST  TECHNIQUE: Contiguous axial images were obtained from the base of the skull through the vertex without intravenous contrast.   COMPARISON:  01/02/2011.  FINDINGS: No evidence of an acute infarct, acute hemorrhage, mass lesion, mass effect or hydrocephalus. Atrophy. Periventricular low attenuation. Visualized portions of the paranasal sinuses and mastoid air cells are clear.  IMPRESSION: 1. No acute intracranial abnormality. 2. Atrophy and chronic microvascular white matter ischemic changes.   Electronically Signed   By: Lorin Picket M.D.   On: 03/16/2015 16:12   I have personally reviewed and evaluated these images and lab results as part of my medical decision-making.   EKG Interpretation   Date/Time:  Wednesday March 16 2015 14:45:10 EDT Ventricular Rate:  94 PR Interval:  165 QRS Duration: 76 QT Interval:  352 QTC Calculation: 440 R Axis:   -21 Text Interpretation:  Sinus rhythm LVH with secondary repolarization  abnormality Inferior infarct, old Anterior Q waves, possibly due to LVH No  significant change since last tracing Confirmed by Debby Freiberg [phone])  on 03/16/2015 3:25:36 PM      MDM   Final diagnoses:  None    81 y.o. female with pertinent PMH of dementia presents with concern for altered mental status.  Physical exam on arrival as above.  No focal neuro deficits on my exam, pt pleasantly disoriented.    Elba Barman revealed UTI, which is likely source of lethargy and AMS.  Symptoms improving spontaneously.  Doubt CVA or TIA given shaking history.  Admitted in stable condition  I have reviewed all laboratory and imaging studies if ordered as above  No diagnosis found.      Debby Freiberg, MD 03/16/15 Carollee Massed

## 2015-03-16 NOTE — Progress Notes (Signed)
CT called this PM to ask if patient would be able to drink oral contrast for CT Abdomen and Pelvis. Pt is barely responsive to this RN during assessment only opening her eyes and murmuring words occasionally with confused answers. Will hold contrast until patient is more alert. Will continue to monitor.

## 2015-03-16 NOTE — ED Notes (Addendum)
EMS reports from husband that patient hasn't been herself for the past three weeks.  Husband reports weakness and decline in appetite.  Patient has not fallen, declines LOC,  and bilateral hip replacement 1991 and 2001.  Per family, patient has had several doctor visits in the past couple weeks to check for bladder infection.  Results came back negative.  Patient has dementia and altered mental status.  Per family members patient talks, walks with walker, and eat without any help.  CBG 135 BP: 120/90 HR: 108  96% room air

## 2015-03-16 NOTE — H&P (Signed)
[facility] History and Physical  [name] JJK:[id number] DOB: 1930/11/27 DOA: 03/16/2015  Referring physician:  PCP: Cristina Pear., NP   Chief Complaint: ams   HPI:  [name] is an 81 yr old female with  Depression,anxiety and obesity who presents with lethargy, poor oral intake .She also has had right flank pain,  She is brought today by her husband. Her husband is her primary care giver. He is on home oxygen and is not in great health.  Husband reports that for the past 7  days, she has been sleeping more and not doing her regular activities (patient usually enjoys coloring but has not colored over the last 2 days). He reports that while she is taking in PO's, her PO intake has been poor. Patient has been sleeping more than usual and staying in the bed most of the day. Only has been getting up to use the bathroom.    Also presents with Sacral decub- husband reports that he has been applying desitin cream and nystatin powder. Marland Kitchen He tells me that her skin looks "better" than it did last time.   Recurrent UTI- Pt has rx for prophylactic daily macrodantin on her list which was originally started by urology. Husband shows me their med list and it appears that she was on cipro from 8/19 for 5 days 250mg  BID . Found to have a florid UTI in the ED,possible pyelo, cr 1.67      Review of Systems: negative for the following  Constitutional: Denies fever, chills, diaphoresis, appetite change and fatigue.  HEENT: Denies photophobia, eye pain, redness, hearing loss, ear pain, congestion, sore throat, rhinorrhea, sneezing, mouth sores, trouble swallowing, neck pain, neck stiffness and tinnitus.  Respiratory: Denies SOB, DOE, cough, chest tightness, and wheezing.  Cardiovascular: Denies chest pain, palpitations and leg swelling.  Gastrointestinal: Denies nausea, vomiting, abdominal pain, diarrhea, constipation, blood in stool and abdominal distention.  Genitourinary:  Denies dysuria, urgency, frequency, hematuria, flank pain and difficulty urinating.  Musculoskeletal: Denies myalgias, back pain, joint swelling, arthralgias and gait problem.  Skin: Denies pallor, rash and wound.  Neurological:  Weakness,  dizziness, seizures, syncope, weakness, light-headedness, numbness and headaches.  Hematological: Denies adenopathy. Easy bruising, personal or family bleeding history  Psychiatric/Behavioral: Denies suicidal ideation, mood changes, confusion, nervousness, sleep disturbance and agitation       Past Medical History  Diagnosis Date  . Depressive disorder, not elsewhere classified   . Anxiety state, unspecified   . Benign neoplasm of colon   . Diverticulitis of colon (without mention of hemorrhage)   . Mixed hyperlipidemia   . Obesity, unspecified   . Unspecified essential hypertension   . Chronic rhinitis   . GERD   . INTERTRIGO, CANDIDAL   . Major neurocognitive disorder due to Alzheimer's disease, probable, without behavioral disturbance     Followed by Dr. Brett Fairy at [facility].   Marland Kitchen PRESSURE ULCER STAGE I     sacral, recurrent  . Arthritis   . Bladder infection      Past Surgical History  Procedure Laterality Date  . Cholecystectomy    . Total hip arthroplasty Bilateral 1991, 2001  . Vesicovaginal fistula closure w/ tah    . Rotator cuff repair    . Abdominal hysterectomy  1980  . Cesarean section        Social History:  reports that she quit smoking about 56 years ago. Her smoking use included Cigarettes. She has a .4 pack-year smoking history. She has never  used smokeless tobacco. She reports that she does not drink alcohol or use illicit drugs.    Allergies  Allergen Reactions  . Penicillins     REACTION: adolesent    Family History  Problem Relation Age of Onset  . Breast cancer Mother   . Colon cancer Mother          Prior to Admission medications   Medication Sig Start Date End Date Taking?  Authorizing Provider  ALPRAZolam Duanne Moron) 0.25 MG tablet Take one table by mouth 3 times daily as needed for agitation or insomnia. 02/09/15  Yes Debbrah Alar, NP  aspirin 325 MG EC tablet Take 325 mg by mouth daily.     Yes Historical Provider, MD  citalopram (CELEXA) 20 MG tablet Take 1 tablet (20 mg total) by mouth daily. 02/09/15  Yes Debbrah Alar, NP  Memantine HCl-Donepezil HCl (NAMZARIC) 28-10 MG CP24 Take 1 tablet by mouth daily. 12/16/14  Yes Carmen Dohmeier, MD  mometasone (NASONEX) 50 MCG/ACT nasal spray Place 1-2 sprays into the nose daily.   Yes Historical Provider, MD  Multiple Vitamin (MULTIVITAMIN WITH MINERALS) TABS tablet Take 1 tablet by mouth daily.   Yes Historical Provider, MD  nitrofurantoin (MACRODANTIN) 100 MG capsule Take 1 capsule (100 mg total) by mouth daily. 02/09/15  Yes Debbrah Alar, NP  OVER THE COUNTER MEDICATION Azo flush  'cranberry medication' reported by patient's husband. Take one daily   Yes Historical Provider, MD  oxybutynin (DITROPAN) 5 MG tablet Take 1 tablet (5 mg total) by mouth 3 (three) times daily. 02/09/15  Yes Debbrah Alar, NP  pantoprazole (PROTONIX) 40 MG tablet Take 1 tablet (40 mg total) by mouth daily before breakfast. 02/09/15  Yes Debbrah Alar, NP  potassium chloride SA (KLOR-CON M20) 20 MEQ tablet Take 1 tablet (20 mEq total) by mouth daily. 10/06/14  Yes Debbrah Alar, NP  PRESCRIPTION MEDICATION Prescription pain killer. Patient's husband does not remember the name. [facility] has not dispensed pain killer medication recently. Last dose last night (03/15/15)   Yes Historical Provider, MD  protective barrier (RESTORE) CREA Apply twice daily to upper buttocks 02/09/15  Yes Debbrah Alar, NP  ciprofloxacin (CIPRO) 250 MG tablet Take 1 tablet (250 mg total) by mouth 2 (two) times daily. Patient not taking: Reported on 03/16/2015 03/02/15   Debbrah Alar, NP  polyethylene glycol powder (GLYCOLAX/MIRALAX) powder For  constipation you can try a small amount of miralax (i.e. One teaspoon) in 8 oz of juice once daily. If diarrhea, give every day. May increase to a full cap once daily as needed. Patient not taking: Reported on 03/16/2015 02/09/15   Debbrah Alar, NP     Physical Exam: Filed Vitals:   03/16/15 1443 03/16/15 1448 03/16/15 1654  BP: 161/78  158/95  Pulse: 96  80  Temp: 97.6 F (36.4 C)  97.5 F (36.4 C)  TempSrc: Oral  Oral  Resp: 15  15  SpO2: 95% 94% 97%     Constitutional: Vital signs reviewed. Patient is obese chronically ill appearing ,Head: Normocephalic and atraumatic  Ear: TM normal bilaterally  Mouth: no erythema or exudates, dry mucosa  Eyes: PERRL, EOMI, conjunctivae normal, No scleral icterus.  Neck: Supple, Trachea midline normal ROM, No JVD, mass, thyromegaly, or carotid bruit present.  Cardiovascular: RRR, S1 normal, S2 normal, no MRG, pulses symmetric and intact bilaterally  Pulmonary/Chest: CTAB, no wheezes, rales, or rhonchi  Abdominal: Soft. Non-tender, non-distended, bowel sounds are normal, no masses, organomegaly, or guarding present.  GU: no  CVA tenderness Musculoskeletal: No joint deformities, erythema, or stiffness, ROM full and no nontender Ext: no edema and no cyanosis, pulses palpable bilaterally (DP and PT)  Hematology: no cervical, inginal, or axillary adenopathy.  Neurological: A&O x3, Strenght is normal and symmetric bilaterally, cranial nerve II-XII are grossly intact, no focal motor deficit, sensory intact to light touch bilaterally.  Skin: Warm, dry and intact. No rash, cyanosis, or clubbing.  Psychiatric: Normal mood and affect. speech and behavior is normal. Judgment and thought content normal. Cognition and memory are normal.      Data Review   Micro Results No results found for this or any previous visit (from the past 240 hour(s)).  Radiology Reports Dg Chest 2 View  03/16/2015   CLINICAL DATA:  Decreased appetite and mental status  changes.  EXAM: CHEST  2 VIEW  COMPARISON:  03/02/2015  FINDINGS: The cardiac silhouette, mediastinal and hilar contours are within normal limits and stable. There is moderate tortuosity, ectasia and calcification of the thoracic aorta. Low lung volumes with vascular crowding and bibasilar atelectasis. No definite infiltrates or effusions. The bony thorax is intact. Stable degenerative changes involving both shoulders.  IMPRESSION: Low lung volumes with vascular crowding and bibasilar atelectasis. No definite infiltrates or effusions.   Electronically Signed   By: Marijo Sanes M.D.   On: 03/16/2015 16:03   Dg Chest 2 View  03/02/2015   CLINICAL DATA:  Somnolence.  Alzheimer's disease.  Depression.  EXAM: CHEST  2 VIEW  COMPARISON:  08/23/2014  FINDINGS: There is no focal parenchymal opacity. There is no pleural effusion or pneumothorax. The heart and mediastinal contours are unremarkable.  There is prior right rotator cuff repair. There is moderate osteoarthritis of bilateral glenohumeral joints.  IMPRESSION: No active cardiopulmonary disease.   Electronically Signed   By: Kathreen Devoid   On: 03/02/2015 16:24   Ct Head Wo Contrast  03/16/2015   CLINICAL DATA:  Weakness and decline in appetite, abnormal behavior. Dementia, altered mental status.  EXAM: CT HEAD WITHOUT CONTRAST  TECHNIQUE: Contiguous axial images were obtained from the base of the skull through the vertex without intravenous contrast.  COMPARISON:  01/02/2011.  FINDINGS: No evidence of an acute infarct, acute hemorrhage, mass lesion, mass effect or hydrocephalus. Atrophy. Periventricular low attenuation. Visualized portions of the paranasal sinuses and mastoid air cells are clear.  IMPRESSION: 1. No acute intracranial abnormality. 2. Atrophy and chronic microvascular white matter ischemic changes.   Electronically Signed   By: Lorin Picket M.D.   On: 03/16/2015 16:12     CBC  Recent Labs Lab 03/16/15 1528  WBC 15.4*  HGB 15.1*  HCT  45.0  PLT 150  MCV 99.8  MCH 33.5  MCHC 33.6  RDW 14.5  LYMPHSABS 1.3  MONOABS 1.5*  EOSABS 0.2  BASOSABS 0.0    Chemistries   Recent Labs Lab 03/16/15 1528  NA 139  K 4.3  CL 101  CO2 30  GLUCOSE 149*  BUN 19  CREATININE 1.67*  CALCIUM 9.1  AST 32  ALT 13*  ALKPHOS 76  BILITOT 0.9   ------------------------------------------------------------------------------------------------------------------ estimated creatinine clearance is 22.8 mL/min (by C-G formula based on Cr of 1.67). ------------------------------------------------------------------------------------------------------------------ No results for input(s): HGBA1C in the last 72 hours. ------------------------------------------------------------------------------------------------------------------ No results for input(s): CHOL, HDL, LDLCALC, TRIG, CHOLHDL, LDLDIRECT in the last 72 hours. ------------------------------------------------------------------------------------------------------------------ No results for input(s): TSH, T4TOTAL, T3FREE, THYROIDAB in the last 72 hours.  Invalid input(s): FREET3 ------------------------------------------------------------------------------------------------------------------ No results for input(s): VITAMINB12, FOLATE,  FERRITIN, TIBC, IRON, RETICCTPCT in the last 72 hours.  Coagulation profile No results for input(s): INR, PROTIME in the last 168 hours.  No results for input(s): DDIMER in the last 72 hours.  Cardiac Enzymes No results for input(s): CKMB, TROPONINI, MYOGLOBIN in the last 168 hours.  Invalid input(s): CK ------------------------------------------------------------------------------------------------------------------ Invalid input(s): POCBNP   CBG:  Recent Labs Lab 03/16/15 1532  GLUCAP 131*       EKG: Independently reviewed.     Assessment/Plan  UTI possible pyelonephritis " started on rocephin , Blood Culture , Urine culture   drawn , started on rocephin, will obtain CT to r/o pyelo and nephrolithiasis   Alzheimer's disease- without behavioral disturbance. continue home meds , followed by [facility]   Dementia degree is severe per neurology note 6/02  AKI : creatinine has increased from 1.01 >1.67 , likely prerenal , started on IVF    Code Status:   full Family Communication: bedside Disposition Plan: admit   Total time spent 55 minutes.Greater than 50% of this time was spent in counseling, explanation of diagnosis, planning of further management, and coordination of care  [facility] Pager [phone]  If 7PM-7AM, please contact night-coverage [email or web address] Password [facility] 03/16/2015, 6:44 PM

## 2015-03-16 NOTE — ED Notes (Signed)
Lab at bedside

## 2015-03-17 DIAGNOSIS — N200 Calculus of kidney: Secondary | ICD-10-CM

## 2015-03-17 DIAGNOSIS — B377 Candidal sepsis: Secondary | ICD-10-CM | POA: Diagnosis not present

## 2015-03-17 DIAGNOSIS — N12 Tubulo-interstitial nephritis, not specified as acute or chronic: Secondary | ICD-10-CM | POA: Diagnosis present

## 2015-03-17 DIAGNOSIS — N1330 Unspecified hydronephrosis: Secondary | ICD-10-CM | POA: Diagnosis present

## 2015-03-17 LAB — APTT: aPTT: 32 seconds (ref 24–37)

## 2015-03-17 LAB — CBC
HEMATOCRIT: 38.8 % (ref 36.0–46.0)
HEMOGLOBIN: 12.9 g/dL (ref 12.0–15.0)
MCH: 33 pg (ref 26.0–34.0)
MCHC: 33.2 g/dL (ref 30.0–36.0)
MCV: 99.2 fL (ref 78.0–100.0)
Platelets: 124 10*3/uL — ABNORMAL LOW (ref 150–400)
RBC: 3.91 MIL/uL (ref 3.87–5.11)
RDW: 14.6 % (ref 11.5–15.5)
WBC: 16.3 10*3/uL — AB (ref 4.0–10.5)

## 2015-03-17 LAB — COMPREHENSIVE METABOLIC PANEL
ALT: 20 U/L (ref 14–54)
ANION GAP: 7 (ref 5–15)
AST: 36 U/L (ref 15–41)
Albumin: 2.8 g/dL — ABNORMAL LOW (ref 3.5–5.0)
Alkaline Phosphatase: 67 U/L (ref 38–126)
BILIRUBIN TOTAL: 0.7 mg/dL (ref 0.3–1.2)
BUN: 17 mg/dL (ref 6–20)
CO2: 27 mmol/L (ref 22–32)
Calcium: 8.2 mg/dL — ABNORMAL LOW (ref 8.9–10.3)
Chloride: 105 mmol/L (ref 101–111)
Creatinine, Ser: 1.54 mg/dL — ABNORMAL HIGH (ref 0.44–1.00)
GFR, EST AFRICAN AMERICAN: 35 mL/min — AB (ref >60)
GFR, EST NON AFRICAN AMERICAN: 30 mL/min — AB (ref >60)
Glucose, Bld: 125 mg/dL — ABNORMAL HIGH (ref 65–99)
POTASSIUM: 3.9 mmol/L (ref 3.5–5.1)
Sodium: 139 mmol/L (ref 135–145)
TOTAL PROTEIN: 6.3 g/dL — AB (ref 6.5–8.1)

## 2015-03-17 LAB — TROPONIN I
TROPONIN I: 0.04 ng/mL — AB (ref <0.031)
Troponin I: 0.03 ng/mL (ref <0.031)

## 2015-03-17 LAB — PROTIME-INR
INR: 1.34 (ref 0.00–1.49)
PROTHROMBIN TIME: 16.7 s — AB (ref 11.6–15.2)

## 2015-03-17 NOTE — Care Management Note (Signed)
Case Management Note  Patient Details  Name: [name] MRN: [id number] Date of Birth: 1931/04/09  Subjective/Objective:  81 y/o f admitted w/AMS, UTI. From home w/spouse. Will confirm if active w/[facility].PT cons ordered-await recommendations.                  Action/Plan:Monitor progress for d/c needs.   Expected Discharge Date:   (unknown)               Expected Discharge Plan:  [place]  In-House Referral:     Discharge planning Services  CM Consult  Post Acute Care Choice:  [place] (Active w/[facility]) Choice offered to:     DME Arranged:    DME Agency:     HH Arranged:    HH Agency:     Status of Service:  In process, will continue to follow  Medicare Important Message Given:    Date Medicare IM Given:    Medicare IM give by:    Date Additional Medicare IM Given:    Additional Medicare Important Message give by:     If discussed at [place] of Stay Meetings, dates discussed:    Additional Comments:  Dessa Phi, RN 03/17/2015, 3:00 PM

## 2015-03-17 NOTE — Progress Notes (Signed)
[facility] PROGRESS NOTE  [name] NWG:[id number] DOB: 08-03-1930 DOA: 03/16/2015 PCP: Nance Pear., NP  Assessment/Plan: Acute Encephalopathy/Alzheimer's Dementia -2/2 UTI on top of baseline dementia. -Per family members, she is still worse than baseline. -Per neurology notes, has severe dementia.  UTI/Pyelonephritis -Continue rocephin pending cx data. -Discussed case with Dr. Jeffie Pollock, GU, as CT shows a 2 cm left ureteropelvic junction calculus causing moderate hydronephrosis.Will likely need IR for percutaneous nephrostomy. Dr. Jeffie Pollock to see later today and provide further recommendations.  ARF -Improving with IVF. -Suspect obstructing stone may be playing a role as well.  Code Status: Full Code Family Communication: Discussed with husband and daughter at bedside  Disposition Plan: To be determined. Will request PT evaluation as she may need SNF upon DC.   Consultants:  Dr. Jeffie Pollock, urology   Antibiotics:  Rocephin   Subjective: In bed, can answer simple questions. No complaints.  Objective: Filed Vitals:   03/16/15 1654 03/16/15 1913 03/17/15 0642 03/17/15 1325  BP: 158/95 152/76 127/46 142/64  Pulse: 80 80 81 81  Temp: 97.5 F (36.4 C) 98.7 F (37.1 C) 98.9 F (37.2 C) 98.7 F (37.1 C)  TempSrc: Oral Axillary Oral Axillary  Resp: 15  20 20   Height:  4' 11.5" (1.511 m)    Weight:  72.893 kg (160 lb 11.2 oz)    SpO2: 97% 95% 94% 96%    Intake/Output Summary (Last 24 hours) at 03/17/15 1456 Last data filed at 03/17/15 0900  Gross per 24 hour  Intake 1038.75 ml  Output      0 ml  Net 1038.75 ml   Filed Weights   03/16/15 1913  Weight: 72.893 kg (160 lb 11.2 oz)    Exam:   General:  Drowsy, rouses easily to voice  Cardiovascular: RRR, strong systolic murmur  Respiratory: CTA B  Abdomen: obese/S/NT/ND/+BS  Extremities: trace bilateral edema   Neurologic:  Unable to fully assess given dementia  Data Reviewed: Basic  Metabolic Panel:  Recent Labs Lab 03/16/15 1528 03/16/15 2050 03/17/15 0515  NA 139  --  139  K 4.3  --  3.9  CL 101  --  105  CO2 30  --  27  GLUCOSE 149*  --  125*  BUN 19  --  17  CREATININE 1.67*  --  1.54*  CALCIUM 9.1  --  8.2*  MG  --  1.8  --    Liver Function Tests:  Recent Labs Lab 03/16/15 1528 03/17/15 0515  AST 32 36  ALT 13* 20  ALKPHOS 76 67  BILITOT 0.9 0.7  PROT 7.9 6.3*  ALBUMIN 3.4* 2.8*   No results for input(s): LIPASE, AMYLASE in the last 168 hours.  Recent Labs Lab 03/16/15 1529  AMMONIA 21   CBC:  Recent Labs Lab 03/16/15 1528 03/17/15 0515  WBC 15.4* 16.3*  NEUTROABS 12.4*  --   HGB 15.1* 12.9  HCT 45.0 38.8  MCV 99.8 99.2  PLT 150 124*   Cardiac Enzymes:  Recent Labs Lab 03/16/15 2050 03/17/15 0515 03/17/15 1130  TROPONINI <0.03 0.03 0.04*   BNP (last 3 results) No results for input(s): BNP in the last 8760 hours.  ProBNP (last 3 results)  Recent Labs  07/02/14 1838  PROBNP 470.0*    CBG:  Recent Labs Lab 03/16/15 1532  GLUCAP 131*    Recent Results (from the past 240 hour(s))  Urine culture     Status: None (Preliminary result)   Collection Time:  03/16/15  2:32 PM  Result Value Ref Range Status   Specimen Description URINE, CATHETERIZED  Final   Special Requests NONE  Final   Culture   Final    CULTURE REINCUBATED FOR BETTER GROWTH Performed at [facility]    Report Status PENDING  Incomplete  Blood culture (routine x 2)     Status: None (Preliminary result)   Collection Time: 03/16/15  5:15 PM  Result Value Ref Range Status   Specimen Description BLOOD LEFT HAND  Final   Special Requests IN PEDIATRIC BOTTLE .5CC  Final   Culture   Final    NO GROWTH < 24 HOURS Performed at [facility]    Report Status PENDING  Incomplete     Studies: Ct Abdomen Pelvis Wo Contrast  03/16/2015   CLINICAL DATA:  Pyelonephritis  EXAM: CT ABDOMEN AND PELVIS WITHOUT CONTRAST  TECHNIQUE:  Multidetector CT imaging of the abdomen and pelvis was performed following the standard protocol without IV contrast.  COMPARISON:  01/27/2008  FINDINGS: Lower chest: Streak artifact from patient's hands overlying the upper abdomen noted. Trace left greater than right pleural fluid with associated compressive atelectasis. Lung bases are otherwise clear.  Hepatobiliary: Common duct fusiform dilation at its midportion measuring 1.2 cm maximally, with tapering to the ampulla noted. Liver is grossly unremarkable.  Pancreas: Fatty infiltrated and atrophic but grossly unremarkable otherwise.  Spleen: Normal  Adrenals/Urinary Tract: Adrenal glands appear normal. Right sided extrarenal pelvis noted but the right kidney is otherwise grossly unremarkable. 2.0 cm left ureteropelvic junction calculus image 40, with moderate left hydronephrosis. Left mid renal calculus measures 1.3 cm image 41. Trace left perinephric fluid/stranding.  Stomach/Bowel: Colonic diverticuli noted without evidence for diverticulitis. No bowel wall thickening or focal segmental dilatation is identified.  Vascular/Lymphatic: Moderate atheromatous aortic calcification without aneurysm. Small retroperitoneal nodes are identified measuring less than 1 cm in diameter.  Other: Ovaries are unremarkable. Detail in the anatomic pelvis is obscured by streak artifact from bilateral hip arthroplasty.  No free air. Left greater than right fat containing inguinal hernias.  Musculoskeletal: Multilevel severe degenerative change of the lumbar spine. No acute osseous abnormality.  IMPRESSION: 2 cm left ureteropelvic junction calculus producing moderate left hydronephrosis.   Electronically Signed   By: Conchita Paris M.D.   On: 03/16/2015 21:48   Dg Chest 2 View  03/16/2015   CLINICAL DATA:  Decreased appetite and mental status changes.  EXAM: CHEST  2 VIEW  COMPARISON:  03/02/2015  FINDINGS: The cardiac silhouette, mediastinal and hilar contours are within normal  limits and stable. There is moderate tortuosity, ectasia and calcification of the thoracic aorta. Low lung volumes with vascular crowding and bibasilar atelectasis. No definite infiltrates or effusions. The bony thorax is intact. Stable degenerative changes involving both shoulders.  IMPRESSION: Low lung volumes with vascular crowding and bibasilar atelectasis. No definite infiltrates or effusions.   Electronically Signed   By: Marijo Sanes M.D.   On: 03/16/2015 16:03   Ct Head Wo Contrast  03/16/2015   CLINICAL DATA:  Weakness and decline in appetite, abnormal behavior. Dementia, altered mental status.  EXAM: CT HEAD WITHOUT CONTRAST  TECHNIQUE: Contiguous axial images were obtained from the base of the skull through the vertex without intravenous contrast.  COMPARISON:  01/02/2011.  FINDINGS: No evidence of an acute infarct, acute hemorrhage, mass lesion, mass effect or hydrocephalus. Atrophy. Periventricular low attenuation. Visualized portions of the paranasal sinuses and mastoid air cells are clear.  IMPRESSION: 1.  No acute intracranial abnormality. 2. Atrophy and chronic microvascular white matter ischemic changes.   Electronically Signed   By: Lorin Picket M.D.   On: 03/16/2015 16:12    Scheduled Meds: . aspirin  325 mg Oral Daily  . cefTRIAXone (ROCEPHIN)  IV  1 g Intravenous Q24H  . citalopram  20 mg Oral Daily  . donepezil  10 mg Oral Daily  . feeding supplement (ENSURE ENLIVE)  237 mL Oral BID BM  . heparin subcutaneous  5,000 Units Subcutaneous 3 times per day  . memantine  28 mg Oral Daily  . oxybutynin  5 mg Oral TID  . pantoprazole  40 mg Oral QAC breakfast  . sodium chloride  3 mL Intravenous Q12H   Continuous Infusions: . sodium chloride 75 mL/hr at 03/17/15 0954    Active Problems:   UTI (lower urinary tract infection)   Altered mental state    Time spent: 25 minutes. Greater than 50% of this time was spent in direct contact with the patient coordinating  care.    Lelon Frohlich  [facility] Pager [phone]  If 7PM-7AM, please contact night-coverage at [email or web address], password [facility] 03/17/2015, 2:56 PM  LOS: 1 day

## 2015-03-17 NOTE — Consult Note (Signed)
Chief Complaint: Patient was seen in consultation today for left percutaneous nephrostomy Chief Complaint  Patient presents with  . Altered Mental Status    Referring Physician(s): Wrenn/Budzyn  History of Present Illness: [name] is a 81 y.o. female with past medical history significant for obesity, depression, anxiety, Alzheimer's dementia, GERD, sacral decubitus ulcer, diverticular disease, recurrent UTIs was admitted to the hospital on 8/31 with diminished oral intake, lethargy/diminished activity level, and flank pain. CT head was negative for any acute abnormality. CT of the abdomen/pelvis on 8/31 revealed a 2 cm left ureteropelvic junction calculus producing moderate left hydronephrosis. Patient was also noted to have leukocytosis and elevated creatinine levels. Current temperature is 102.3. Blood and urine cultures are pending. Patient was seen by urology and request is now made for left percutaneous nephrostomy.  Past Medical History  Diagnosis Date  . Depressive disorder, not elsewhere classified   . Anxiety state, unspecified   . Benign neoplasm of colon   . Diverticulitis of colon (without mention of hemorrhage)   . Mixed hyperlipidemia   . Obesity, unspecified   . Unspecified essential hypertension   . Chronic rhinitis   . GERD   . INTERTRIGO, CANDIDAL   . Major neurocognitive disorder due to Alzheimer's disease, probable, without behavioral disturbance     Followed by Dr. Brett Fairy at [facility].   Marland Kitchen PRESSURE ULCER STAGE I     sacral, recurrent  . Arthritis   . Bladder infection     Past Surgical History  Procedure Laterality Date  . Cholecystectomy    . Total hip arthroplasty Bilateral 1991, 2001  . Vesicovaginal fistula closure w/ tah    . Rotator cuff repair    . Abdominal hysterectomy  1980  . Cesarean section      Allergies: Penicillins  Medications: Prior to Admission medications   Medication Sig Start Date End Date Taking?  Authorizing Provider  ALPRAZolam Duanne Moron) 0.25 MG tablet Take one table by mouth 3 times daily as needed for agitation or insomnia. 02/09/15  Yes Debbrah Alar, NP  aspirin 325 MG EC tablet Take 325 mg by mouth daily.     Yes Historical Provider, MD  citalopram (CELEXA) 20 MG tablet Take 1 tablet (20 mg total) by mouth daily. 02/09/15  Yes Debbrah Alar, NP  Memantine HCl-Donepezil HCl (NAMZARIC) 28-10 MG CP24 Take 1 tablet by mouth daily. 12/16/14  Yes Carmen Dohmeier, MD  mometasone (NASONEX) 50 MCG/ACT nasal spray Place 1-2 sprays into the nose daily.   Yes Historical Provider, MD  Multiple Vitamin (MULTIVITAMIN WITH MINERALS) TABS tablet Take 1 tablet by mouth daily.   Yes Historical Provider, MD  nitrofurantoin (MACRODANTIN) 100 MG capsule Take 1 capsule (100 mg total) by mouth daily. 02/09/15  Yes Debbrah Alar, NP  OVER THE COUNTER MEDICATION Azo flush  'cranberry medication' reported by patient's husband. Take one daily   Yes Historical Provider, MD  oxybutynin (DITROPAN) 5 MG tablet Take 1 tablet (5 mg total) by mouth 3 (three) times daily. 02/09/15  Yes Debbrah Alar, NP  pantoprazole (PROTONIX) 40 MG tablet Take 1 tablet (40 mg total) by mouth daily before breakfast. 02/09/15  Yes Debbrah Alar, NP  potassium chloride SA (KLOR-CON M20) 20 MEQ tablet Take 1 tablet (20 mEq total) by mouth daily. 10/06/14  Yes Debbrah Alar, NP  PRESCRIPTION MEDICATION Prescription pain killer. Patient's husband does not remember the name. [facility] has not dispensed pain killer medication recently. Last dose last night (03/15/15)  Yes Historical Provider, MD  protective barrier (RESTORE) CREA Apply twice daily to upper buttocks 02/09/15  Yes Debbrah Alar, NP  ciprofloxacin (CIPRO) 250 MG tablet Take 1 tablet (250 mg total) by mouth 2 (two) times daily. Patient not taking: Reported on 03/16/2015 03/02/15   Debbrah Alar, NP  polyethylene glycol powder (GLYCOLAX/MIRALAX) powder For  constipation you can try a small amount of miralax (i.e. One teaspoon) in 8 oz of juice once daily. If diarrhea, give every day. May increase to a full cap once daily as needed. Patient not taking: Reported on 03/16/2015 02/09/15   Debbrah Alar, NP     Family History  Problem Relation Age of Onset  . Breast cancer Mother   . Colon cancer Mother     Social History   Social History  . Marital Status: Married    Spouse Name: Quillian Quince  . Number of Children: 6  . Years of Education: 12   Occupational History  . Housewife    Social History Main Topics  . Smoking status: Former Smoker -- 0.20 packs/day for 2 years    Types: Cigarettes    Quit date: 07/16/1958  . Smokeless tobacco: Never Used     Comment: stopped 67 years ago  . Alcohol Use: No  . Drug Use: No  . Sexual Activity: Not Asked   Other Topics Concern  . None   Social History Narrative   Patient is married Quillian Quince) and lives with her husband.   Patient is a homemaker.   Patient has 6 children.   Patient is right-handed.   Patient has a high school education.   Patient does not drink any caffeine.      Review of Systems  see above(history obtained from patient's family)  Vital Signs: BP 142/64 mmHg  Pulse 81  Temp(Src) 102.3 F (39.1 C) (Rectal)  Resp 20  Ht 4' 11.5" (1.511 m)  Wt 160 lb 11.2 oz (72.893 kg)  BMI 31.93 kg/m2  SpO2 96%  Physical Exam patient lethargic but arousable; will respond briefly to few questions; chest- slightly diminished breath sounds bases, heart with regular rate and rhythm, soft murmur; abdomen obese, soft, positive bowel sounds, patient moans with palpation of left lateral abdomen/ flank region as well as right flank. Extremities without significant edema.  Mallampati Score:     Imaging: Ct Abdomen Pelvis Wo Contrast  03/16/2015   CLINICAL DATA:  Pyelonephritis  EXAM: CT ABDOMEN AND PELVIS WITHOUT CONTRAST  TECHNIQUE: Multidetector CT imaging of the abdomen and pelvis  was performed following the standard protocol without IV contrast.  COMPARISON:  01/27/2008  FINDINGS: Lower chest: Streak artifact from patient's hands overlying the upper abdomen noted. Trace left greater than right pleural fluid with associated compressive atelectasis. Lung bases are otherwise clear.  Hepatobiliary: Common duct fusiform dilation at its midportion measuring 1.2 cm maximally, with tapering to the ampulla noted. Liver is grossly unremarkable.  Pancreas: Fatty infiltrated and atrophic but grossly unremarkable otherwise.  Spleen: Normal  Adrenals/Urinary Tract: Adrenal glands appear normal. Right sided extrarenal pelvis noted but the right kidney is otherwise grossly unremarkable. 2.0 cm left ureteropelvic junction calculus image 40, with moderate left hydronephrosis. Left mid renal calculus measures 1.3 cm image 41. Trace left perinephric fluid/stranding.  Stomach/Bowel: Colonic diverticuli noted without evidence for diverticulitis. No bowel wall thickening or focal segmental dilatation is identified.  Vascular/Lymphatic: Moderate atheromatous aortic calcification without aneurysm. Small retroperitoneal nodes are identified measuring less than 1 cm in diameter.  Other: Ovaries  are unremarkable. Detail in the anatomic pelvis is obscured by streak artifact from bilateral hip arthroplasty.  No free air. Left greater than right fat containing inguinal hernias.  Musculoskeletal: Multilevel severe degenerative change of the lumbar spine. No acute osseous abnormality.  IMPRESSION: 2 cm left ureteropelvic junction calculus producing moderate left hydronephrosis.   Electronically Signed   By: Conchita Paris M.D.   On: 03/16/2015 21:48   Dg Chest 2 View  03/16/2015   CLINICAL DATA:  Decreased appetite and mental status changes.  EXAM: CHEST  2 VIEW  COMPARISON:  03/02/2015  FINDINGS: The cardiac silhouette, mediastinal and hilar contours are within normal limits and stable. There is moderate tortuosity,  ectasia and calcification of the thoracic aorta. Low lung volumes with vascular crowding and bibasilar atelectasis. No definite infiltrates or effusions. The bony thorax is intact. Stable degenerative changes involving both shoulders.  IMPRESSION: Low lung volumes with vascular crowding and bibasilar atelectasis. No definite infiltrates or effusions.   Electronically Signed   By: Marijo Sanes M.D.   On: 03/16/2015 16:03   Dg Chest 2 View  03/02/2015   CLINICAL DATA:  Somnolence.  Alzheimer's disease.  Depression.  EXAM: CHEST  2 VIEW  COMPARISON:  08/23/2014  FINDINGS: There is no focal parenchymal opacity. There is no pleural effusion or pneumothorax. The heart and mediastinal contours are unremarkable.  There is prior right rotator cuff repair. There is moderate osteoarthritis of bilateral glenohumeral joints.  IMPRESSION: No active cardiopulmonary disease.   Electronically Signed   By: Kathreen Devoid   On: 03/02/2015 16:24   Ct Head Wo Contrast  03/16/2015   CLINICAL DATA:  Weakness and decline in appetite, abnormal behavior. Dementia, altered mental status.  EXAM: CT HEAD WITHOUT CONTRAST  TECHNIQUE: Contiguous axial images were obtained from the base of the skull through the vertex without intravenous contrast.  COMPARISON:  01/02/2011.  FINDINGS: No evidence of an acute infarct, acute hemorrhage, mass lesion, mass effect or hydrocephalus. Atrophy. Periventricular low attenuation. Visualized portions of the paranasal sinuses and mastoid air cells are clear.  IMPRESSION: 1. No acute intracranial abnormality. 2. Atrophy and chronic microvascular white matter ischemic changes.   Electronically Signed   By: Lorin Picket M.D.   On: 03/16/2015 16:12    Labs:  CBC:  Recent Labs  07/06/14 0616 03/04/15 1346 03/16/15 1528 03/17/15 0515  WBC 8.1 8.8 15.4* 16.3*  HGB 13.6 14.9 15.1* 12.9  HCT 39.8 45.2 45.0 38.8  PLT 207 195.0 150 124*    COAGS:  Recent Labs  03/17/15 1620  INR 1.34  APTT  32    BMP:  Recent Labs  07/04/14 0530 07/05/14 0610 02/09/15 1525 03/02/15 1546 03/16/15 1528 03/17/15 0515  NA 138 141 140 142 139 139  K 4.2 5.0 4.0 3.8 4.3 3.9  CL 102 103 103 106 101 105  CO2 24 24 29 29 30 27   GLUCOSE 82 69* 131* 124* 149* 125*  BUN 13 16 18 20 19 17   CALCIUM 9.2 9.0 9.0 9.4 9.1 8.2*  CREATININE 0.94 0.96 1.09 1.01 1.67* 1.54*  GFRNONAA 55* 54*  --   --  27* 30*  GFRAA 64* 63*  --   --  32* 35*    LIVER FUNCTION TESTS:  Recent Labs  07/03/14 0737 03/02/15 1546 03/16/15 1528 03/17/15 0515  BILITOT 0.4 0.4 0.9 0.7  AST 15 15 32 36  ALT 5 7 13* 20  ALKPHOS 59 70 76 67  PROT 6.7  7.3 7.9 6.3*  ALBUMIN 2.9* 3.5 3.4* 2.8*    TUMOR MARKERS: No results for input(s): AFPTM, CEA, CA199, CHROMGRNA in the last 8760 hours.  Assessment and Plan: Patient with presumed left pyelonephritis and 2 cm left ureteropelvic junction calculus/moderate hydronephrosis, renal insufficiency, worsening mental status changes, leukocytosis, fever and flank pain. Blood/urine cultures pending. Request now received for left percutaneous nephrostomy. Imaging studies have been reviewed by Dr. Vernard Gambles. Patient had Ensure this afternoon therefore IV conscious sedation cannot be administered at this time. She also received SQ heparin at 1425 today.  We'll therefore plan nephrostomy for early a.m. 9/2. IR attending aware. Risks and benefits discussed with the patient's husband/daughter including, but not limited to infection, bleeding, significant bleeding causing loss or decrease in renal function or damage to adjacent structures and death. All of the patient's questions were answered, patient is agreeable to proceed.Consent signed and in chart.     Thank you for this interesting consult.  I greatly enjoyed meeting YARITZA LEIST and look forward to participating in their care.  A copy of this report was sent to the requesting provider on this date.  Signed: D. Rowe Robert 03/17/2015, 4:47 PM   I spent a total of 20 minutes in face to face in clinical consultation, greater than 50% of which was counseling/coordinating care for left percutaneous nephrostomy

## 2015-03-17 NOTE — Progress Notes (Signed)
Initial Nutrition Assessment  DOCUMENTATION CODES:   Obesity unspecified  INTERVENTION:  - Continue Ensure Enlive BID, each supplement provides 350 kcal and 20 grams of protein - Recommend Dysphagia 3 diet order - RD will continue to monitor for needs  NUTRITION DIAGNOSIS:   Inadequate oral intake related to poor appetite as evidenced by per patient/family report.  GOAL:   Patient will meet greater than or equal to 90% of their needs  MONITOR:   PO intake, Supplement acceptance, Labs, I & O's  REASON FOR ASSESSMENT:   Malnutrition Screening Tool  ASSESSMENT:   81 yr old female with Depression,anxiety and obesity who presents with lethargy, poor oral intake .She also has had right flank pain, She is brought today by her husband. Her husband is her primary care giver. He is on home oxygen and is not in great health. Husband reports that for the past 7 days, she has been sleeping more and not doing her regular a29 yr old female with Depression,anxiety and obesity who presents with lethargy, poor oral intake .She also has had right flank pain, She is brought today by her husband. Her husband is her primary care giver. He is on home oxygen and is not in great health. Husband reports that for the past 7 days, she has been sleeping more and not doing her regular activities (patient usually enjoys coloring but has not colored over the last 2 days). He reports that while she is taking in PO's, her PO intake has been poor. Patient has been sleeping more than usual and staying in the bed most of the day. Only has been getting up to use the bathroom. ctivities (patient usually enjoys coloring but has not colored over the last 2 days). He reports that while she is taking in PO's, her PO intake has been poor. Patient has been sleeping more than usual and staying in the bed most of the day. Only has been getting up to use the bathroom.   Pt seen for MST. BMI indicates obesity. Pt  with hx of dementia and was sleeping at time of RD visit. Spoke with pt's daughter who was at bedside and provided all information.  She reports that pt has a fair to poor appetite at baseline and typically "picks" at her food. She likes desserts and sweet items and does not eat vegetables often. Daughter reports that pt needs softer foods and that meats need to be cut into small pieces for pt to tolerate well. She states that pt sometimes has coughing with liquids but this varies.   PTA pt was drinking Ensure and daughter reports that pt drank this AM and enjoys this supplement.  No muscle or fat wasting noted. Daughter reports that pt may have lost 3-5 lbs in the past 1 month. Per chart review, pt has lost 4 lbs (2% body weight) in the past 1 month which is not significant for time frame.  Unsure if pt is meeting needs at this time but unlikely due to intakes reported at home. Medications reviewed. Labs reviewed; creatinine elevated, GFR: 27.   Diet Order:  Diet Heart Room service appropriate?: Yes; Fluid consistency:: Thin  Skin:  Wound (see comment) (Stage 1 sacral pressure ulcer)  Last BM:  PTA  Height:   Ht Readings from Last 1 Encounters:  03/16/15 4' 11.5" (1.511 m)    Weight:   Wt Readings from Last 1 Encounters:  03/16/15 160 lb 11.2 oz (72.893 kg)    Ideal Body Weight:  44.1 kg (kg)  BMI:  Body mass index is 31.93 kg/(m^2).  Estimated Nutritional Needs:   Kcal:  1245-1445  Protein:  52-62 grams  Fluid:  2-2.2 L/day  EDUCATION NEEDS:   No education needs identified at this time     Jarome Matin, RD, LDN Inpatient Clinical Dietitian Pager # [phone] After hours/weekend pager # [phone]

## 2015-03-17 NOTE — Consult Note (Addendum)
Urology Consult  Referring physician: Lelon Frohlich, MD Reason for referral: Left obstructing renal calculus  Chief Complaint: Left obstructing renal calculus  History of Present Illness:  81 year old female with a past medical history significant for renal calculi, UTI, and dementia who presented to the hospital with significantly altered mental status from baseline now with a 2 cm left obstructing ureteropelvic junction stone.  The patient is unable to provide a history, but her family was bedside and provided a detailed history.according to her daughter and husband, the patient's mental status has decreased significantly over the last few days, and her husband at her transfer to the hospital from home yesterday.  Since admission, the patient's white blood cell count has increased to 16.3.  Also, within the last few hours she spiked a fever to 102.3.  Past Medical History  Diagnosis Date  . Depressive disorder, not elsewhere classified   . Anxiety state, unspecified   . Benign neoplasm of colon   . Diverticulitis of colon (without mention of hemorrhage)   . Mixed hyperlipidemia   . Obesity, unspecified   . Unspecified essential hypertension   . Chronic rhinitis   . GERD   . INTERTRIGO, CANDIDAL   . Major neurocognitive disorder due to Alzheimer's disease, probable, without behavioral disturbance     Followed by Dr. Brett Fairy at [facility].   Marland Kitchen PRESSURE ULCER STAGE I     sacral, recurrent  . Arthritis   . Bladder infection    Past Surgical History  Procedure Laterality Date  . Cholecystectomy    . Total hip arthroplasty Bilateral 1991, 2001  . Vesicovaginal fistula closure w/ tah    . Rotator cuff repair    . Abdominal hysterectomy  1980  . Cesarean section      Medications: I have reviewed the patient's current medications. Allergies:  Allergies  Allergen Reactions  . Penicillins     REACTION: adolesent    Family History  Problem Relation Age of  Onset  . Breast cancer Mother   . Colon cancer Mother    Social History:  reports that she quit smoking about 56 years ago. Her smoking use included Cigarettes. She has a .4 pack-year smoking history. She has never used smokeless tobacco. She reports that she does not drink alcohol or use illicit drugs.  ROS Unable to be obtained 2/2 patient's mental status  Physical Exam:  Vital signs in last 24 hours: Temp:  [97.5 F (36.4 C)-102.3 F (39.1 C)] 102.3 F (39.1 C) (09/01 1500) Pulse Rate:  [80-81] 81 (09/01 1325) Resp:  [15-20] 20 (09/01 1325) BP: (127-158)/(46-95) 142/64 mmHg (09/01 1325) SpO2:  [94 %-97 %] 96 % (09/01 1325) Weight:  [72.893 kg (160 lb 11.2 oz)] 72.893 kg (160 lb 11.2 oz) (08/31 1913) Physical Exam  Cardiovascular: Normal rate.   Skin: Skin is warm and dry.    Gen:A&Ox0, sedated HEENT: [place]/AT Resp: Unlabored respirations Abd: Soft NT ND GU: No foley.  GU exam otherwise deferred   Laboratory Data:  Results for orders placed or performed during the hospital encounter of 03/16/15 (from the past 72 hour(s))  Urine culture     Status: None (Preliminary result)   Collection Time: 03/16/15  2:32 PM  Result Value Ref Range   Specimen Description URINE, CATHETERIZED    Special Requests NONE    Culture      CULTURE REINCUBATED FOR BETTER GROWTH Performed at [facility]    Report Status PENDING   Urinalysis, Routine w  reflex microscopic     Status: Abnormal   Collection Time: 03/16/15  3:23 PM  Result Value Ref Range   Color, Urine YELLOW YELLOW   APPearance CLOUDY (A) CLEAR   Specific Gravity, Urine 1.018 1.005 - 1.030   pH 6.5 5.0 - 8.0   Glucose, UA NEGATIVE NEGATIVE mg/dL   Hgb urine dipstick LARGE (A) NEGATIVE   Bilirubin Urine NEGATIVE NEGATIVE   Ketones, ur NEGATIVE NEGATIVE mg/dL   Protein, ur 30 (A) NEGATIVE mg/dL   Urobilinogen, UA 0.2 0.0 - 1.0 mg/dL   Nitrite NEGATIVE NEGATIVE   Leukocytes, UA LARGE (A) NEGATIVE  Urine  microscopic-add on     Status: Abnormal   Collection Time: 03/16/15  3:23 PM  Result Value Ref Range   WBC, UA TOO NUMEROUS TO COUNT <3 WBC/hpf   RBC / HPF 3-6 <3 RBC/hpf   Bacteria, UA FEW (A) RARE  CBC with Differential     Status: Abnormal   Collection Time: 03/16/15  3:28 PM  Result Value Ref Range   WBC 15.4 (H) 4.0 - 10.5 K/uL   RBC 4.51 3.87 - 5.11 MIL/uL   Hemoglobin 15.1 (H) 12.0 - 15.0 g/dL   HCT 45.0 36.0 - 46.0 %   MCV 99.8 78.0 - 100.0 fL   MCH 33.5 26.0 - 34.0 pg   MCHC 33.6 30.0 - 36.0 g/dL   RDW 14.5 11.5 - 15.5 %   Platelets 150 150 - 400 K/uL   Neutrophils Relative % 80 (H) 43 - 77 %   Neutro Abs 12.4 (H) 1.7 - 7.7 K/uL   Lymphocytes Relative 9 (L) 12 - 46 %   Lymphs Abs 1.3 0.7 - 4.0 K/uL   Monocytes Relative 10 3 - 12 %   Monocytes Absolute 1.5 (H) 0.1 - 1.0 K/uL   Eosinophils Relative 1 0 - 5 %   Eosinophils Absolute 0.2 0.0 - 0.7 K/uL   Basophils Relative 0 0 - 1 %   Basophils Absolute 0.0 0.0 - 0.1 K/uL  Comprehensive metabolic panel     Status: Abnormal   Collection Time: 03/16/15  3:28 PM  Result Value Ref Range   Sodium 139 135 - 145 mmol/L   Potassium 4.3 3.5 - 5.1 mmol/L   Chloride 101 101 - 111 mmol/L   CO2 30 22 - 32 mmol/L   Glucose, Bld 149 (H) 65 - 99 mg/dL   BUN 19 6 - 20 mg/dL   Creatinine, Ser 1.67 (H) 0.44 - 1.00 mg/dL   Calcium 9.1 8.9 - 10.3 mg/dL   Total Protein 7.9 6.5 - 8.1 g/dL   Albumin 3.4 (L) 3.5 - 5.0 g/dL   AST 32 15 - 41 U/L   ALT 13 (L) 14 - 54 U/L   Alkaline Phosphatase 76 38 - 126 U/L   Total Bilirubin 0.9 0.3 - 1.2 mg/dL   GFR calc non Af Amer 27 (L) >60 mL/min   GFR calc Af Amer 32 (L) >60 mL/min    Comment: (NOTE) The eGFR has been calculated using the CKD EPI equation. This calculation has not been validated in all clinical situations. eGFR's persistently <60 mL/min signify possible Chronic Kidney Disease.    Anion gap 8 5 - 15  Ammonia     Status: None   Collection Time: 03/16/15  3:29 PM  Result Value  Ref Range   Ammonia 21 9 - 35 umol/L  CBG monitoring, ED     Status: Abnormal   Collection Time: 03/16/15  3:32 PM  Result Value Ref Range   Glucose-Capillary 131 (H) 65 - 99 mg/dL  Blood culture (routine x 2)     Status: None (Preliminary result)   Collection Time: 03/16/15  5:15 PM  Result Value Ref Range   Specimen Description BLOOD LEFT HAND    Special Requests IN PEDIATRIC BOTTLE .5CC    Culture      NO GROWTH < 24 HOURS Performed at [facility]    Report Status PENDING   Magnesium     Status: None   Collection Time: 03/16/15  8:50 PM  Result Value Ref Range   Magnesium 1.8 1.7 - 2.4 mg/dL  TSH     Status: None   Collection Time: 03/16/15  8:50 PM  Result Value Ref Range   TSH 1.245 0.350 - 4.500 uIU/mL  Troponin I     Status: None   Collection Time: 03/16/15  8:50 PM  Result Value Ref Range   Troponin I <0.03 <0.031 ng/mL    Comment:        NO INDICATION OF MYOCARDIAL INJURY.   Troponin I     Status: None   Collection Time: 03/17/15  5:15 AM  Result Value Ref Range   Troponin I 0.03 <0.031 ng/mL    Comment:        NO INDICATION OF MYOCARDIAL INJURY.   Comprehensive metabolic panel     Status: Abnormal   Collection Time: 03/17/15  5:15 AM  Result Value Ref Range   Sodium 139 135 - 145 mmol/L   Potassium 3.9 3.5 - 5.1 mmol/L   Chloride 105 101 - 111 mmol/L   CO2 27 22 - 32 mmol/L   Glucose, Bld 125 (H) 65 - 99 mg/dL   BUN 17 6 - 20 mg/dL   Creatinine, Ser 1.54 (H) 0.44 - 1.00 mg/dL   Calcium 8.2 (L) 8.9 - 10.3 mg/dL   Total Protein 6.3 (L) 6.5 - 8.1 g/dL   Albumin 2.8 (L) 3.5 - 5.0 g/dL   AST 36 15 - 41 U/L   ALT 20 14 - 54 U/L   Alkaline Phosphatase 67 38 - 126 U/L   Total Bilirubin 0.7 0.3 - 1.2 mg/dL   GFR calc non Af Amer 30 (L) >60 mL/min   GFR calc Af Amer 35 (L) >60 mL/min    Comment: (NOTE) The eGFR has been calculated using the CKD EPI equation. This calculation has not been validated in all clinical situations. eGFR's persistently  <60 mL/min signify possible Chronic Kidney Disease.    Anion gap 7 5 - 15  CBC     Status: Abnormal   Collection Time: 03/17/15  5:15 AM  Result Value Ref Range   WBC 16.3 (H) 4.0 - 10.5 K/uL   RBC 3.91 3.87 - 5.11 MIL/uL   Hemoglobin 12.9 12.0 - 15.0 g/dL   HCT 38.8 36.0 - 46.0 %   MCV 99.2 78.0 - 100.0 fL   MCH 33.0 26.0 - 34.0 pg   MCHC 33.2 30.0 - 36.0 g/dL   RDW 14.6 11.5 - 15.5 %   Platelets 124 (L) 150 - 400 K/uL  Troponin I     Status: Abnormal   Collection Time: 03/17/15 11:30 AM  Result Value Ref Range   Troponin I 0.04 (H) <0.031 ng/mL    Comment:        PERSISTENTLY INCREASED TROPONIN VALUES IN THE RANGE OF 0.04-0.49 ng/mL CAN BE SEEN IN:       -  UNSTABLE ANGINA       -CONGESTIVE HEART FAILURE       -MYOCARDITIS       -CHEST TRAUMA       -ARRYHTHMIAS       -LATE PRESENTING MYOCARDIAL INFARCTION       -COPD   CLINICAL FOLLOW-UP RECOMMENDED.    Recent Results (from the past 240 hour(s))  Urine culture     Status: None (Preliminary result)   Collection Time: 03/16/15  2:32 PM  Result Value Ref Range Status   Specimen Description URINE, CATHETERIZED  Final   Special Requests NONE  Final   Culture   Final    CULTURE REINCUBATED FOR BETTER GROWTH Performed at [facility]    Report Status PENDING  Incomplete  Blood culture (routine x 2)     Status: None (Preliminary result)   Collection Time: 03/16/15  5:15 PM  Result Value Ref Range Status   Specimen Description BLOOD LEFT HAND  Final   Special Requests IN PEDIATRIC BOTTLE .5CC  Final   Culture   Final    NO GROWTH < 24 HOURS Performed at [facility]    Report Status PENDING  Incomplete   Creatinine:  Recent Labs  03/16/15 1528 03/17/15 0515  CREATININE 1.67* 1.54*    CLINICAL DATA: Pyelonephritis  EXAM: CT ABDOMEN AND PELVIS WITHOUT CONTRAST  TECHNIQUE: Multidetector CT imaging of the abdomen and pelvis was performed following the standard protocol without IV  contrast.  COMPARISON: 01/27/2008  FINDINGS: Lower chest: Streak artifact from patient's hands overlying the upper abdomen noted. Trace left greater than right pleural fluid with associated compressive atelectasis. Lung bases are otherwise clear.  Hepatobiliary: Common duct fusiform dilation at its midportion measuring 1.2 cm maximally, with tapering to the ampulla noted. Liver is grossly unremarkable.  Pancreas: Fatty infiltrated and atrophic but grossly unremarkable otherwise.  Spleen: Normal  Adrenals/Urinary Tract: Adrenal glands appear normal. Right sided extrarenal pelvis noted but the right kidney is otherwise grossly unremarkable. 2.0 cm left ureteropelvic junction calculus image 40, with moderate left hydronephrosis. Left mid renal calculus measures 1.3 cm image 41. Trace left perinephric fluid/stranding.  Stomach/Bowel: Colonic diverticuli noted without evidence for diverticulitis. No bowel wall thickening or focal segmental dilatation is identified.  Vascular/Lymphatic: Moderate atheromatous aortic calcification without aneurysm. Small retroperitoneal nodes are identified measuring less than 1 cm in diameter.  Other: Ovaries are unremarkable. Detail in the anatomic pelvis is obscured by streak artifact from bilateral hip arthroplasty.  No free air. Left greater than right fat containing inguinal hernias.  Musculoskeletal: Multilevel severe degenerative change of the lumbar spine. No acute osseous abnormality.  IMPRESSION: 2 cm left ureteropelvic junction calculus producing moderate left hydronephrosis.      Impression/Assessment:  1. Left obstructing nephrolithiasis 2. Sepsis 2/2 above  Plan:  1.  Consult interventional radiology for stat left percutaneous nephrostomy tube 2.  Continue antibiotics pending culture and sensitivity results 3.  Monitor vitals and trend WBC, Cr 4.  Will need definitve stone management via percutaneous  nephrolithotomy in the future.  This will further be discussed with patient and family during outpatient follow up.  Horald Pollen [facility] 03/17/2015, 4:10 PM

## 2015-03-18 ENCOUNTER — Telehealth: Payer: Self-pay | Admitting: *Deleted

## 2015-03-18 ENCOUNTER — Inpatient Hospital Stay (HOSPITAL_COMMUNITY): Payer: Medicare Other

## 2015-03-18 ENCOUNTER — Telehealth: Payer: Self-pay | Admitting: Family

## 2015-03-18 HISTORY — PX: OTHER SURGICAL HISTORY: SHX169

## 2015-03-18 LAB — BASIC METABOLIC PANEL
Anion gap: 3 — ABNORMAL LOW (ref 5–15)
BUN: 24 mg/dL — ABNORMAL HIGH (ref 6–20)
CALCIUM: 7.9 mg/dL — AB (ref 8.9–10.3)
CO2: 29 mmol/L (ref 22–32)
CREATININE: 1.71 mg/dL — AB (ref 0.44–1.00)
Chloride: 105 mmol/L (ref 101–111)
GFR calc non Af Amer: 27 mL/min — ABNORMAL LOW (ref >60)
GFR, EST AFRICAN AMERICAN: 31 mL/min — AB (ref >60)
Glucose, Bld: 101 mg/dL — ABNORMAL HIGH (ref 65–99)
Potassium: 3.7 mmol/L (ref 3.5–5.1)
SODIUM: 137 mmol/L (ref 135–145)

## 2015-03-18 LAB — URINE CULTURE

## 2015-03-18 LAB — CBC
HCT: 36 % (ref 36.0–46.0)
Hemoglobin: 12.1 g/dL (ref 12.0–15.0)
MCH: 33.1 pg (ref 26.0–34.0)
MCHC: 33.6 g/dL (ref 30.0–36.0)
MCV: 98.4 fL (ref 78.0–100.0)
PLATELETS: 99 10*3/uL — AB (ref 150–400)
RBC: 3.66 MIL/uL — AB (ref 3.87–5.11)
RDW: 14.6 % (ref 11.5–15.5)
WBC: 8.3 10*3/uL (ref 4.0–10.5)

## 2015-03-18 LAB — APTT: APTT: 43 s — AB (ref 24–37)

## 2015-03-18 LAB — PROTIME-INR
INR: 1.4 (ref 0.00–1.49)
PROTHROMBIN TIME: 17.3 s — AB (ref 11.6–15.2)

## 2015-03-18 MED ORDER — MIDAZOLAM HCL 2 MG/2ML IJ SOLN
INTRAMUSCULAR | Status: AC
  Filled 2015-03-18: qty 2

## 2015-03-18 MED ORDER — LIDOCAINE HCL 1 % IJ SOLN
INTRAMUSCULAR | Status: AC
  Filled 2015-03-18: qty 20

## 2015-03-18 MED ORDER — IOHEXOL 300 MG/ML  SOLN
5.0000 mL | Freq: Once | INTRAMUSCULAR | Status: DC | PRN
  Administered 2015-03-18: 5 mL
  Filled 2015-03-18: qty 10

## 2015-03-18 MED ORDER — FENTANYL CITRATE (PF) 100 MCG/2ML IJ SOLN
INTRAMUSCULAR | Status: AC
  Filled 2015-03-18: qty 2

## 2015-03-18 MED ORDER — MIDAZOLAM HCL 2 MG/2ML IJ SOLN
INTRAMUSCULAR | Status: AC | PRN
  Administered 2015-03-18: 0.5 mg via INTRAVENOUS

## 2015-03-18 MED ORDER — FENTANYL CITRATE (PF) 100 MCG/2ML IJ SOLN
INTRAMUSCULAR | Status: AC | PRN
  Administered 2015-03-18: 25 ug via INTRAVENOUS

## 2015-03-18 NOTE — Evaluation (Signed)
Physical Therapy Evaluation Patient Details Name: BURNA ATLAS MRN: [id number] DOB: 06-Sep-1930 Today's Date: 03/18/2015   History of Present Illness  81 y.o. female with past medical history significant for obesity, depression, anxiety, Alzheimer's dementia, GERD, sacral decubitus ulcer, diverticular disease, recurrent UTIs was admitted to the hospital on 8/31 with diminished oral intake, lethargy/diminished activity level, and flank pain.  Pt with presumed left pyelonephritis and 2 cm left ureteropelvic junction calculus/moderate hydronephrosis and s/p left percutaneous nephrostomy 9/2 by IR.   Clinical Impression  Pt admitted with above diagnosis. Pt currently with functional limitations due to the deficits listed below (see PT Problem List).  Pt will benefit from skilled PT to increase their independence and safety with mobility to allow discharge to the venue listed below.   Family present and report pt not at cognitive nor mobility baseline at this time and would like SNF prior to pt returning home with spouse.  Pt currently requiring assist for mobility.     Follow Up Recommendations SNF;Supervision/Assistance - 24 hour    Equipment Recommendations  None recommended by PT    Recommendations for Other Services       Precautions / Restrictions Precautions Precautions: Fall Precaution Comments: L PCN drain      Mobility  Bed Mobility Overal bed mobility: Needs Assistance;+2 for physical assistance Bed Mobility: Supine to Sit;Sit to Supine     Supine to sit: Mod assist;HOB elevated Sit to supine: Mod assist   General bed mobility comments: multimodal cues for technique, assist for scooting to EOB and trunk upright, assist for LEs onto bed  Transfers Overall transfer level: Needs assistance Equipment used: 4-wheeled walker Transfers: Sit to/from Stand Sit to Stand: Mod assist;+2 physical assistance         General transfer comment: verbal cues for safe technique,  assist to rise and steady  Ambulation/Gait Ambulation/Gait assistance: Min assist;+2 safety/equipment Ambulation Distance (Feet): 28 Feet Assistive device: 4-wheeled walker Gait Pattern/deviations: Step-through pattern;Decreased stride length Gait velocity: decreased   General Gait Details: distance limited by pt (likely cognition), fatigues quickly, assist for steadying  Stairs            Wheelchair Mobility    Modified Rankin (Stroke Patients Only)       Balance                                             Pertinent Vitals/Pain Pain Assessment: No/denies pain    Home Living Family/patient expects to be discharged to:: Skilled nursing facility                 Additional Comments: from home with elderly spouse    Prior Function Level of Independence: Needs assistance   Gait / Transfers Assistance Needed: uses rollator  ADL's / Homemaking Assistance Needed: spouse and dtr assist with laundry, meals, bathing, dressing        Hand Dominance        Extremity/Trunk Assessment   Upper Extremity Assessment: Generalized weakness           Lower Extremity Assessment: Generalized weakness         Communication   Communication: HOH  Cognition Arousal/Alertness:  (tired/groggy ) Behavior During Therapy: Flat affect Overall Cognitive Status: Impaired/Different from baseline Area of Impairment: Orientation;Following commands;Safety/judgement Orientation Level: Disoriented to;Situation;Time;Place   Memory: Decreased recall of precautions;Decreased short-term memory Following Commands: Follows  one step commands inconsistently Safety/Judgement: Decreased awareness of safety;Decreased awareness of deficits     General Comments: dementia per family (hx Alzheimers), family reports not at baseline cognition    General Comments      Exercises        Assessment/Plan    PT Assessment Patient needs continued PT services  PT  Diagnosis Difficulty walking;Generalized weakness   PT Problem List Decreased strength;Decreased activity tolerance;Decreased mobility;Decreased cognition;Decreased safety awareness;Decreased knowledge of use of DME  PT Treatment Interventions DME instruction;Gait training;Functional mobility training;Patient/family education;Therapeutic activities;Therapeutic exercise;Balance training   PT Goals (Current goals can be found in the Care Plan section) Acute Rehab PT Goals Patient Stated Goal: family would like rehab to assist pt back to her baseline prior to home PT Goal Formulation: With patient/family Time For Goal Achievement: 04/01/15 Potential to Achieve Goals: Good    Frequency Min 3X/week   Barriers to discharge        Co-evaluation               End of Session Equipment Utilized During Treatment: Gait belt Activity Tolerance: Patient limited by fatigue Patient left: in bed;with call bell/phone within reach;with family/visitor present           Time: 4128-7867 PT Time Calculation (min) (ACUTE ONLY): 18 min   Charges:   PT Evaluation $Initial PT Evaluation Tier I: 1 Procedure     PT G Codes:        Zyion Doxtater,KATHrine E 03/18/2015, 2:26 PM Carmelia Bake, PT, DPT 03/18/2015 Pager: [phone]

## 2015-03-18 NOTE — Progress Notes (Signed)
[facility] PROGRESS NOTE  [name] HWT:[id number] DOB: [birth year] DOA: 03/16/2015 PCP: Nance Pear., NP  Assessment/Plan: Acute Encephalopathy/Alzheimer's Dementia -2/2 UTI on top of baseline dementia. -Per family members, she is still worse than baseline. -Per neurology notes, has severe dementia.  UTI/Pyelonephritis -Continue rocephin pending cx data. -She now s/p a perc nephrostomy tube by IR. -Will follow up with urology in a few weeks for definitive stone management.  ARF -Secondary to dehydration. -Suspect obstructing stone may be playing a role as well as likely has some degree of chronicity.  Code Status: Full Code Family Communication: Discussed with daughter at bedside  Disposition Plan: To be determined. Will request PT evaluation as she will likely need SNF upon DC.   Consultants:  Dr. Pilar Jarvis, urology   Antibiotics:  Rocephin   Subjective: In bed, drowsy today after PCN placement.  Objective: Filed Vitals:   03/18/15 0920 03/18/15 0950 03/18/15 1023 03/18/15 1100  BP: 149/47 141/57 154/67 144/93  Pulse: 66 67 64 109  Temp: 97.8 F (36.6 C) 98.5 F (36.9 C) 98.1 F (36.7 C) 98.1 F (36.7 C)  TempSrc: Oral Oral Oral Oral  Resp: 18 18 18 17   Height:      Weight:      SpO2: 100% 100% 100% 94%    Intake/Output Summary (Last 24 hours) at 03/18/15 1404 Last data filed at 03/18/15 1243  Gross per 24 hour  Intake 1726.25 ml  Output    200 ml  Net 1526.25 ml   Filed Weights   03/16/15 1913  Weight: 72.893 kg (160 lb 11.2 oz)    Exam:   General:  Drowsy,   Cardiovascular: RRR, strong systolic murmur  Respiratory: CTA B  Abdomen: obese/S/NT/ND/+BS  Extremities: trace bilateral edema   Neurologic:  Unable to fully assess given dementia  Data Reviewed: Basic Metabolic Panel:  Recent Labs Lab 03/16/15 1528 03/16/15 2050 03/17/15 0515 03/18/15 0450  NA 139  --  139 137  K 4.3  --  3.9 3.7  CL 101  --   105 105  CO2 30  --  27 29  GLUCOSE 149*  --  125* 101*  BUN 19  --  17 24*  CREATININE 1.67*  --  1.54* 1.71*  CALCIUM 9.1  --  8.2* 7.9*  MG  --  1.8  --   --    Liver Function Tests:  Recent Labs Lab 03/16/15 1528 03/17/15 0515  AST 32 36  ALT 13* 20  ALKPHOS 76 67  BILITOT 0.9 0.7  PROT 7.9 6.3*  ALBUMIN 3.4* 2.8*   No results for input(s): LIPASE, AMYLASE in the last 168 hours.  Recent Labs Lab 03/16/15 1529  AMMONIA 21   CBC:  Recent Labs Lab 03/16/15 1528 03/17/15 0515 03/18/15 0450  WBC 15.4* 16.3* 8.3  NEUTROABS 12.4*  --   --   HGB 15.1* 12.9 12.1  HCT 45.0 38.8 36.0  MCV 99.8 99.2 98.4  PLT 150 124* 99*   Cardiac Enzymes:  Recent Labs Lab 03/16/15 2050 03/17/15 0515 03/17/15 1130  TROPONINI <0.03 0.03 0.04*   BNP (last 3 results) No results for input(s): BNP in the last 8760 hours.  ProBNP (last 3 results)  Recent Labs  07/02/14 1838  PROBNP 470.0*    CBG:  Recent Labs Lab 03/16/15 1532  GLUCAP 131*    Recent Results (from the past 240 hour(s))  Urine culture     Status: None   Collection Time:  03/16/15  2:32 PM  Result Value Ref Range Status   Specimen Description URINE, CATHETERIZED  Final   Special Requests NONE  Final   Culture   Final    20,000 COLONIES/mL YEAST Performed at [facility]    Report Status 03/18/2015 FINAL  Final  Blood culture (routine x 2)     Status: None (Preliminary result)   Collection Time: 03/16/15  5:15 PM  Result Value Ref Range Status   Specimen Description BLOOD LEFT HAND  Final   Special Requests IN PEDIATRIC BOTTLE .5CC  Final   Culture   Final    NO GROWTH < 24 HOURS Performed at [facility]    Report Status PENDING  Incomplete  Urine culture     Status: None (Preliminary result)   Collection Time: 03/18/15  9:02 AM  Result Value Ref Range Status   Specimen Description URINE, CATHETERIZED  Final   Special Requests NEPHROSTOMY Performed at [facility]    Final   Culture PENDING  Incomplete   Report Status PENDING  Incomplete     Studies: Ct Abdomen Pelvis Wo Contrast  03/16/2015   CLINICAL DATA:  Pyelonephritis  EXAM: CT ABDOMEN AND PELVIS WITHOUT CONTRAST  TECHNIQUE: Multidetector CT imaging of the abdomen and pelvis was performed following the standard protocol without IV contrast.  COMPARISON:  01/27/2008  FINDINGS: Lower chest: Streak artifact from patient's hands overlying the upper abdomen noted. Trace left greater than right pleural fluid with associated compressive atelectasis. Lung bases are otherwise clear.  Hepatobiliary: Common duct fusiform dilation at its midportion measuring 1.2 cm maximally, with tapering to the ampulla noted. Liver is grossly unremarkable.  Pancreas: Fatty infiltrated and atrophic but grossly unremarkable otherwise.  Spleen: Normal  Adrenals/Urinary Tract: Adrenal glands appear normal. Right sided extrarenal pelvis noted but the right kidney is otherwise grossly unremarkable. 2.0 cm left ureteropelvic junction calculus image 40, with moderate left hydronephrosis. Left mid renal calculus measures 1.3 cm image 41. Trace left perinephric fluid/stranding.  Stomach/Bowel: Colonic diverticuli noted without evidence for diverticulitis. No bowel wall thickening or focal segmental dilatation is identified.  Vascular/Lymphatic: Moderate atheromatous aortic calcification without aneurysm. Small retroperitoneal nodes are identified measuring less than 1 cm in diameter.  Other: Ovaries are unremarkable. Detail in the anatomic pelvis is obscured by streak artifact from bilateral hip arthroplasty.  No free air. Left greater than right fat containing inguinal hernias.  Musculoskeletal: Multilevel severe degenerative change of the lumbar spine. No acute osseous abnormality.  IMPRESSION: 2 cm left ureteropelvic junction calculus producing moderate left hydronephrosis.   Electronically Signed   By: Conchita Paris M.D.   On: 03/16/2015 21:48    Dg Chest 2 View  03/16/2015   CLINICAL DATA:  Decreased appetite and mental status changes.  EXAM: CHEST  2 VIEW  COMPARISON:  03/02/2015  FINDINGS: The cardiac silhouette, mediastinal and hilar contours are within normal limits and stable. There is moderate tortuosity, ectasia and calcification of the thoracic aorta. Low lung volumes with vascular crowding and bibasilar atelectasis. No definite infiltrates or effusions. The bony thorax is intact. Stable degenerative changes involving both shoulders.  IMPRESSION: Low lung volumes with vascular crowding and bibasilar atelectasis. No definite infiltrates or effusions.   Electronically Signed   By: Marijo Sanes M.D.   On: 03/16/2015 16:03   Ct Head Wo Contrast  03/16/2015   CLINICAL DATA:  Weakness and decline in appetite, abnormal behavior. Dementia, altered mental status.  EXAM: CT HEAD WITHOUT CONTRAST  TECHNIQUE: Contiguous axial images were obtained from the base of the skull through the vertex without intravenous contrast.  COMPARISON:  01/02/2011.  FINDINGS: No evidence of an acute infarct, acute hemorrhage, mass lesion, mass effect or hydrocephalus. Atrophy. Periventricular low attenuation. Visualized portions of the paranasal sinuses and mastoid air cells are clear.  IMPRESSION: 1. No acute intracranial abnormality. 2. Atrophy and chronic microvascular white matter ischemic changes.   Electronically Signed   By: Lorin Picket M.D.   On: 03/16/2015 16:12   Ir Nephrostomy Placement Left  03/18/2015   CLINICAL DATA:  Left hydronephrosis, obstructing left UPJ calculus.  EXAM: ULTRASOUND GUIDANCE FOR NEPHROSTOMY ACCESS  TEN FRENCH LEFT NEPHROSTOMY UNDER FLUOROSCOPY  Date:  9/2/20169/08/2014 9:23 am  Radiologist:  M. Daryll Brod, MD  Guidance:  Ultrasound and fluoroscopic  FLUOROSCOPY TIME:  1 minutes 24 seconds, 19.9 mGy  MEDICATIONS AND MEDICAL HISTORY: 0.5 mg Versed, 25 mcg fentanyl, patient is already receiving IV Rocephin.  ANESTHESIA/SEDATION: 8  minutes  CONTRAST:  45mL OMNIPAQUE IOHEXOL 300 MG/ML  SOLN  COMPLICATIONS: None immediate  PROCEDURE: Informed consent was obtained from the patient following explanation of the procedure, risks, benefits and alternatives. The patient understands, agrees and consents for the procedure. All questions were addressed. A time out was performed.  Maximal barrier sterile technique utilized including caps, mask, sterile gowns, sterile gloves, large sterile drape, hand hygiene, and Betadine.  Previous imaging reviewed. Patient positioned prone. Preliminary ultrasound demonstrated hydronephrosis of the left kidney. Under sterile conditions and local anesthesia, a dilated lower pole calyx was accessed percutaneously. This was performed with ultrasound. Images obtained for documentation. There was return of cloudy urine. Sample sent for Gram stain and culture. Guidewire advanced followed by the Accustick dilator set. Amplatz guidewire exchange performed followed by tract dilatation to insert a 10 [place] drain. Retention loop formed in the collecting system. Contrast injection confirms position. Catheter secured with a Prolene suture and connected to external gravity drainage. No immediate complication. Patient tolerated the procedure well.  IMPRESSION: Successful ultrasound and fluoroscopic left 10 French percutaneous nephrostomy.   Electronically Signed   By: Jerilynn Mages.  Shick M.D.   On: 03/18/2015 11:31    Scheduled Meds: . aspirin  325 mg Oral Daily  . cefTRIAXone (ROCEPHIN)  IV  1 g Intravenous Q24H  . citalopram  20 mg Oral Daily  . donepezil  10 mg Oral Daily  . feeding supplement (ENSURE ENLIVE)  237 mL Oral BID BM  . heparin subcutaneous  5,000 Units Subcutaneous 3 times per day  . memantine  28 mg Oral Daily  . oxybutynin  5 mg Oral TID  . pantoprazole  40 mg Oral QAC breakfast  . sodium chloride  3 mL Intravenous Q12H   Continuous Infusions: . sodium chloride 75 mL/hr at 03/18/15 0007    Principal  Problem:   Acute encephalopathy Active Problems:   UTI (lower urinary tract infection)   Hydronephrosis, left   Left nephrolithiasis   Hydronephrosis of left kidney   Pyelonephritis    Time spent: 25 minutes. Greater than 50% of this time was spent in direct contact with the patient coordinating care.    Lelon Frohlich  [facility] Pager [phone]  If 7PM-7AM, please contact night-coverage at [email or web address], password [facility] 03/18/2015, 2:04 PM  LOS: 2 days

## 2015-03-18 NOTE — Clinical Social Work Note (Signed)
Clinical Social Work Assessment  Patient Details  Name: [name] MRN: [id number] Date of Birth: 05/21/1931  Date of referral:  03/18/15               Reason for consult:  Facility Placement                Permission sought to share information with:  Chartered certified accountant granted to share information::  Yes, Verbal Permission Granted  Name::        Agency::     Relationship::     Contact Information:     Housing/Transportation Living arrangements for the past 2 months:  Single Family Home Source of Information:  Adult Children Patient Interpreter Needed:  None Criminal Activity/Legal Involvement Pertinent to Current Situation/Hospitalization:  No - Comment as needed Significant Relationships:  Adult Children, Spouse Lives with:  Spouse Do you feel safe going back to the place where you live?  No Need for family participation in patient care:  Yes (Comment)  Care giving concerns:  CSW reviewed PT evaluation recommending SNF at discharge.    Social Worker assessment / plan:  CSW spoke with patient's daughters at bedside re: discharge plan - they are in agreement with plan for SNF.   Employment status:  Retired Nurse, adult PT Recommendations:  [place] / Referral to community resources:  [place]  Patient/Family's Response to care:  Patient was living at home with her 79 year old husband who was caring for her, though patient will need to go to SNF to regain strength before returning home.   Patient/Family's Understanding of and Emotional Response to Diagnosis, Current Treatment, and Prognosis:  Patient's daughters were concerned about when patient would get kidney stone removal.   Emotional Assessment Appearance:  Appears stated age Attitude/Demeanor/Rapport:    Affect (typically observed):  Calm, Quiet Orientation:  Oriented to Self, Oriented to Place, Oriented to   Time Alcohol / Substance use:    Psych involvement (Current and /or in the community):     Discharge Needs  Concerns to be addressed:    Readmission within the last 30 days:    Current discharge risk:    Barriers to Discharge:      Standley Brooking, LCSW 03/18/2015, 2:33 PM

## 2015-03-18 NOTE — Care Management Important Message (Signed)
Important Message  Patient Details  Name: [name] MRN: [id number] Date of Birth: 1930/08/31   Medicare Important Message Given:  [facility] notification given    Camillo Flaming 03/18/2015, 11:28 [place] Message  Patient Details  Name: [name] MRN: [id number] Date of Birth: 1931/01/08   Medicare Important Message Given:  Yes-second notification given    Camillo Flaming 03/18/2015, 11:28 AM

## 2015-03-18 NOTE — Clinical Social Work Placement (Signed)
Patient's daughter has accepted bed offer at [facility] when stable. Blue Medicare authorization obtained (auth#: [id number]). Anticipating discharge over weekend.     Raynaldo Opitz, [facility] Clinical Social Worker cell #: [phone]    CLINICAL SOCIAL WORK PLACEMENT  NOTE  Date:  03/18/2015  Patient Details  Name: [name] MRN: [id number] Date of Birth: 1930-11-25  Clinical Social Work is seeking post-discharge placement for this patient at the [place] level of care (*CSW will initial, date and re-position this form in  chart as items are completed):  Yes   Patient/family provided with [address] Work Department's list of facilities offering this level of care within the geographic area requested by the patient (or if unable, by the patient's family).  Yes   Patient/family informed of their freedom to choose among providers that offer the needed level of care, that participate in Medicare, Medicaid or managed care program needed by the patient, have an available bed and are willing to accept the patient.  Yes   Patient/family informed of [facility] ownership interest in [facility] and [facility], as well as of the fact that they are under no obligation to receive care at these facilities.  PASRR submitted to EDS on 03/18/15     PASRR number received on 03/18/15     Existing PASRR number confirmed on       FL2 transmitted to all facilities in geographic area requested by pt/family on 03/18/15     FL2 transmitted to all facilities within larger geographic area on       Patient informed that his/her managed care company has contracts with or will negotiate with certain facilities, including the following:        Yes   Patient/family informed of bed offers received.  Patient chooses bed at [facility]     Physician recommends and patient chooses bed at      Patient to be transferred to  [facility] on  .  Patient to be transferred to facility by       Patient family notified on   of transfer.  Name of family member notified:        PHYSICIAN       Additional Comment:    _______________________________________________ Standley Brooking, LCSW 03/18/2015, 2:36 PM

## 2015-03-18 NOTE — Procedures (Signed)
Successful LT PCN INSERTION EXUDATIVE URINE ASPIRATED GS/CX SENT NO COMP STABLE KEEP TO [place] DRAIN FULL REPORT IN PACS

## 2015-03-18 NOTE — Telephone Encounter (Signed)
Caller name: Daryll Drown Relationship to patient: daughter Can be [phone]  Reason for call: Pt is in the hospital due to complications with kidney stones. She is at [employer]. [name] is wondering if she can get FMLA for intermittent leave to take care of pt. She is requesting call from [facility] to discuss concerns with pt.

## 2015-03-18 NOTE — Discharge Instructions (Signed)
Percutaneous Nephrostomy Percutaneous nephrostomy is the insertion of a flexible tube into your kidney through your back. This is done to provide access to an obstructed kidney. The goal of this procedure is to allow the urine that is produced in the kidney to drain, which will relieve pressure or infection from damaging your kidney. This will allow your health care provider to identify the cause of the obstruction and plan appropriate treatment. LET [facility] CARE PROVIDER KNOW ABOUT:  Any allergies you have.  All medicines you are taking, including vitamins, herbs, eye drops, creams, and over-the-counter medicines.  Previous problems you or members of your family have had with the use of anesthetics.  Any blood disorders you have.  Previous surgeries you have had.  Medical conditions you have.  Possibility of pregnancy, if this applies. RISKS AND COMPLICATIONS Generally, this is a safe procedure. However, as with any procedure, problems can occur. Possible problems include:  Infection.  Damage to the organs surrounding your kidney. BEFORE THE PROCEDURE Your health care provider may want you to have blood tests. These tests can help tell how well your kidneys and liver are working. They can also show how well your blood clots. If you take anticoagulant medicine, sometimes called blood thinners, ask your health care provider when you should stop taking them. Make arrangements for someone to take you home after the procedure, if needed. PROCEDURE The procedure is performed as follows:  An intravenous IV catheter will be inserted into one of the veins in your arm. Medicine will be able to flow directly into your body through this catheter. You may be given medicines through this tube to help prevent nausea and pain, and antibiotics to help prevent infection.   You will be placed on your stomach and given medicine that numbs the site (local anesthetic) where the percutaneous  nephrostomy tube will be inserted.  You will be given a medicine that makes you go to sleep (general anesthetic).  The percutaneous nephrostomy tube, which is thin and flexible, will be inserted into a needle.  The needle will be inserted into your body and guided to your kidney with the help of an imaging method that uses X-ray images (fluoroscopy).  A dye will be injected through the nephrostomy tube. Then, X-ray images that highlight your kidney will be taken.  The needle is then removed, but the nephrostomy tube will be left in your kidney. The tube will drain urine from your kidney to a collection bag outside your body. The tube is usually secured to your skin with stitches (sutures). AFTER THE PROCEDURE   You will stay in a recovery area until the sedation has worn off. Your blood pressure and pulse will be checked.  You will need to remain lying down for several hours. Document Released: 04/22/2013 Document Revised: 11/16/2013 Document Reviewed: 04/22/2013 [facility] Patient Information 2015 [place], [place]. This information is not intended to replace advice given to you by your health care provider. Make sure you discuss any questions you have with your health care provider.

## 2015-03-18 NOTE — Telephone Encounter (Signed)
Yes, I will fill out

## 2015-03-18 NOTE — Progress Notes (Signed)
Subjective: The patient received left PCN today without difficulty.  Remains drowsy.  Not at baseline per family  Objective: Vital signs in last 24 hours: Temp:  [97.7 F (36.5 C)-102.3 F (39.1 C)] 98.1 F (36.7 C) (09/02 1100) Pulse Rate:  [58-109] 109 (09/02 1100) Resp:  [17-20] 17 (09/02 1100) BP: (103-173)/(47-93) 144/93 mmHg (09/02 1100) SpO2:  [92 %-100 %] 94 % (09/02 1100)  Intake/Output from previous day: 09/01 0701 - 09/02 0700 In: 1841.3 [P.O.:120; I.V.:1671.3; IV Piggyback:50] Out: -  Intake/Output this shift: Total I/O In: 5 [Other:5] Out: 200 [Urine:200]  Physical Exam:  General:fatigued EG:BTDV  NT ND. L PCN draining clear, blood tinged urine No foley  Lab Results:  Recent Labs  03/16/15 1528 03/17/15 0515 03/18/15 0450  HGB 15.1* 12.9 12.1  HCT 45.0 38.8 36.0   BMET  Recent Labs  03/17/15 0515 03/18/15 0450  NA 139 137  K 3.9 3.7  CL 105 105  CO2 27 29  GLUCOSE 125* 101*  BUN 17 24*  CREATININE 1.54* 1.71*  CALCIUM 8.2* 7.9*    Recent Labs  03/17/15 1620 03/18/15 0450  INR 1.34 1.40   No results for input(s): LABURIN in the last 72 hours. Results for orders placed or performed during the hospital encounter of 03/16/15  Urine culture     Status: None   Collection Time: 03/16/15  2:32 PM  Result Value Ref Range Status   Specimen Description URINE, CATHETERIZED  Final   Special Requests NONE  Final   Culture   Final    20,000 COLONIES/mL YEAST Performed at [facility]    Report Status 03/18/2015 FINAL  Final  Blood culture (routine x 2)     Status: None (Preliminary result)   Collection Time: 03/16/15  5:15 PM  Result Value Ref Range Status   Specimen Description BLOOD LEFT HAND  Final   Special Requests IN PEDIATRIC BOTTLE .5CC  Final   Culture   Final    NO GROWTH < 24 HOURS Performed at [facility]    Report Status PENDING  Incomplete  Urine culture     Status: None (Preliminary result)   Collection  Time: 03/18/15  9:02 AM  Result Value Ref Range Status   Specimen Description URINE, CATHETERIZED  Final   Special Requests NEPHROSTOMY Performed at [facility]   Final   Culture PENDING  Incomplete   Report Status PENDING  Incomplete    Studies/Results: Ct Abdomen Pelvis Wo Contrast  03/16/2015   CLINICAL DATA:  Pyelonephritis  EXAM: CT ABDOMEN AND PELVIS WITHOUT CONTRAST  TECHNIQUE: Multidetector CT imaging of the abdomen and pelvis was performed following the standard protocol without IV contrast.  COMPARISON:  01/27/2008  FINDINGS: Lower chest: Streak artifact from patient's hands overlying the upper abdomen noted. Trace left greater than right pleural fluid with associated compressive atelectasis. Lung bases are otherwise clear.  Hepatobiliary: Common duct fusiform dilation at its midportion measuring 1.2 cm maximally, with tapering to the ampulla noted. Liver is grossly unremarkable.  Pancreas: Fatty infiltrated and atrophic but grossly unremarkable otherwise.  Spleen: Normal  Adrenals/Urinary Tract: Adrenal glands appear normal. Right sided extrarenal pelvis noted but the right kidney is otherwise grossly unremarkable. 2.0 cm left ureteropelvic junction calculus image 40, with moderate left hydronephrosis. Left mid renal calculus measures 1.3 cm image 41. Trace left perinephric fluid/stranding.  Stomach/Bowel: Colonic diverticuli noted without evidence for diverticulitis. No bowel wall thickening or focal segmental dilatation is identified.  Vascular/Lymphatic: Moderate  atheromatous aortic calcification without aneurysm. Small retroperitoneal nodes are identified measuring less than 1 cm in diameter.  Other: Ovaries are unremarkable. Detail in the anatomic pelvis is obscured by streak artifact from bilateral hip arthroplasty.  No free air. Left greater than right fat containing inguinal hernias.  Musculoskeletal: Multilevel severe degenerative change of the lumbar spine. No acute osseous  abnormality.  IMPRESSION: 2 cm left ureteropelvic junction calculus producing moderate left hydronephrosis.   Electronically Signed   By: Conchita Paris M.D.   On: 03/16/2015 21:48   Dg Chest 2 View  03/16/2015   CLINICAL DATA:  Decreased appetite and mental status changes.  EXAM: CHEST  2 VIEW  COMPARISON:  03/02/2015  FINDINGS: The cardiac silhouette, mediastinal and hilar contours are within normal limits and stable. There is moderate tortuosity, ectasia and calcification of the thoracic aorta. Low lung volumes with vascular crowding and bibasilar atelectasis. No definite infiltrates or effusions. The bony thorax is intact. Stable degenerative changes involving both shoulders.  IMPRESSION: Low lung volumes with vascular crowding and bibasilar atelectasis. No definite infiltrates or effusions.   Electronically Signed   By: Marijo Sanes M.D.   On: 03/16/2015 16:03   Ct Head Wo Contrast  03/16/2015   CLINICAL DATA:  Weakness and decline in appetite, abnormal behavior. Dementia, altered mental status.  EXAM: CT HEAD WITHOUT CONTRAST  TECHNIQUE: Contiguous axial images were obtained from the base of the skull through the vertex without intravenous contrast.  COMPARISON:  01/02/2011.  FINDINGS: No evidence of an acute infarct, acute hemorrhage, mass lesion, mass effect or hydrocephalus. Atrophy. Periventricular low attenuation. Visualized portions of the paranasal sinuses and mastoid air cells are clear.  IMPRESSION: 1. No acute intracranial abnormality. 2. Atrophy and chronic microvascular white matter ischemic changes.   Electronically Signed   By: Lorin Picket M.D.   On: 03/16/2015 16:12   Ir Nephrostomy Placement Left  03/18/2015   CLINICAL DATA:  Left hydronephrosis, obstructing left UPJ calculus.  EXAM: ULTRASOUND GUIDANCE FOR NEPHROSTOMY ACCESS  TEN FRENCH LEFT NEPHROSTOMY UNDER FLUOROSCOPY  Date:  9/2/20169/08/2014 9:23 am  Radiologist:  M. Daryll Brod, MD  Guidance:  Ultrasound and fluoroscopic   FLUOROSCOPY TIME:  1 minutes 24 seconds, 19.9 mGy  MEDICATIONS AND MEDICAL HISTORY: 0.5 mg Versed, 25 mcg fentanyl, patient is already receiving IV Rocephin.  ANESTHESIA/SEDATION: 8 minutes  CONTRAST:  63mL OMNIPAQUE IOHEXOL 300 MG/ML  SOLN  COMPLICATIONS: None immediate  PROCEDURE: Informed consent was obtained from the patient following explanation of the procedure, risks, benefits and alternatives. The patient understands, agrees and consents for the procedure. All questions were addressed. A time out was performed.  Maximal barrier sterile technique utilized including caps, mask, sterile gowns, sterile gloves, large sterile drape, hand hygiene, and Betadine.  Previous imaging reviewed. Patient positioned prone. Preliminary ultrasound demonstrated hydronephrosis of the left kidney. Under sterile conditions and local anesthesia, a dilated lower pole calyx was accessed percutaneously. This was performed with ultrasound. Images obtained for documentation. There was return of cloudy urine. Sample sent for Gram stain and culture. Guidewire advanced followed by the Accustick dilator set. Amplatz guidewire exchange performed followed by tract dilatation to insert a 10 [place] drain. Retention loop formed in the collecting system. Contrast injection confirms position. Catheter secured with a Prolene suture and connected to external gravity drainage. No immediate complication. Patient tolerated the procedure well.  IMPRESSION: Successful ultrasound and fluoroscopic left 10 French percutaneous nephrostomy.   Electronically Signed   By: Jerilynn Mages.  Shick M.D.  On: 03/18/2015 11:31    Assessment/Plan: 81 y.o. Female POD 0 Left PCN placement by IR -continue L PCN -abx pending c/s results -will plan for follow up as outpatient to discuss PCNL.   -patient should be discharged with left PCN in place   LOS: 2 days   Nickie Retort 03/18/2015, 12:53 PM

## 2015-03-18 NOTE — Telephone Encounter (Signed)
Spoke with [employer]. She states pt is currently in the hospital due to obstructive kidney stone and resulting infection. They are going to try and transfer pt to a rehab facility until the infection is cleared then they will schedule surgery to remove the stone. [name] is asking if PCP will complete FMLA papers for her as she transports pt to appts, etc. . . Marland KitchenAlso wants form to cover recent days missed for pt's hospitalization (8/31, 9/1 & 9/2). Would like to include appts for pending urology and surgery.  Pt will drop forms off at the office. Please advise if you will not be able to fill them out?

## 2015-03-18 NOTE — Telephone Encounter (Signed)
Home health certification and plan of care received via fax from [facility] for certification period: 03/10/15 - 05/08/15. Forwarded to [employer]. JG//CMA

## 2015-03-19 DIAGNOSIS — I679 Cerebrovascular disease, unspecified: Secondary | ICD-10-CM

## 2015-03-19 DIAGNOSIS — F09 Unspecified mental disorder due to known physiological condition: Secondary | ICD-10-CM

## 2015-03-19 DIAGNOSIS — D696 Thrombocytopenia, unspecified: Secondary | ICD-10-CM | POA: Diagnosis not present

## 2015-03-19 DIAGNOSIS — N179 Acute kidney failure, unspecified: Secondary | ICD-10-CM | POA: Diagnosis present

## 2015-03-19 DIAGNOSIS — G309 Alzheimer's disease, unspecified: Secondary | ICD-10-CM

## 2015-03-19 LAB — BASIC METABOLIC PANEL
Anion gap: 6 (ref 5–15)
BUN: 19 mg/dL (ref 6–20)
CALCIUM: 7.8 mg/dL — AB (ref 8.9–10.3)
CO2: 22 mmol/L (ref 22–32)
CREATININE: 1.09 mg/dL — AB (ref 0.44–1.00)
Chloride: 111 mmol/L (ref 101–111)
GFR calc non Af Amer: 46 mL/min — ABNORMAL LOW (ref >60)
GFR, EST AFRICAN AMERICAN: 53 mL/min — AB (ref >60)
GLUCOSE: 79 mg/dL (ref 65–99)
Potassium: 5 mmol/L (ref 3.5–5.1)
Sodium: 139 mmol/L (ref 135–145)

## 2015-03-19 LAB — URINE CULTURE: Culture: >=100000

## 2015-03-19 MED ORDER — FLUCONAZOLE IN SODIUM CHLORIDE 400-0.9 MG/200ML-% IV SOLN
400.0000 mg | Freq: Once | INTRAVENOUS | Status: AC
  Administered 2015-03-19: 400 mg via INTRAVENOUS
  Filled 2015-03-19: qty 200

## 2015-03-19 MED ORDER — POLYETHYLENE GLYCOL 3350 17 G PO PACK
17.0000 g | PACK | Freq: Every day | ORAL | Status: DC
  Administered 2015-03-19 – 2015-03-22 (×4): 17 g via ORAL
  Filled 2015-03-19 (×4): qty 1

## 2015-03-19 MED ORDER — FLUCONAZOLE IN SODIUM CHLORIDE 200-0.9 MG/100ML-% IV SOLN
200.0000 mg | INTRAVENOUS | Status: DC
  Administered 2015-03-20: 200 mg via INTRAVENOUS
  Filled 2015-03-19: qty 100

## 2015-03-19 NOTE — Progress Notes (Signed)
Temp up and down thru night Spoke with daughter Send home when afebrile and after urine c/s obtained See urology as outpt

## 2015-03-19 NOTE — Progress Notes (Signed)
Last night at 0100, patient's temp was 101.1, Tylenol was given and at 0238, patient's temp was down to 99. Will continue to monitor.

## 2015-03-19 NOTE — Progress Notes (Signed)
Patient ID: [name], female   DOB: Aug 10, 1930, 81 y.o.   MRN: [id number]    Referring Physician(s): Pilar Jarvis  Chief Complaint:  Left pyelonephritis/hydronephrosis  Subjective:  Pt doing ok, eating lunch; more talkative today; has some soreness at left arm IV site, minimal left flank discomfort; no nausea/vomiting  Allergies: Penicillins  Medications: Prior to Admission medications   Medication Sig Start Date End Date Taking? Authorizing Provider  ALPRAZolam Duanne Moron) 0.25 MG tablet Take one table by mouth 3 times daily as needed for agitation or insomnia. 02/09/15  Yes Debbrah Alar, NP  aspirin 325 MG EC tablet Take 325 mg by mouth daily.     Yes Historical Provider, MD  citalopram (CELEXA) 20 MG tablet Take 1 tablet (20 mg total) by mouth daily. 02/09/15  Yes Debbrah Alar, NP  Memantine HCl-Donepezil HCl (NAMZARIC) 28-10 MG CP24 Take 1 tablet by mouth daily. 12/16/14  Yes Carmen Dohmeier, MD  mometasone (NASONEX) 50 MCG/ACT nasal spray Place 1-2 sprays into the nose daily.   Yes Historical Provider, MD  Multiple Vitamin (MULTIVITAMIN WITH MINERALS) TABS tablet Take 1 tablet by mouth daily.   Yes Historical Provider, MD  nitrofurantoin (MACRODANTIN) 100 MG capsule Take 1 capsule (100 mg total) by mouth daily. 02/09/15  Yes Debbrah Alar, NP  OVER THE COUNTER MEDICATION Azo flush  'cranberry medication' reported by patient's husband. Take one daily   Yes Historical Provider, MD  oxybutynin (DITROPAN) 5 MG tablet Take 1 tablet (5 mg total) by mouth 3 (three) times daily. 02/09/15  Yes Debbrah Alar, NP  pantoprazole (PROTONIX) 40 MG tablet Take 1 tablet (40 mg total) by mouth daily before breakfast. 02/09/15  Yes Debbrah Alar, NP  potassium chloride SA (KLOR-CON M20) 20 MEQ tablet Take 1 tablet (20 mEq total) by mouth daily. 10/06/14  Yes Debbrah Alar, NP  PRESCRIPTION MEDICATION Prescription pain killer. Patient's husband does not remember the name.  [facility] has not dispensed pain killer medication recently. Last dose last night (03/15/15)   Yes Historical Provider, MD  protective barrier (RESTORE) CREA Apply twice daily to upper buttocks 02/09/15  Yes Debbrah Alar, NP  ciprofloxacin (CIPRO) 250 MG tablet Take 1 tablet (250 mg total) by mouth 2 (two) times daily. Patient not taking: Reported on 03/16/2015 03/02/15   Debbrah Alar, NP  polyethylene glycol powder (GLYCOLAX/MIRALAX) powder For constipation you can try a small amount of miralax (i.e. One teaspoon) in 8 oz of juice once daily. If diarrhea, give every day. May increase to a full cap once daily as needed. Patient not taking: Reported on 03/16/2015 02/09/15   Debbrah Alar, NP     Vital Signs: BP 112/63 mmHg  Pulse 60  Temp(Src) 98.3 F (36.8 C) (Oral)  Resp 16  Ht 4' 11.5" (1.511 m)  Wt 160 lb 11.2 oz (72.893 kg)  BMI 31.93 kg/m2  SpO2 97%  Physical Exam left PCN intact, dressing dry, site minimally tender, output over 1 liter yellow urine  Imaging: Ct Abdomen Pelvis Wo Contrast  03/16/2015   CLINICAL DATA:  Pyelonephritis  EXAM: CT ABDOMEN AND PELVIS WITHOUT CONTRAST  TECHNIQUE: Multidetector CT imaging of the abdomen and pelvis was performed following the standard protocol without IV contrast.  COMPARISON:  01/27/2008  FINDINGS: Lower chest: Streak artifact from patient's hands overlying the upper abdomen noted. Trace left greater than right pleural fluid with associated compressive atelectasis. Lung bases are otherwise clear.  Hepatobiliary: Common duct fusiform dilation at its midportion measuring 1.2 cm maximally, with tapering to  the ampulla noted. Liver is grossly unremarkable.  Pancreas: Fatty infiltrated and atrophic but grossly unremarkable otherwise.  Spleen: Normal  Adrenals/Urinary Tract: Adrenal glands appear normal. Right sided extrarenal pelvis noted but the right kidney is otherwise grossly unremarkable. 2.0 cm left ureteropelvic junction calculus  image 40, with moderate left hydronephrosis. Left mid renal calculus measures 1.3 cm image 41. Trace left perinephric fluid/stranding.  Stomach/Bowel: Colonic diverticuli noted without evidence for diverticulitis. No bowel wall thickening or focal segmental dilatation is identified.  Vascular/Lymphatic: Moderate atheromatous aortic calcification without aneurysm. Small retroperitoneal nodes are identified measuring less than 1 cm in diameter.  Other: Ovaries are unremarkable. Detail in the anatomic pelvis is obscured by streak artifact from bilateral hip arthroplasty.  No free air. Left greater than right fat containing inguinal hernias.  Musculoskeletal: Multilevel severe degenerative change of the lumbar spine. No acute osseous abnormality.  IMPRESSION: 2 cm left ureteropelvic junction calculus producing moderate left hydronephrosis.   Electronically Signed   By: Conchita Paris M.D.   On: 03/16/2015 21:48   Dg Chest 2 View  03/16/2015   CLINICAL DATA:  Decreased appetite and mental status changes.  EXAM: CHEST  2 VIEW  COMPARISON:  03/02/2015  FINDINGS: The cardiac silhouette, mediastinal and hilar contours are within normal limits and stable. There is moderate tortuosity, ectasia and calcification of the thoracic aorta. Low lung volumes with vascular crowding and bibasilar atelectasis. No definite infiltrates or effusions. The bony thorax is intact. Stable degenerative changes involving both shoulders.  IMPRESSION: Low lung volumes with vascular crowding and bibasilar atelectasis. No definite infiltrates or effusions.   Electronically Signed   By: Marijo Sanes M.D.   On: 03/16/2015 16:03   Ct Head Wo Contrast  03/16/2015   CLINICAL DATA:  Weakness and decline in appetite, abnormal behavior. Dementia, altered mental status.  EXAM: CT HEAD WITHOUT CONTRAST  TECHNIQUE: Contiguous axial images were obtained from the base of the skull through the vertex without intravenous contrast.  COMPARISON:  01/02/2011.   FINDINGS: No evidence of an acute infarct, acute hemorrhage, mass lesion, mass effect or hydrocephalus. Atrophy. Periventricular low attenuation. Visualized portions of the paranasal sinuses and mastoid air cells are clear.  IMPRESSION: 1. No acute intracranial abnormality. 2. Atrophy and chronic microvascular white matter ischemic changes.   Electronically Signed   By: Lorin Picket M.D.   On: 03/16/2015 16:12   Ir Nephrostomy Placement Left  03/18/2015   CLINICAL DATA:  Left hydronephrosis, obstructing left UPJ calculus.  EXAM: ULTRASOUND GUIDANCE FOR NEPHROSTOMY ACCESS  TEN FRENCH LEFT NEPHROSTOMY UNDER FLUOROSCOPY  Date:  9/2/20169/08/2014 9:23 am  Radiologist:  M. Daryll Brod, MD  Guidance:  Ultrasound and fluoroscopic  FLUOROSCOPY TIME:  1 minutes 24 seconds, 19.9 mGy  MEDICATIONS AND MEDICAL HISTORY: 0.5 mg Versed, 25 mcg fentanyl, patient is already receiving IV Rocephin.  ANESTHESIA/SEDATION: 8 minutes  CONTRAST:  42mL OMNIPAQUE IOHEXOL 300 MG/ML  SOLN  COMPLICATIONS: None immediate  PROCEDURE: Informed consent was obtained from the patient following explanation of the procedure, risks, benefits and alternatives. The patient understands, agrees and consents for the procedure. All questions were addressed. A time out was performed.  Maximal barrier sterile technique utilized including caps, mask, sterile gowns, sterile gloves, large sterile drape, hand hygiene, and Betadine.  Previous imaging reviewed. Patient positioned prone. Preliminary ultrasound demonstrated hydronephrosis of the left kidney. Under sterile conditions and local anesthesia, a dilated lower pole calyx was accessed percutaneously. This was performed with ultrasound. Images obtained for documentation. There  was return of cloudy urine. Sample sent for Gram stain and culture. Guidewire advanced followed by the Accustick dilator set. Amplatz guidewire exchange performed followed by tract dilatation to insert a 10 [place] drain. Retention loop  formed in the collecting system. Contrast injection confirms position. Catheter secured with a Prolene suture and connected to external gravity drainage. No immediate complication. Patient tolerated the procedure well.  IMPRESSION: Successful ultrasound and fluoroscopic left 10 French percutaneous nephrostomy.   Electronically Signed   By: Jerilynn Mages.  Shick M.D.   On: 03/18/2015 11:31    Labs:  CBC:  Recent Labs  03/04/15 1346 03/16/15 1528 03/17/15 0515 03/18/15 0450  WBC 8.8 15.4* 16.3* 8.3  HGB 14.9 15.1* 12.9 12.1  HCT 45.2 45.0 38.8 36.0  PLT 195.0 150 124* 99*    COAGS:  Recent Labs  03/17/15 1620 03/18/15 0450  INR 1.34 1.40  APTT 32 43*    BMP:  Recent Labs  03/16/15 1528 03/17/15 0515 03/18/15 0450 03/19/15 1025  NA 139 139 137 139  K 4.3 3.9 3.7 5.0  CL 101 105 105 111  CO2 30 27 29 22   GLUCOSE 149* 125* 101* 79  BUN 19 17 24* 19  CALCIUM 9.1 8.2* 7.9* 7.8*  CREATININE 1.67* 1.54* 1.71* 1.09*  GFRNONAA 27* 30* 27* 46*  GFRAA 32* 35* 31* 53*    LIVER FUNCTION TESTS:  Recent Labs  07/03/14 0737 03/02/15 1546 03/16/15 1528 03/17/15 0515  BILITOT 0.4 0.4 0.9 0.7  AST 15 15 32 36  ALT 5 7 13* 20  ALKPHOS 59 70 76 67  PROT 6.7 7.3 7.9 6.3*  ALBUMIN 2.9* 3.5 3.4* 2.8*    Assessment and Plan:  S/p left PCN 9/2 secondary to pyelonephritis/hydronephrosis/ left UPJ calculus/ ARF ; afebrile currently- Tm 101 earlier today; creat 1.09 (1.71); urine cx's -yeast -started on diflucan; cont current tx-additional plans as per urology/TRH  Signed: D. Rowe Robert 03/19/2015, 2:15 PM   I spent a total of 15 minutes at the the patient's bedside AND on the patient's hospital floor or unit, greater than 50% of which was counseling/coordinating care for left perc nephrostomy

## 2015-03-19 NOTE — Progress Notes (Signed)
CRITICAL VALUE ALERT  Critical value received:  Positive blood cultures- yeast and hyphal elements in aerobic  Date of notification:  03/19/15  Time of notification:  0613  Critical value read back:Yes.    Nurse who received alert:  Reynold Bowen, RN  MD notified (1st page):  Fredirick Maudlin  Time of first page: 0623  MD notified (2nd page):  Time of second page:  Responding MD:  Fredirick Maudlin  Time MD responded: [phone]  NP put in orders

## 2015-03-19 NOTE — Progress Notes (Signed)
ANTIBIOTIC CONSULT NOTE - INITIAL  Pharmacy Consult for Fluconazole Indication: yeast in blood  Allergies  Allergen Reactions  . Penicillins     REACTION: adolesent    Patient Measurements: Height: 4' 11.5" (151.1 cm) Weight: 160 lb 11.2 oz (72.893 kg) IBW/kg (Calculated) : 44.35 Adjusted Body Weight:   Vital Signs: Temp: 98.3 F (36.8 C) (09/03 0515) Temp Source: Oral (09/03 0515) BP: 112/63 mmHg (09/03 0515) Pulse Rate: 60 (09/03 0515) Intake/Output from previous day: 09/02 0701 - 09/03 0700 In: 1640 [P.O.:120; I.V.:1500] Out: 1050 [Urine:1050] Intake/Output from this shift: Total I/O In: 1510 [I.V.:1500; Other:10] Out: 750 [Urine:750]  Labs:  Recent Labs  03/16/15 1528 03/17/15 0515 03/18/15 0450  WBC 15.4* 16.3* 8.3  HGB 15.1* 12.9 12.1  PLT 150 124* 99*  CREATININE 1.67* 1.54* 1.71*   Estimated Creatinine Clearance: 22.3 mL/min (by C-G formula based on Cr of 1.71). No results for input(s): VANCOTROUGH, VANCOPEAK, VANCORANDOM, GENTTROUGH, GENTPEAK, GENTRANDOM, TOBRATROUGH, TOBRAPEAK, TOBRARND, AMIKACINPEAK, AMIKACINTROU, AMIKACIN in the last 72 hours.   Microbiology: Recent Results (from the past 720 hour(s))  Urine Culture     Status: None   Collection Time: 03/02/15  4:45 PM  Result Value Ref Range Status   Colony Count NO GROWTH  Final   Organism ID, Bacteria NO GROWTH  Final  Urine culture     Status: None   Collection Time: 03/16/15  2:32 PM  Result Value Ref Range Status   Specimen Description URINE, CATHETERIZED  Final   Special Requests NONE  Final   Culture   Final    20,000 COLONIES/mL YEAST Performed at [facility]    Report Status 03/18/2015 FINAL  Final  Blood culture (routine x 2)     Status: None (Preliminary result)   Collection Time: 03/16/15  5:15 PM  Result Value Ref Range Status   Specimen Description BLOOD LEFT HAND  Final   Special Requests IN PEDIATRIC BOTTLE .5CC  Final   Culture   Final    NO GROWTH 2  DAYS Performed at [facility]    Report Status PENDING  Incomplete  Blood culture (routine x 2)     Status: None (Preliminary result)   Collection Time: 03/17/15  5:15 AM  Result Value Ref Range Status   Specimen Description BLOOD LEFT ARM  Final   Special Requests BOTTLES DRAWN AEROBIC AND ANAEROBIC 5CC  Final   Culture  Setup Time   Final    YEAST HYPHAL ELEMENTS SEEN AEROBIC BOTTLE ONLY CRITICAL RESULT CALLED TO, READ BACK BY AND VERIFIED WITH: L HUDSON@0613  03/19/15 MKELLY    Culture   Final    NO GROWTH 1 DAY Performed at [facility]    Report Status PENDING  Incomplete  Urine culture     Status: None (Preliminary result)   Collection Time: 03/18/15  9:02 AM  Result Value Ref Range Status   Specimen Description URINE, CATHETERIZED  Final   Special Requests NEPHROSTOMY Performed at [facility]   Final   Culture PENDING  Incomplete   Report Status PENDING  Incomplete    Medical History: Past Medical History  Diagnosis Date  . Depressive disorder, not elsewhere classified   . Anxiety state, unspecified   . Benign neoplasm of colon   . Diverticulitis of colon (without mention of hemorrhage)   . Mixed hyperlipidemia   . Obesity, unspecified   . Unspecified essential hypertension   . Chronic rhinitis   . GERD   .  INTERTRIGO, CANDIDAL   . Major neurocognitive disorder due to Alzheimer's disease, probable, without behavioral disturbance     Followed by Dr. Brett Fairy at [facility].   Marland Kitchen PRESSURE ULCER STAGE I     sacral, recurrent  . Arthritis   . Bladder infection     Medications:  Anti-infectives    Start     Dose/Rate Route Frequency Ordered Stop   03/20/15 0800  fluconazole (DIFLUCAN) IVPB 200 mg     200 mg 100 mL/hr over 60 Minutes Intravenous Every 24 hours 03/19/15 0651     03/19/15 0700  fluconazole (DIFLUCAN) IVPB 400 mg     400 mg 100 mL/hr over 120 Minutes Intravenous  Once 03/19/15 0651     03/17/15 1700  cefTRIAXone  (ROCEPHIN) 1 g in dextrose 5 % 50 mL IVPB     1 g 100 mL/hr over 30 Minutes Intravenous Every 24 hours 03/16/15 2118     03/16/15 1615  cefTRIAXone (ROCEPHIN) 1 g in dextrose 5 % 50 mL IVPB     1 g 100 mL/hr over 30 Minutes Intravenous  Once 03/16/15 1600 03/16/15 1815     Assessment: Patient with "YEAST HYPHAL ELEMENTS SEEN" on 9/1 blood culture.  Will dose empirically for fungal infection.  Patient with poor renal function.  Goal of Therapy:  Fluconazole dosed based on patient weight and renal function   Plan:  Follow up culture results  Fluconazole 400mg  iv x1, then 200mg  iv q24hr  Tyler Deis, Elissia Spiewak [place] 03/19/2015,6:59 AM

## 2015-03-19 NOTE — Progress Notes (Signed)
Progress Note   ETHIE CURLESS HCW:[id number] DOB: 04/18/1931 DOA: 03/16/2015 PCP: Nance Pear., NP   Brief Narrative:   [name] is an 81 y.o. female the PMH of chronic respiratory failure on home oxygen, recurrent UTI on chronic prophylactic antibiotics therapy with Macrodantin, depression/anxiety and obesity who was admitted on 03/16/15 with altered mental status and malaise with decreased oral intake. On initial evaluation in the ER, the patient was found to have a UTI with possible pyelonephritis and acute kidney injury with creatinine of 1.67.  Assessment/Plan:   Principal Problem:   Pyelonephritis/lower UTI with left nephrolithiasis and hydronephrosis - On empiric Rocephin, follow-up urine cultures. - Status post percutaneous nephrostomy tube by IR. - Outpatient follow-up with urology for definitive stone management.  Active Problems:   Acute kidney injury - Secondary to obstructing nephrolithiasis, hydronephrosis, and decreased oral intake. - Baseline creatinine is 1.01.    Blood cultures positive for yeast - Diflucan started 03/19/15.    Decubitus ulcer of sacral region, stage 1 - Skin care per nursing.    Major neurocognitive disorder, due to alzheimer's disease, with behavioral disturbance, mild/CVD - Had worsening of cognitive status on admission. Still confused per daughter. - CT of the head negative for acute findings, but showed atrophy and chronic cerebrovascular disease. - Continue Aricept and Namenda.    Acute encephalopathy - Secondary to acute infection, management as above.    Thrombocytopenia  - Discontinue heparin. Platelets were normal 03/16/15. Platelet count now 99.    DVT Prophylaxis - Heparin discontinued secondary to thrombocytopenia. Start SCDs.  Family Communication: Daughter [name] at bedside. Disposition Plan: Likely will need a SNF for rehab, lives with husband. Can be discharged once renal function back to baseline,  encephalopathy improved and cultures back, likely another 1-2 days. Code Status:     Code Status Orders        Start     Ordered   03/16/15 1758  Full code   Continuous     03/16/15 1758    Advance Directive Documentation        Most Recent Value   Type of Advance Directive  Healthcare Power of Attorney, Out of facility DNR (pink MOST or yellow form)   Pre-existing out of facility DNR order (yellow form or pink MOST form)  -- [family member did not bring DNR paperwork]   "MOST" Form in Place?          IV Access:    Peripheral IV   Procedures and diagnostic studies:   Ct Abdomen Pelvis Wo Contrast  03/16/2015   CLINICAL DATA:  Pyelonephritis  EXAM: CT ABDOMEN AND PELVIS WITHOUT CONTRAST  TECHNIQUE: Multidetector CT imaging of the abdomen and pelvis was performed following the standard protocol without IV contrast.  COMPARISON:  01/27/2008  FINDINGS: Lower chest: Streak artifact from patient's hands overlying the upper abdomen noted. Trace left greater than right pleural fluid with associated compressive atelectasis. Lung bases are otherwise clear.  Hepatobiliary: Common duct fusiform dilation at its midportion measuring 1.2 cm maximally, with tapering to the ampulla noted. Liver is grossly unremarkable.  Pancreas: Fatty infiltrated and atrophic but grossly unremarkable otherwise.  Spleen: Normal  Adrenals/Urinary Tract: Adrenal glands appear normal. Right sided extrarenal pelvis noted but the right kidney is otherwise grossly unremarkable. 2.0 cm left ureteropelvic junction calculus image 40, with moderate left hydronephrosis. Left mid renal calculus measures 1.3 cm image 41. Trace left perinephric fluid/stranding.  Stomach/Bowel: Colonic diverticuli noted without evidence for  diverticulitis. No bowel wall thickening or focal segmental dilatation is identified.  Vascular/Lymphatic: Moderate atheromatous aortic calcification without aneurysm. Small retroperitoneal nodes are identified  measuring less than 1 cm in diameter.  Other: Ovaries are unremarkable. Detail in the anatomic pelvis is obscured by streak artifact from bilateral hip arthroplasty.  No free air. Left greater than right fat containing inguinal hernias.  Musculoskeletal: Multilevel severe degenerative change of the lumbar spine. No acute osseous abnormality.  IMPRESSION: 2 cm left ureteropelvic junction calculus producing moderate left hydronephrosis.   Electronically Signed   By: Conchita Paris M.D.   On: 03/16/2015 21:48   Dg Chest 2 View  03/16/2015   CLINICAL DATA:  Decreased appetite and mental status changes.  EXAM: CHEST  2 VIEW  COMPARISON:  03/02/2015  FINDINGS: The cardiac silhouette, mediastinal and hilar contours are within normal limits and stable. There is moderate tortuosity, ectasia and calcification of the thoracic aorta. Low lung volumes with vascular crowding and bibasilar atelectasis. No definite infiltrates or effusions. The bony thorax is intact. Stable degenerative changes involving both shoulders.  IMPRESSION: Low lung volumes with vascular crowding and bibasilar atelectasis. No definite infiltrates or effusions.   Electronically Signed   By: Marijo Sanes M.D.   On: 03/16/2015 16:03   Ct Head Wo Contrast  03/16/2015   CLINICAL DATA:  Weakness and decline in appetite, abnormal behavior. Dementia, altered mental status.  EXAM: CT HEAD WITHOUT CONTRAST  TECHNIQUE: Contiguous axial images were obtained from the base of the skull through the vertex without intravenous contrast.  COMPARISON:  01/02/2011.  FINDINGS: No evidence of an acute infarct, acute hemorrhage, mass lesion, mass effect or hydrocephalus. Atrophy. Periventricular low attenuation. Visualized portions of the paranasal sinuses and mastoid air cells are clear.  IMPRESSION: 1. No acute intracranial abnormality. 2. Atrophy and chronic microvascular white matter ischemic changes.   Electronically Signed   By: Lorin Picket M.D.   On:  03/16/2015 16:12   Ir Nephrostomy Placement Left  03/18/2015   CLINICAL DATA:  Left hydronephrosis, obstructing left UPJ calculus.  EXAM: ULTRASOUND GUIDANCE FOR NEPHROSTOMY ACCESS  TEN FRENCH LEFT NEPHROSTOMY UNDER FLUOROSCOPY  Date:  9/2/20169/08/2014 9:23 am  Radiologist:  M. Daryll Brod, MD  Guidance:  Ultrasound and fluoroscopic  FLUOROSCOPY TIME:  1 minutes 24 seconds, 19.9 mGy  MEDICATIONS AND MEDICAL HISTORY: 0.5 mg Versed, 25 mcg fentanyl, patient is already receiving IV Rocephin.  ANESTHESIA/SEDATION: 8 minutes  CONTRAST:  15mL OMNIPAQUE IOHEXOL 300 MG/ML  SOLN  COMPLICATIONS: None immediate  PROCEDURE: Informed consent was obtained from the patient following explanation of the procedure, risks, benefits and alternatives. The patient understands, agrees and consents for the procedure. All questions were addressed. A time out was performed.  Maximal barrier sterile technique utilized including caps, mask, sterile gowns, sterile gloves, large sterile drape, hand hygiene, and Betadine.  Previous imaging reviewed. Patient positioned prone. Preliminary ultrasound demonstrated hydronephrosis of the left kidney. Under sterile conditions and local anesthesia, a dilated lower pole calyx was accessed percutaneously. This was performed with ultrasound. Images obtained for documentation. There was return of cloudy urine. Sample sent for Gram stain and culture. Guidewire advanced followed by the Accustick dilator set. Amplatz guidewire exchange performed followed by tract dilatation to insert a 10 [place] drain. Retention loop formed in the collecting system. Contrast injection confirms position. Catheter secured with a Prolene suture and connected to external gravity drainage. No immediate complication. Patient tolerated the procedure well.  IMPRESSION: Successful ultrasound and fluoroscopic left 10  French percutaneous nephrostomy.   Electronically Signed   By: Jerilynn Mages.  Shick M.D.   On: 03/18/2015 11:31     Medical  Consultants:   Ileene Musa, [facility] Nickie Retort, MD, Urology   Anti-Infectives:    Rocephin 03/17/15--->  Diflucan 03/19/15--->  Subjective:   Royetta Crochet remains more confused than she is at baseline and is still spiking fevers, up to 101.22F overnight. Her daughter is at the bedside and tells me that she is more confused than her baseline. Patient is groggy and is talking incoherently at times. Unable to elicit that she has any specific symptoms.  Objective:    Filed Vitals:   03/18/15 2138 03/19/15 0057 03/19/15 0238 03/19/15 0515  BP:    112/63  Pulse:    60  Temp: 99.4 F (37.4 C) 101.1 F (38.4 C) 99 F (37.2 C) 98.3 F (36.8 C)  TempSrc: Oral   Oral  Resp:    16  Height:      Weight:      SpO2:    97%    Intake/Output Summary (Last 24 hours) at 03/19/15 0841 Last data filed at 03/19/15 0658  Gross per 24 hour  Intake   1640 ml  Output   1050 ml  Net    590 ml    Exam: Gen:  NAD, confused, somnolent Cardiovascular:  RRR, II/VI murmur Respiratory:  Lungs dimnished Gastrointestinal:  Abdomen soft, NT/ND, + BS Extremities:  No C/E/C, SCDs on   Data Reviewed:    Labs: Basic Metabolic Panel:  Recent Labs Lab 03/16/15 1528 03/16/15 2050 03/17/15 0515 03/18/15 0450  NA 139  --  139 137  K 4.3  --  3.9 3.7  CL 101  --  105 105  CO2 30  --  27 29  GLUCOSE 149*  --  125* 101*  BUN 19  --  17 24*  CREATININE 1.67*  --  1.54* 1.71*  CALCIUM 9.1  --  8.2* 7.9*  MG  --  1.8  --   --    GFR Estimated Creatinine Clearance: 22.3 mL/min (by C-G formula based on Cr of 1.71). Liver Function Tests:  Recent Labs Lab 03/16/15 1528 03/17/15 0515  AST 32 36  ALT 13* 20  ALKPHOS 76 67  BILITOT 0.9 0.7  PROT 7.9 6.3*  ALBUMIN 3.4* 2.8*    Recent Labs Lab 03/16/15 1529  AMMONIA 21   Coagulation profile  Recent Labs Lab 03/17/15 1620 03/18/15 0450  INR 1.34 1.40    CBC:  Recent Labs Lab 03/16/15 1528  03/17/15 0515 03/18/15 0450  WBC 15.4* 16.3* 8.3  NEUTROABS 12.4*  --   --   HGB 15.1* 12.9 12.1  HCT 45.0 38.8 36.0  MCV 99.8 99.2 98.4  PLT 150 124* 99*   Cardiac Enzymes:  Recent Labs Lab 03/16/15 2050 03/17/15 0515 03/17/15 1130  TROPONINI <0.03 0.03 0.04*   BNP (last 3 results)  Recent Labs  07/02/14 1838  PROBNP 470.0*   CBG:  Recent Labs Lab 03/16/15 1532  GLUCAP 131*   Thyroid function studies:  Recent Labs  03/16/15 2050  TSH 1.245   Sepsis Labs:  Recent Labs Lab 03/16/15 1528 03/17/15 0515 03/18/15 0450  WBC 15.4* 16.3* 8.3   Microbiology Recent Results (from the past 240 hour(s))  Urine culture     Status: None   Collection Time: 03/16/15  2:32 PM  Result Value Ref Range Status   Specimen Description  URINE, CATHETERIZED  Final   Special Requests NONE  Final   Culture   Final    20,000 COLONIES/mL YEAST Performed at [facility]    Report Status 03/18/2015 FINAL  Final  Blood culture (routine x 2)     Status: None (Preliminary result)   Collection Time: 03/16/15  5:15 PM  Result Value Ref Range Status   Specimen Description BLOOD LEFT HAND  Final   Special Requests IN PEDIATRIC BOTTLE .5CC  Final   Culture   Final    NO GROWTH 2 DAYS Performed at [facility]    Report Status PENDING  Incomplete  Blood culture (routine x 2)     Status: None (Preliminary result)   Collection Time: 03/17/15  5:15 AM  Result Value Ref Range Status   Specimen Description BLOOD LEFT ARM  Final   Special Requests BOTTLES DRAWN AEROBIC AND ANAEROBIC 5CC  Final   Culture  Setup Time   Final    YEAST HYPHAL ELEMENTS SEEN AEROBIC BOTTLE ONLY CRITICAL RESULT CALLED TO, READ BACK BY AND VERIFIED WITH: L HUDSON@0613  03/19/15 MKELLY    Culture   Final    NO GROWTH 1 DAY Performed at [facility]    Report Status PENDING  Incomplete  Urine culture     Status: None (Preliminary result)   Collection Time: 03/18/15  9:02 AM  Result  Value Ref Range Status   Specimen Description URINE, CATHETERIZED  Final   Special Requests NEPHROSTOMY Performed at [facility]   Final   Culture PENDING  Incomplete   Report Status PENDING  Incomplete     Medications:   . aspirin  325 mg Oral Daily  . cefTRIAXone (ROCEPHIN)  IV  1 g Intravenous Q24H  . citalopram  20 mg Oral Daily  . donepezil  10 mg Oral Daily  . feeding supplement (ENSURE ENLIVE)  237 mL Oral BID BM  . [START ON 03/20/2015] fluconazole (DIFLUCAN) IV  200 mg Intravenous Q24H  . fluconazole (DIFLUCAN) IV  400 mg Intravenous Once  . heparin subcutaneous  5,000 Units Subcutaneous 3 times per day  . memantine  28 mg Oral Daily  . oxybutynin  5 mg Oral TID  . pantoprazole  40 mg Oral QAC breakfast  . sodium chloride  3 mL Intravenous Q12H   Continuous Infusions: . sodium chloride 75 mL/hr at 03/19/15 0610    Time spent: 35 minutes with > 50% of time discussing current diagnostic test results, clinical impression and plan of care with patient's daughter at the bedside. I answered all of her questions to her satisfaction.   LOS: 3 days   [facility] Pager [phone]. If unable to reach me by pager, please call my cell phone at [phone].  *Please refer to [email or web address], password [email or web address] to get updated schedule on who will round on this patient, as hospitalists switch teams weekly. If 7PM-7AM, please contact night-coverage at [email or web address], password [email or web address] for any overnight needs.  03/19/2015, 8:41 AM

## 2015-03-20 DIAGNOSIS — D649 Anemia, unspecified: Secondary | ICD-10-CM

## 2015-03-20 DIAGNOSIS — N390 Urinary tract infection, site not specified: Secondary | ICD-10-CM | POA: Insufficient documentation

## 2015-03-20 DIAGNOSIS — B49 Unspecified mycosis: Secondary | ICD-10-CM | POA: Diagnosis present

## 2015-03-20 DIAGNOSIS — B377 Candidal sepsis: Secondary | ICD-10-CM | POA: Insufficient documentation

## 2015-03-20 LAB — BASIC METABOLIC PANEL
ANION GAP: 6 (ref 5–15)
BUN: 18 mg/dL (ref 6–20)
CALCIUM: 7.8 mg/dL — AB (ref 8.9–10.3)
CHLORIDE: 104 mmol/L (ref 101–111)
CO2: 27 mmol/L (ref 22–32)
Creatinine, Ser: 0.84 mg/dL (ref 0.44–1.00)
GFR calc non Af Amer: >60 mL/min (ref >60)
Glucose, Bld: 144 mg/dL — ABNORMAL HIGH (ref 65–99)
Potassium: 2.9 mmol/L — ABNORMAL LOW (ref 3.5–5.1)
Sodium: 137 mmol/L (ref 135–145)

## 2015-03-20 LAB — CBC
HEMATOCRIT: 32.3 % — AB (ref 36.0–46.0)
HEMOGLOBIN: 10.8 g/dL — AB (ref 12.0–15.0)
MCH: 32.2 pg (ref 26.0–34.0)
MCHC: 33.4 g/dL (ref 30.0–36.0)
MCV: 96.4 fL (ref 78.0–100.0)
Platelets: 103 10*3/uL — ABNORMAL LOW (ref 150–400)
RBC: 3.35 MIL/uL — AB (ref 3.87–5.11)
RDW: 14.3 % (ref 11.5–15.5)
WBC: 8.5 10*3/uL (ref 4.0–10.5)

## 2015-03-20 MED ORDER — ANIDULAFUNGIN 100 MG IV SOLR
100.0000 mg | INTRAVENOUS | Status: DC
  Filled 2015-03-20: qty 100

## 2015-03-20 MED ORDER — POTASSIUM CHLORIDE 10 MEQ/100ML IV SOLN
10.0000 meq | INTRAVENOUS | Status: AC
  Administered 2015-03-20 (×2): 10 meq via INTRAVENOUS
  Filled 2015-03-20: qty 100

## 2015-03-20 MED ORDER — POTASSIUM CHLORIDE 10 MEQ/100ML IV SOLN
10.0000 meq | INTRAVENOUS | Status: AC
  Administered 2015-03-20 (×2): 10 meq via INTRAVENOUS
  Filled 2015-03-20 (×2): qty 100

## 2015-03-20 MED ORDER — SODIUM CHLORIDE 0.9 % IV SOLN: 200.0000 mg | INTRAVENOUS | Status: DC

## 2015-03-20 MED ORDER — SODIUM CHLORIDE 0.9 % IV SOLN
200.0000 mg | Freq: Once | INTRAVENOUS | Status: AC
  Administered 2015-03-20: 200 mg via INTRAVENOUS
  Filled 2015-03-20: qty 200

## 2015-03-20 MED ORDER — POTASSIUM CHLORIDE IN NACL 40-0.9 MEQ/L-% IV SOLN
INTRAVENOUS | Status: DC
  Administered 2015-03-20 – 2015-03-22 (×3): 75 mL/h via INTRAVENOUS
  Filled 2015-03-20 (×6): qty 1000

## 2015-03-20 NOTE — Progress Notes (Signed)
Progress Note   [name]:[id number] DOB: 1931-01-18 DOA: 03/16/2015 PCP: Cristina Pear., NP   Brief Narrative:   [name] is an 81 y.o. female the PMH of chronic respiratory failure on home oxygen, recurrent UTI on chronic prophylactic antibiotics therapy with Macrodantin, depression/anxiety and obesity who was admitted on 03/16/15 with altered mental status and malaise with decreased oral intake. On initial evaluation in the ER, the patient was found to have a UTI with possible pyelonephritis and acute kidney injury with creatinine of 1.67.  Assessment/Plan:   Principal Problem:   Pyelonephritis/lower UTI with left nephrolithiasis and hydronephrosis - On empiric Rocephin and Diflucan, urine cultures positive for yeast. Repeat blood cultures done. - Status post percutaneous nephrostomy tube by IR. - Outpatient follow-up with urology for definitive stone management.  Active Problems:   Normocytic anemia - Likely secondary to anemia of chronic disease. Drop in hemoglobin likely dilutional.    Hypokalemia - Replete.    Acute kidney injury - Secondary to obstructing nephrolithiasis, hydronephrosis, and decreased oral intake. - Baseline creatinine is 1.01. Creatinine now back to baseline values.    Blood cultures positive for yeast - Diflucan started 03/19/15.    Decubitus ulcer of sacral region, stage 1 - Skin care per nursing.    Major neurocognitive disorder, due to alzheimer's disease, with behavioral disturbance, mild/CVD - Had worsening of cognitive status on admission. Slowly improving. - CT of the head negative for acute findings, but showed atrophy and chronic cerebrovascular disease. - Continue Aricept and Namenda.    Acute encephalopathy - Secondary to acute infection, management as above.    Thrombocytopenia  - Discontinue heparin. Platelets were normal 03/16/15. Platelet count now 99---> 103 after stopping heparin.    DVT  Prophylaxis - Heparin discontinued secondary to thrombocytopenia. Continue SCDs.  Family Communication: Daughters updated at bedside. Disposition Plan: Likely will need a SNF for rehab, lives with husband. Can be discharged once renal function back to baseline, encephalopathy improved and cultures back, likely another 1-2 days. Code Status:     Code Status Orders        Start     Ordered   03/16/15 1758  Full code   Continuous     03/16/15 1758    Advance Directive Documentation        Most Recent Value   Type of Advance Directive  Healthcare Power of Attorney, Out of facility DNR (pink MOST or yellow form)   Pre-existing out of facility DNR order (yellow form or pink MOST form)  -- [family member did not bring DNR paperwork]   "MOST" Form in Place?          IV Access:    Peripheral IV   Procedures and diagnostic studies:   Ct Abdomen Pelvis Wo Contrast  03/16/2015   CLINICAL DATA:  Pyelonephritis  EXAM: CT ABDOMEN AND PELVIS WITHOUT CONTRAST  TECHNIQUE: Multidetector CT imaging of the abdomen and pelvis was performed following the standard protocol without IV contrast.  COMPARISON:  01/27/2008  FINDINGS: Lower chest: Streak artifact from patient's hands overlying the upper abdomen noted. Trace left greater than right pleural fluid with associated compressive atelectasis. Lung bases are otherwise clear.  Hepatobiliary: Common duct fusiform dilation at its midportion measuring 1.2 cm maximally, with tapering to the ampulla noted. Liver is grossly unremarkable.  Pancreas: Fatty infiltrated and atrophic but grossly unremarkable otherwise.  Spleen: Normal  Adrenals/Urinary Tract: Adrenal glands appear normal. Right sided extrarenal pelvis noted but  the right kidney is otherwise grossly unremarkable. 2.0 cm left ureteropelvic junction calculus image 40, with moderate left hydronephrosis. Left mid renal calculus measures 1.3 cm image 41. Trace left perinephric fluid/stranding.   Stomach/Bowel: Colonic diverticuli noted without evidence for diverticulitis. No bowel wall thickening or focal segmental dilatation is identified.  Vascular/Lymphatic: Moderate atheromatous aortic calcification without aneurysm. Small retroperitoneal nodes are identified measuring less than 1 cm in diameter.  Other: Ovaries are unremarkable. Detail in the anatomic pelvis is obscured by streak artifact from bilateral hip arthroplasty.  No free air. Left greater than right fat containing inguinal hernias.  Musculoskeletal: Multilevel severe degenerative change of the lumbar spine. No acute osseous abnormality.  IMPRESSION: 2 cm left ureteropelvic junction calculus producing moderate left hydronephrosis.   Electronically Signed   By: Conchita Paris M.D.   On: 03/16/2015 21:48   Dg Chest 2 View  03/16/2015   CLINICAL DATA:  Decreased appetite and mental status changes.  EXAM: CHEST  2 VIEW  COMPARISON:  03/02/2015  FINDINGS: The cardiac silhouette, mediastinal and hilar contours are within normal limits and stable. There is moderate tortuosity, ectasia and calcification of the thoracic aorta. Low lung volumes with vascular crowding and bibasilar atelectasis. No definite infiltrates or effusions. The bony thorax is intact. Stable degenerative changes involving both shoulders.  IMPRESSION: Low lung volumes with vascular crowding and bibasilar atelectasis. No definite infiltrates or effusions.   Electronically Signed   By: Marijo Sanes M.D.   On: 03/16/2015 16:03   Ct Head Wo Contrast  03/16/2015   CLINICAL DATA:  Weakness and decline in appetite, abnormal behavior. Dementia, altered mental status.  EXAM: CT HEAD WITHOUT CONTRAST  TECHNIQUE: Contiguous axial images were obtained from the base of the skull through the vertex without intravenous contrast.  COMPARISON:  01/02/2011.  FINDINGS: No evidence of an acute infarct, acute hemorrhage, mass lesion, mass effect or hydrocephalus. Atrophy. Periventricular low  attenuation. Visualized portions of the paranasal sinuses and mastoid air cells are clear.  IMPRESSION: 1. No acute intracranial abnormality. 2. Atrophy and chronic microvascular white matter ischemic changes.   Electronically Signed   By: Lorin Picket M.D.   On: 03/16/2015 16:12   Ir Nephrostomy Placement Left  03/18/2015   CLINICAL DATA:  Left hydronephrosis, obstructing left UPJ calculus.  EXAM: ULTRASOUND GUIDANCE FOR NEPHROSTOMY ACCESS  TEN FRENCH LEFT NEPHROSTOMY UNDER FLUOROSCOPY  Date:  9/2/20169/08/2014 9:23 am  Radiologist:  M. Daryll Brod, MD  Guidance:  Ultrasound and fluoroscopic  FLUOROSCOPY TIME:  1 minutes 24 seconds, 19.9 mGy  MEDICATIONS AND MEDICAL HISTORY: 0.5 mg Versed, 25 mcg fentanyl, patient is already receiving IV Rocephin.  ANESTHESIA/SEDATION: 8 minutes  CONTRAST:  36mL OMNIPAQUE IOHEXOL 300 MG/ML  SOLN  COMPLICATIONS: None immediate  PROCEDURE: Informed consent was obtained from the patient following explanation of the procedure, risks, benefits and alternatives. The patient understands, agrees and consents for the procedure. All questions were addressed. A time out was performed.  Maximal barrier sterile technique utilized including caps, mask, sterile gowns, sterile gloves, large sterile drape, hand hygiene, and Betadine.  Previous imaging reviewed. Patient positioned prone. Preliminary ultrasound demonstrated hydronephrosis of the left kidney. Under sterile conditions and local anesthesia, a dilated lower pole calyx was accessed percutaneously. This was performed with ultrasound. Images obtained for documentation. There was return of cloudy urine. Sample sent for Gram stain and culture. Guidewire advanced followed by the Accustick dilator set. Amplatz guidewire exchange performed followed by tract dilatation to insert a 10 [place]  drain. Retention loop formed in the collecting system. Contrast injection confirms position. Catheter secured with a Prolene suture and connected to  external gravity drainage. No immediate complication. Patient tolerated the procedure well.  IMPRESSION: Successful ultrasound and fluoroscopic left 10 French percutaneous nephrostomy.   Electronically Signed   By: Jerilynn Mages.  Shick M.D.   On: 03/18/2015 11:31     Medical Consultants:   Ileene Musa, [facility] Nickie Retort, MD, Urology   Anti-Infectives:    Rocephin 03/17/15--->  Diflucan 03/19/15--->  Subjective:   [name] is more awake and alert, once her IV out, but otherwise no complaints of pain, nausea, or vomiting. No dyspnea or cough. Still spiking some low-grade fevers.  Objective:    Filed Vitals:   03/19/15 1421 03/19/15 2254 03/20/15 0100 03/20/15 0659  BP: 122/54 133/71  153/67  Pulse: 58 68  77  Temp: 99.2 F (37.3 C) 100 F (37.8 C) 98.9 F (37.2 C) 98.5 F (36.9 C)  TempSrc: Oral   Oral  Resp: 16 18  20   Height:      Weight:      SpO2: 100% 96%  96%    Intake/Output Summary (Last 24 hours) at 03/20/15 0817 Last data filed at 03/20/15 0659  Gross per 24 hour  Intake   1310 ml  Output   1050 ml  Net    260 ml    Exam: Gen:  NAD, more awake today Cardiovascular:  RRR, II/VI murmur Respiratory:  Lungs dimnished Gastrointestinal:  Abdomen soft, NT/ND, + BS Extremities:  No C/E/C, SCDs on   Data Reviewed:    Labs: Basic Metabolic Panel:  Recent Labs Lab 03/16/15 1528 03/16/15 2050 03/17/15 0515 03/18/15 0450 03/19/15 1025 03/20/15 0452  NA 139  --  139 137 139 137  K 4.3  --  3.9 3.7 5.0 2.9*  CL 101  --  105 105 111 104  CO2 30  --  27 29 22 27   GLUCOSE 149*  --  125* 101* 79 144*  BUN 19  --  17 24* 19 18  CREATININE 1.67*  --  1.54* 1.71* 1.09* 0.84  CALCIUM 9.1  --  8.2* 7.9* 7.8* 7.8*  MG  --  1.8  --   --   --   --    GFR Estimated Creatinine Clearance: 45.5 mL/min (by C-G formula based on Cr of 0.84). Liver Function Tests:  Recent Labs Lab 03/16/15 1528 03/17/15 0515  AST 32 36  ALT 13* 20   ALKPHOS 76 67  BILITOT 0.9 0.7  PROT 7.9 6.3*  ALBUMIN 3.4* 2.8*    Recent Labs Lab 03/16/15 1529  AMMONIA 21   Coagulation profile  Recent Labs Lab 03/17/15 1620 03/18/15 0450  INR 1.34 1.40    CBC:  Recent Labs Lab 03/16/15 1528 03/17/15 0515 03/18/15 0450 03/20/15 0452  WBC 15.4* 16.3* 8.3 8.5  NEUTROABS 12.4*  --   --   --   HGB 15.1* 12.9 12.1 10.8*  HCT 45.0 38.8 36.0 32.3*  MCV 99.8 99.2 98.4 96.4  PLT 150 124* 99* 103*   Cardiac Enzymes:  Recent Labs Lab 03/16/15 2050 03/17/15 0515 03/17/15 1130  TROPONINI <0.03 0.03 0.04*   BNP (last 3 results)  Recent Labs  07/02/14 1838  PROBNP 470.0*   CBG:  Recent Labs Lab 03/16/15 1532  GLUCAP 131*   Sepsis Labs:  Recent Labs Lab 03/16/15 1528 03/17/15 0515 03/18/15 0450 03/20/15 0452  WBC  15.4* 16.3* 8.3 8.5   Microbiology Recent Results (from the past 240 hour(s))  Urine culture     Status: None   Collection Time: 03/16/15  2:32 PM  Result Value Ref Range Status   Specimen Description URINE, CATHETERIZED  Final   Special Requests NONE  Final   Culture   Final    20,000 COLONIES/mL YEAST Performed at [facility]    Report Status 03/18/2015 FINAL  Final  Blood culture (routine x 2)     Status: None (Preliminary result)   Collection Time: 03/16/15  5:15 PM  Result Value Ref Range Status   Specimen Description BLOOD LEFT HAND  Final   Special Requests IN PEDIATRIC BOTTLE .5CC  Final   Culture   Final    NO GROWTH 3 DAYS Performed at [facility]    Report Status PENDING  Incomplete  Blood culture (routine x 2)     Status: None (Preliminary result)   Collection Time: 03/17/15  5:15 AM  Result Value Ref Range Status   Specimen Description BLOOD LEFT ARM  Final   Special Requests BOTTLES DRAWN AEROBIC AND ANAEROBIC 5CC  Final   Culture  Setup Time   Final    YEAST HYPHAL ELEMENTS SEEN AEROBIC BOTTLE ONLY CRITICAL RESULT CALLED TO, READ BACK BY AND VERIFIED  WITH: L [email or web address]@0613  03/19/15 MKELLY    Culture   Final    NO GROWTH 2 DAYS Performed at [facility]    Report Status PENDING  Incomplete  Urine culture     Status: None   Collection Time: 03/18/15  9:02 AM  Result Value Ref Range Status   Specimen Description URINE, CATHETERIZED  Final   Special Requests NEPHROSTOMY  Final   Culture   Final    >=100,000 COLONIES/mL YEAST Performed at [facility]    Report Status 03/19/2015 FINAL  Final     Medications:   . aspirin  325 mg Oral Daily  . cefTRIAXone (ROCEPHIN)  IV  1 g Intravenous Q24H  . citalopram  20 mg Oral Daily  . donepezil  10 mg Oral Daily  . feeding supplement (ENSURE ENLIVE)  237 mL Oral BID BM  . fluconazole (DIFLUCAN) IV  200 mg Intravenous Q24H  . memantine  28 mg Oral Daily  . oxybutynin  5 mg Oral TID  . pantoprazole  40 mg Oral QAC breakfast  . polyethylene glycol  17 g Oral Daily  . potassium chloride  10 mEq Intravenous Q1 Hr x 2  . sodium chloride  3 mL Intravenous Q12H   Continuous Infusions: . sodium chloride 75 mL/hr at 03/19/15 2127    Time spent: 35 minutes with > 50% of time discussing current diagnostic test results, clinical impression and plan of care with patient's daughters at the bedside. I answered all of their questions.   LOS: 4 days   [facility] Pager [phone]. If unable to reach me by pager, please call my cell phone at [phone].  *Please refer to [email or web address], password [email or web address] to get updated schedule on who will round on this patient, as hospitalists switch teams weekly. If 7PM-7AM, please contact night-coverage at [email or web address], password [email or web address] for any overnight needs.  03/20/2015, 8:17 AM

## 2015-03-20 NOTE — Consult Note (Signed)
[facility] for Infectious Disease    Date of Admission:  03/16/2015  Date of Consult:  03/20/2015  Reason for Consult: Candidemia Referring Physician: "auto consult" and Dr. Rockne Menghini   HPI: [name] is an 81 y.o. female with recurrent UTI's who was admitted with kidney stone with obstructing kidney stone on left side, septic picture sp nephrostomy tube. She has since grown yeast (likely candida) from urine and from blood 2/2.  She suffers from multiple comorbid conditions including dementia.   Past Medical History  Diagnosis Date  . Depressive disorder, not elsewhere classified   . Anxiety state, unspecified   . Benign neoplasm of colon   . Diverticulitis of colon (without mention of hemorrhage)   . Mixed hyperlipidemia   . Obesity, unspecified   . Unspecified essential hypertension   . Chronic rhinitis   . GERD   . INTERTRIGO, CANDIDAL   . Major neurocognitive disorder due to Alzheimer's disease, probable, without behavioral disturbance     Followed by Dr. Brett Fairy at [facility].   Marland Kitchen PRESSURE ULCER STAGE I     sacral, recurrent  . Arthritis   . Bladder infection     Past Surgical History  Procedure Laterality Date  . Cholecystectomy    . Total hip arthroplasty Bilateral 1991, 2001  . Vesicovaginal fistula closure w/ tah    . Rotator cuff repair    . Abdominal hysterectomy  1980  . Cesarean section    ergies:   Allergies  Allergen Reactions  . Penicillins     REACTION: adolesent     Medications: I have reviewed patients current medications as documented in Epic Anti-infectives    Start     Dose/Rate Route Frequency Ordered Stop   03/21/15 1800  anidulafungin (ERAXIS) 100 mg in sodium chloride 0.9 % 100 mL IVPB     100 mg over 90 Minutes Intravenous Every 24 hours 03/20/15 1634     03/20/15 1800  anidulafungin (ERAXIS) 200 mg in sodium chloride 0.9 % 200 mL IVPB     200 mg over 180 Minutes Intravenous  Once 03/20/15  1635     03/20/15 1645  anidulafungin (ERAXIS) 200 mg in sodium chloride 0.9 % 200 mL IVPB  Status:  Discontinued     200 mg over 180 Minutes Intravenous Every 24 hours 03/20/15 1634 03/20/15 1635   03/20/15 0800  fluconazole (DIFLUCAN) IVPB 200 mg  Status:  Discontinued     200 mg 100 mL/hr over 60 Minutes Intravenous Every 24 hours 03/19/15 0651 03/20/15 1634   03/19/15 0700  fluconazole (DIFLUCAN) IVPB 400 mg     400 mg 100 mL/hr over 120 Minutes Intravenous  Once 03/19/15 0651 03/19/15 0947   03/17/15 1700  cefTRIAXone (ROCEPHIN) 1 g in dextrose 5 % 50 mL IVPB  Status:  Discontinued     1 g 100 mL/hr over 30 Minutes Intravenous Every 24 hours 03/16/15 2118 03/20/15 1633   03/16/15 1615  cefTRIAXone (ROCEPHIN) 1 g in dextrose 5 % 50 mL IVPB     1 g 100 mL/hr over 30 Minutes Intravenous  Once 03/16/15 1600 03/16/15 1815      Social History:  reports that she quit smoking about 56 years ago. Her smoking use included Cigarettes. She has a .4 pack-year smoking history. She has never used smokeless tobacco. She reports that she does not drink alcohol or use illicit drugs.   Family History  Problem Relation Age of Onset  . Breast cancer Mother   . Colon cancer Mother       ROS: not obtainable due to the patient's dementia   Blood pressure 145/60, pulse 60, temperature 99.1 F (37.3 C), temperature source Oral, resp. rate 20, height 4' 11.5" (1.511 m), weight 160 lb 11.2 oz (72.893 kg), SpO2 97 %. General: Alert and awake, delirious repeatedly saying "I love you" to me HEENT: anicteric sclera,  EOMI, oropharynx clear and without exudate CVS regular rate, normal r,  no murmur rubs or gallops Chest: clear to auscultation bilaterally, no wheezing, rales or rhonchi Abdomen: soft nontender, nondistended, normal bowel sounds, Extremities: no  clubbing or edema noted bilaterally Skin: no rashes Neuro: nonfocal   Results for orders placed or performed during the hospital encounter of  03/16/15 (from the past 48 hour(s))  Basic metabolic panel     Status: Abnormal   Collection Time: 03/19/15 10:25 AM  Result Value Ref Range   Sodium 139 135 - 145 mmol/L   Potassium 5.0 3.5 - 5.1 mmol/L    Comment: DELTA CHECK NOTED   Chloride 111 101 - 111 mmol/L   CO2 22 22 - 32 mmol/L   Glucose, Bld 79 65 - 99 mg/dL   BUN 19 6 - 20 mg/dL   Creatinine, Ser 1.09 (H) 0.44 - 1.00 mg/dL   Calcium 7.8 (L) 8.9 - 10.3 mg/dL   GFR calc non Af Amer 46 (L) >60 mL/min   GFR calc Af Amer 53 (L) >60 mL/min    Comment: (NOTE) The eGFR has been calculated using the CKD EPI equation. This calculation has not been validated in all clinical situations. eGFR's persistently <60 mL/min signify possible Chronic Kidney Disease.    Anion gap 6 5 - 15  CBC     Status: Abnormal   Collection Time: 03/20/15  4:52 AM  Result Value Ref Range   WBC 8.5 4.0 - 10.5 K/uL   RBC 3.35 (L) 3.87 - 5.11 MIL/uL   Hemoglobin 10.8 (L) 12.0 - 15.0 g/dL   HCT 32.3 (L) 36.0 - 46.0 %   MCV 96.4 78.0 - 100.0 fL   MCH 32.2 26.0 - 34.0 pg   MCHC 33.4 30.0 - 36.0 g/dL   RDW 14.3 11.5 - 15.5 %   Platelets 103 (L) 150 - 400 K/uL    Comment: CONSISTENT WITH PREVIOUS RESULT  Basic metabolic panel     Status: Abnormal   Collection Time: 03/20/15  4:52 AM  Result Value Ref Range   Sodium 137 135 - 145 mmol/L   Potassium 2.9 (L) 3.5 - 5.1 mmol/L    Comment: DELTA CHECK NOTED   Chloride 104 101 - 111 mmol/L   CO2 27 22 - 32 mmol/L   Glucose, Bld 144 (H) 65 - 99 mg/dL   BUN 18 6 - 20 mg/dL   Creatinine, Ser 0.84 0.44 - 1.00 mg/dL   Calcium 7.8 (L) 8.9 - 10.3 mg/dL   GFR calc non Af Amer >60 >60 mL/min   GFR calc Af Amer >60 >60 mL/min    Comment: (NOTE) The eGFR has been calculated using the CKD EPI equation. This calculation has not been validated in all clinical situations. eGFR's persistently <60 mL/min signify possible Chronic Kidney Disease.    Anion gap 6 5 - 15  Culture, blood (routine x 2)     Status: None  (Preliminary result)   Collection Time: 03/20/15 11:25 AM  Result Value Ref Range  Specimen Description BLOOD LEFT HAND    Special Requests IN PEDIATRIC BOTTLE 2CC    Culture PENDING    Report Status PENDING   Culture, blood (routine x 2)     Status: None (Preliminary result)   Collection Time: 03/20/15 11:30 AM  Result Value Ref Range   Specimen Description BLOOD LEFT ARM    Special Requests BOTTLES DRAWN AEROBIC AND ANAEROBIC 5CC    Culture PENDING    Report Status PENDING    '@BRIEFLABTABLE'$ (sdes,specrequest,cult,reptstatus)   ) Recent Results (from the past 720 hour(s))  Urine Culture     Status: None   Collection Time: 03/02/15  4:45 PM  Result Value Ref Range Status   Colony Count NO GROWTH  Final   Organism ID, Bacteria NO GROWTH  Final  Urine culture     Status: None   Collection Time: 03/16/15  2:32 PM  Result Value Ref Range Status   Specimen Description URINE, CATHETERIZED  Final   Special Requests NONE  Final   Culture   Final    20,000 COLONIES/mL YEAST Performed at [facility]    Report Status 03/18/2015 FINAL  Final  Blood culture (routine x 2)     Status: None (Preliminary result)   Collection Time: 03/16/15  5:15 PM  Result Value Ref Range Status   Specimen Description BLOOD LEFT HAND  Final   Special Requests IN PEDIATRIC BOTTLE .5CC  Final   Culture   Final    NO GROWTH 4 DAYS Performed at [facility]    Report Status PENDING  Incomplete  Blood culture (routine x 2)     Status: None (Preliminary result)   Collection Time: 03/17/15  5:15 AM  Result Value Ref Range Status   Specimen Description BLOOD LEFT ARM  Final   Special Requests BOTTLES DRAWN AEROBIC AND ANAEROBIC 5CC  Final   Culture  Setup Time   Final    YEAST HYPHAL ELEMENTS SEEN AEROBIC BOTTLE ONLY CRITICAL RESULT CALLED TO, READ BACK BY AND VERIFIED WITH: L [id number]$  03/19/15 MKELLY    Culture YEAST Performed at [facility]   Final   Report Status PENDING   Incomplete  Urine culture     Status: None   Collection Time: 03/18/15  9:02 AM  Result Value Ref Range Status   Specimen Description URINE, CATHETERIZED  Final   Special Requests NEPHROSTOMY  Final   Culture   Final    >=100,000 COLONIES/mL YEAST Performed at [facility]    Report Status 03/19/2015 FINAL  Final  Culture, blood (routine x 2)     Status: None (Preliminary result)   Collection Time: 03/20/15 11:25 AM  Result Value Ref Range Status   Specimen Description BLOOD LEFT HAND  Final   Special Requests IN PEDIATRIC BOTTLE 2CC  Final   Culture PENDING  Incomplete   Report Status PENDING  Incomplete  Culture, blood (routine x 2)     Status: None (Preliminary result)   Collection Time: 03/20/15 11:30 AM  Result Value Ref Range Status   Specimen Description BLOOD LEFT ARM  Final   Special Requests BOTTLES DRAWN AEROBIC AND ANAEROBIC 5CC  Final   Culture PENDING  Incomplete   Report Status PENDING  Incomplete     Impression/Recommendation  Principal Problem:   Pyelonephritis Active Problems:   Decubitus ulcer of sacral region, stage 1   Major neurocognitive disorder, due to alzheimer's disease, with behavioral disturbance, mild   UTI (lower urinary tract  infection)   Acute encephalopathy   Left nephrolithiasis   Hydronephrosis of left kidney   Acute kidney injury   Cerebrovascular disease   Thrombocytopenia   Normocytic anemia   Fungemia   [name] is a 81 y.o. female with fungemia secondary to fungal UTI  With kidney stone  #1. Fungemia: Likely candidemia  I am changing to an echinocandin per IDSA guidelines Repeat blood cultures Per IDSA guidelines she should have a formal optho consult for fundoscopic dilated eye exam  She will need minimum of 2 weeks of antibiotics  Hopefully this turns out to be a species that will have high likelihood of being azole S and we can switch back to fluconazole  Given persistent stone, may consider giving  azole UNTIL stone can be broken up   03/20/2015, 6:17 PM   Thank you so much for this interesting consult  [place] [facility] [phone] (pager) [phone] (office) 03/20/2015, 6:17 PM  Rhina Brack[place] 03/20/2015, 6:17 PM

## 2015-03-21 ENCOUNTER — Encounter: Payer: Self-pay | Admitting: Ophthalmology

## 2015-03-21 DIAGNOSIS — B379 Candidiasis, unspecified: Secondary | ICD-10-CM

## 2015-03-21 LAB — CULTURE, BLOOD (ROUTINE X 2): CULTURE: NO GROWTH

## 2015-03-21 LAB — CBC
HEMATOCRIT: 34.4 % — AB (ref 36.0–46.0)
Hemoglobin: 11.2 g/dL — ABNORMAL LOW (ref 12.0–15.0)
MCH: 31.5 pg (ref 26.0–34.0)
MCHC: 32.6 g/dL (ref 30.0–36.0)
MCV: 96.9 fL (ref 78.0–100.0)
PLATELETS: 116 10*3/uL — AB (ref 150–400)
RBC: 3.55 MIL/uL — ABNORMAL LOW (ref 3.87–5.11)
RDW: 14.4 % (ref 11.5–15.5)
WBC: 9.9 10*3/uL (ref 4.0–10.5)

## 2015-03-21 LAB — BASIC METABOLIC PANEL
Anion gap: 6 (ref 5–15)
BUN: 16 mg/dL (ref 6–20)
CO2: 30 mmol/L (ref 22–32)
CREATININE: 0.7 mg/dL (ref 0.44–1.00)
Calcium: 8.3 mg/dL — ABNORMAL LOW (ref 8.9–10.3)
Chloride: 105 mmol/L (ref 101–111)
GFR calc Af Amer: >60 mL/min (ref >60)
Glucose, Bld: 117 mg/dL — ABNORMAL HIGH (ref 65–99)
POTASSIUM: 3.7 mmol/L (ref 3.5–5.1)
SODIUM: 141 mmol/L (ref 135–145)

## 2015-03-21 MED ORDER — FLUCONAZOLE IN SODIUM CHLORIDE 400-0.9 MG/200ML-% IV SOLN
400.0000 mg | Freq: Once | INTRAVENOUS | Status: DC
  Filled 2015-03-21: qty 200

## 2015-03-21 MED ORDER — BISACODYL 10 MG RE SUPP
10.0000 mg | Freq: Once | RECTAL | Status: AC
  Administered 2015-03-21: 10 mg via RECTAL
  Filled 2015-03-21: qty 1

## 2015-03-21 MED ORDER — FLUCONAZOLE IN SODIUM CHLORIDE 200-0.9 MG/100ML-% IV SOLN: 200.0000 mg | INTRAVENOUS | Status: DC

## 2015-03-21 NOTE — Progress Notes (Signed)
[facility]    Subjective: C/o constipation  Antibiotics:  Anti-infectives    Start     Dose/Rate Route Frequency Ordered Stop   03/21/15 1800  anidulafungin (ERAXIS) 100 mg in sodium chloride 0.9 % 100 mL IVPB  Status:  Discontinued     100 mg over 90 Minutes Intravenous Every 24 hours 03/20/15 1634 03/21/15 1746   03/20/15 1800  anidulafungin (ERAXIS) 200 mg in sodium chloride 0.9 % 200 mL IVPB     200 mg over 180 Minutes Intravenous  Once 03/20/15 1635 03/20/15 2035   03/20/15 1645  anidulafungin (ERAXIS) 200 mg in sodium chloride 0.9 % 200 mL IVPB  Status:  Discontinued     200 mg over 180 Minutes Intravenous Every 24 hours 03/20/15 1634 03/20/15 1635   03/20/15 0800  fluconazole (DIFLUCAN) IVPB 200 mg  Status:  Discontinued     200 mg 100 mL/hr over 60 Minutes Intravenous Every 24 hours 03/19/15 0651 03/20/15 1634   03/19/15 0700  fluconazole (DIFLUCAN) IVPB 400 mg     400 mg 100 mL/hr over 120 Minutes Intravenous  Once 03/19/15 0651 03/19/15 0947   03/17/15 1700  cefTRIAXone (ROCEPHIN) 1 g in dextrose 5 % 50 mL IVPB  Status:  Discontinued     1 g 100 mL/hr over 30 Minutes Intravenous Every 24 hours 03/16/15 2118 03/20/15 1633   03/16/15 1615  cefTRIAXone (ROCEPHIN) 1 g in dextrose 5 % 50 mL IVPB     1 g 100 mL/hr over 30 Minutes Intravenous  Once 03/16/15 1600 03/16/15 1815      Medications: Scheduled Meds: . aspirin  325 mg Oral Daily  . citalopram  20 mg Oral Daily  . donepezil  10 mg Oral Daily  . feeding supplement (ENSURE ENLIVE)  237 mL Oral BID BM  . memantine  28 mg Oral Daily  . oxybutynin  5 mg Oral TID  . pantoprazole  40 mg Oral QAC breakfast  . polyethylene glycol  17 g Oral Daily  . sodium chloride  3 mL Intravenous Q12H   Continuous Infusions: . 0.9 % NaCl with KCl 40 mEq / L 75 mL/hr (03/21/15 0315)   PRN Meds:.acetaminophen **OR** acetaminophen, ALPRAZolam, iohexol, levalbuterol, ondansetron  **OR** ondansetron (ZOFRAN) IV    Objective: Weight change:   Intake/Output Summary (Last 24 hours) at 03/21/15 1746 Last data filed at 03/21/15 1736  Gross per 24 hour  Intake 2001.25 ml  Output   2350 ml  Net -348.75 ml   Blood pressure 154/56, pulse 65, temperature 98.7 F (37.1 C), temperature source Oral, resp. rate 18, height 4' 11.5" (1.511 m), weight 160 lb 11.2 oz (72.893 kg), SpO2 98 %. Temp:  [98.7 F (37.1 C)-99.1 F (37.3 C)] 98.7 F (37.1 C) (09/05 1504) Pulse Rate:  [62-65] 65 (09/05 1504) Resp:  [18-20] 18 (09/05 1504) BP: (142-160)/(56-62) 154/56 mmHg (09/05 1504) SpO2:  [97 %-98 %] 98 % (09/05 1504)  Physical Exam: General: Alert and awake,  HEENT: anicteric sclera, EOMI, oropharynx clear and without exudate CVS regular rate, normal r, no murmur rubs or gallops Chest: clear to auscultation bilaterally, no wheezing, rales or rhonchi Abdomen: soft nontender, nondistended, normal bowel sounds, Extremities: no clubbing or edema noted bilaterally Skin: no rashes Neuro: nonfocal  CBC: CBC Latest Ref Rng 03/21/2015 03/20/2015 03/18/2015  WBC 4.0 - 10.5 K/uL 9.9 8.5 8.3  Hemoglobin 12.0 - 15.0 g/dL 11.2(L) 10.8(L)  12.1  Hematocrit 36.0 - 46.0 % 34.4(L) 32.3(L) 36.0  Platelets 150 - 400 K/uL 116(L) 103(L) 99(L)       BMET  Recent Labs  03/20/15 0452 03/21/15 0509  NA 137 141  K 2.9* 3.7  CL 104 105  CO2 27 30  GLUCOSE 144* 117*  BUN 18 16  CREATININE 0.84 0.70  CALCIUM 7.8* 8.3*     Liver Panel  No results for input(s): PROT, ALBUMIN, AST, ALT, ALKPHOS, BILITOT, BILIDIR, IBILI in the last 72 hours.     Sedimentation Rate No results for input(s): ESRSEDRATE in the last 72 hours. C-Reactive Protein No results for input(s): CRP in the last 72 hours.  Micro Results: Recent Results (from the past 720 hour(s))  Urine Culture     Status: None   Collection Time: 03/02/15  4:45 PM  Result Value Ref Range Status   Colony Count NO GROWTH   Final   Organism ID, Bacteria NO GROWTH  Final  Urine culture     Status: None   Collection Time: 03/16/15  2:32 PM  Result Value Ref Range Status   Specimen Description URINE, CATHETERIZED  Final   Special Requests NONE  Final   Culture   Final    20,000 COLONIES/mL YEAST Performed at [facility]    Report Status 03/18/2015 FINAL  Final  Blood culture (routine x 2)     Status: None   Collection Time: 03/16/15  5:15 PM  Result Value Ref Range Status   Specimen Description BLOOD LEFT HAND  Final   Special Requests IN PEDIATRIC BOTTLE .5CC  Final   Culture   Final    NO GROWTH 5 DAYS Performed at [facility]    Report Status 03/21/2015 FINAL  Final  Blood culture (routine x 2)     Status: None   Collection Time: 03/17/15  5:15 AM  Result Value Ref Range Status   Specimen Description BLOOD LEFT ARM  Final   Special Requests BOTTLES DRAWN AEROBIC AND ANAEROBIC 5CC  Final   Culture  Setup Time   Final    YEAST HYPHAL ELEMENTS SEEN AEROBIC BOTTLE ONLY CRITICAL RESULT CALLED TO, READ BACK BY AND VERIFIED WITH: L HUDSON@0613  03/19/15 MKELLY    Culture   Final    CANDIDA ALBICANS Performed at [facility]    Report Status 03/21/2015 FINAL  Final  Urine culture     Status: None   Collection Time: 03/18/15  9:02 AM  Result Value Ref Range Status   Specimen Description URINE, CATHETERIZED  Final   Special Requests NEPHROSTOMY  Final   Culture   Final    >=100,000 COLONIES/mL YEAST Performed at [facility]    Report Status 03/19/2015 FINAL  Final  Culture, blood (routine x 2)     Status: None (Preliminary result)   Collection Time: 03/20/15 11:25 AM  Result Value Ref Range Status   Specimen Description BLOOD LEFT HAND  Final   Special Requests IN PEDIATRIC BOTTLE 2CC  Final   Culture   Final    NO GROWTH < 24 HOURS Performed at [facility]    Report Status PENDING  Incomplete  Culture, blood (routine x 2)     Status: None (Preliminary  result)   Collection Time: 03/20/15 11:30 AM  Result Value Ref Range Status   Specimen Description BLOOD LEFT ARM  Final   Special Requests BOTTLES DRAWN AEROBIC AND ANAEROBIC 5CC  Final   Culture  Final    NO GROWTH < 24 HOURS Performed at [facility]    Report Status PENDING  Incomplete    Studies/Results: No results found.    Assessment/Plan:  INTERVAL HISTORY:   03/21/15: candida speciated as albicans   Principal Problem:   Pyelonephritis Active Problems:   Decubitus ulcer of sacral region, stage 1   Major neurocognitive disorder, due to alzheimer's disease, with behavioral disturbance, mild   UTI (lower urinary tract infection)   Acute encephalopathy   Left nephrolithiasis   Hydronephrosis of left kidney   Acute kidney injury   Cerebrovascular disease   Thrombocytopenia   Normocytic anemia   Fungemia   Recurrent UTI   Candidemia    [name] is a 81 y.o. female with  candidemia secondary to candida UTI With kidney stone  #1 Candidemia secondary to obstructive stone. Species is candida albicans  --switch back to IV fluconazole and tomorrow change to po fluconazole --ophtho to evaluate for fungal endophthalmitis --will need minimum of 2 weeks of antifungal therapy from date of first negative blood cultures --I would consider extending her fluconazole thru her lithotripsy treatment  Dr. Megan Salon to see the patient tomorrow.   LOS: 5 days   Alcide Evener 03/21/2015, 5:46 PM

## 2015-03-21 NOTE — Progress Notes (Signed)
The dose of Eraxis was hung by the day RN for the 1800 dose today. All of the dose was given.

## 2015-03-21 NOTE — Progress Notes (Signed)
ANTIBIOTIC CONSULT NOTE - FOLLOW UP  Pharmacy Consult for Fluconazole IV Indication: candidemia  Allergies  Allergen Reactions  . Penicillins     REACTION: adolesent    Patient Measurements: Height: 4' 11.5" (151.1 cm) Weight: 160 lb 11.2 oz (72.893 kg) IBW/kg (Calculated) : 44.35  Vital Signs: Temp: 98.7 F (37.1 C) (09/05 1504) Temp Source: Oral (09/05 1504) BP: 154/56 mmHg (09/05 1504) Pulse Rate: 65 (09/05 1504) Intake/Output from previous day: 09/04 0701 - 09/05 0700 In: 1761.3 [P.O.:120; I.V.:1381.3; IV Piggyback:260] Out: 1950 [ENIDP:8242] Intake/Output from this shift: Total I/O In: 240 [P.O.:240] Out: 1000 [Urine:1000]  Labs:  Recent Labs  03/19/15 1025 03/20/15 0452 03/21/15 0509  WBC  --  8.5 9.9  HGB  --  10.8* 11.2*  PLT  --  103* 116*  CREATININE 1.09* 0.84 0.70   Estimated Creatinine Clearance: 47.8 mL/min (by C-G formula based on Cr of 0.7). No results for input(s): VANCOTROUGH, VANCOPEAK, VANCORANDOM, GENTTROUGH, GENTPEAK, GENTRANDOM, TOBRATROUGH, TOBRAPEAK, TOBRARND, AMIKACINPEAK, AMIKACINTROU, AMIKACIN in the last 72 hours.   Microbiology: Recent Results (from the past 720 hour(s))  Urine Culture     Status: None   Collection Time: 03/02/15  4:45 PM  Result Value Ref Range Status   Colony Count NO GROWTH  Final   Organism ID, Bacteria NO GROWTH  Final  Urine culture     Status: None   Collection Time: 03/16/15  2:32 PM  Result Value Ref Range Status   Specimen Description URINE, CATHETERIZED  Final   Special Requests NONE  Final   Culture   Final    20,000 COLONIES/mL YEAST Performed at [facility]    Report Status 03/18/2015 FINAL  Final  Blood culture (routine x 2)     Status: None   Collection Time: 03/16/15  5:15 PM  Result Value Ref Range Status   Specimen Description BLOOD LEFT HAND  Final   Special Requests IN PEDIATRIC BOTTLE .5CC  Final   Culture   Final    NO GROWTH 5 DAYS Performed at [facility]    Report Status 03/21/2015 FINAL  Final  Blood culture (routine x 2)     Status: None   Collection Time: 03/17/15  5:15 AM  Result Value Ref Range Status   Specimen Description BLOOD LEFT ARM  Final   Special Requests BOTTLES DRAWN AEROBIC AND ANAEROBIC 5CC  Final   Culture  Setup Time   Final    YEAST HYPHAL ELEMENTS SEEN AEROBIC BOTTLE ONLY CRITICAL RESULT CALLED TO, READ BACK BY AND VERIFIED WITH: L HUDSON@0613  03/19/15 MKELLY    Culture   Final    CANDIDA ALBICANS Performed at [facility]    Report Status 03/21/2015 FINAL  Final  Urine culture     Status: None   Collection Time: 03/18/15  9:02 AM  Result Value Ref Range Status   Specimen Description URINE, CATHETERIZED  Final   Special Requests NEPHROSTOMY  Final   Culture   Final    >=100,000 COLONIES/mL YEAST Performed at [facility]    Report Status 03/19/2015 FINAL  Final  Culture, blood (routine x 2)     Status: None (Preliminary result)   Collection Time: 03/20/15 11:25 AM  Result Value Ref Range Status   Specimen Description BLOOD LEFT HAND  Final   Special Requests IN PEDIATRIC BOTTLE 2CC  Final   Culture   Final    NO GROWTH < 24 HOURS Performed at [facility]  Report Status PENDING  Incomplete  Culture, blood (routine x 2)     Status: None (Preliminary result)   Collection Time: 03/20/15 11:30 AM  Result Value Ref Range Status   Specimen Description BLOOD LEFT ARM  Final   Special Requests BOTTLES DRAWN AEROBIC AND ANAEROBIC 5CC  Final   Culture   Final    NO GROWTH < 24 HOURS Performed at [facility]    Report Status PENDING  Incomplete    Anti-infectives    Start     Dose/Rate Route Frequency Ordered Stop   03/21/15 1800  anidulafungin (ERAXIS) 100 mg in sodium chloride 0.9 % 100 mL IVPB  Status:  Discontinued     100 mg over 90 Minutes Intravenous Every 24 hours 03/20/15 1634 03/21/15 1746   03/20/15 1800  anidulafungin (ERAXIS) 200 mg in sodium chloride 0.9 % 200  mL IVPB     200 mg over 180 Minutes Intravenous  Once 03/20/15 1635 03/20/15 2035   03/20/15 1645  anidulafungin (ERAXIS) 200 mg in sodium chloride 0.9 % 200 mL IVPB  Status:  Discontinued     200 mg over 180 Minutes Intravenous Every 24 hours 03/20/15 1634 03/20/15 1635   03/20/15 0800  fluconazole (DIFLUCAN) IVPB 200 mg  Status:  Discontinued     200 mg 100 mL/hr over 60 Minutes Intravenous Every 24 hours 03/19/15 0651 03/20/15 1634   03/19/15 0700  fluconazole (DIFLUCAN) IVPB 400 mg     400 mg 100 mL/hr over 120 Minutes Intravenous  Once 03/19/15 0651 03/19/15 0947   03/17/15 1700  cefTRIAXone (ROCEPHIN) 1 g in dextrose 5 % 50 mL IVPB  Status:  Discontinued     1 g 100 mL/hr over 30 Minutes Intravenous Every 24 hours 03/16/15 2118 03/20/15 1633   03/16/15 1615  cefTRIAXone (ROCEPHIN) 1 g in dextrose 5 % 50 mL IVPB     1 g 100 mL/hr over 30 Minutes Intravenous  Once 03/16/15 1600 03/16/15 1815     Assessment: 81 y.o. female with fungemia d/t fungal pyelonephritis from obstructing kidney stone, known from previous fluconazole dosing this admission.  ID had briefly switched patient to echinocandin pending sensitivities; now returning to fluconazole with speciation of C albicans.   Note that Eraxis dose for 9/5 was not charted as order was d/c'd just before time dose was due, but per RN Eraxis dose had in fact already been hung around 6p on 9/5.  Temp: afebrile WBC: wnl Renal: SCr wnl; CrCl 48 CG  8/31 >> CTX >> 9/2 >> Fluconazole >> 9/4; resumed 9/6 9/4 >> Anidulafungin MD >> 9/5  8/31 blood: NGF 8/31 urine: 20,000 colonies/mL yeast 9/1 blood: candida albicans FINAL 9/2 urine (nephrostomy): >= 100,000 colonies/mL yeast 9/4 blood x 2: ngtd  Dose changes/levels:   Goal of Therapy:  Eradication of infection Appropriate antibiotic dosing for indication and renal function  Plan:  Day 4 antifungals (day 7 antibiotics)  At 1800 tomorrow, resume fluconazole at previous  dosing: 400 mg IV x 1, then 200 mg daily.  Anticipate conversion to PO soon per ID  Note patient also on citalopram; last QTc from tele strip on 9/3 wnl.  Continue to monitor for any undue QT prolongation.  Follow clinical course, renal function  Plan for 2 weeks treatment minimum per ID (on active tx since 9/2)   Reuel Boom, PharmD, BCPS Pager: [phone] 03/21/2015, 7:39 PM

## 2015-03-21 NOTE — Progress Notes (Signed)
Physical Therapy Treatment Patient Details Name: [name] MRN: [id number] DOB: 28-May-1931 Today's Date: 03/21/2015    History of Present Illness 81 y.o. female with past medical history significant for obesity, depression, anxiety, Alzheimer's dementia, GERD, sacral decubitus ulcer, diverticular disease, recurrent UTIs was admitted to the hospital on 8/31 with diminished oral intake, lethargy/diminished activity level, and flank pain.  Pt with presumed left pyelonephritis and 2 cm left ureteropelvic junction calculus/moderate hydronephrosis and s/p left percutaneous nephrostomy 9/2 by IR.     PT Comments    Progressing, incr gait distance today; pt cooperative with verbal encouragement  Follow Up Recommendations  SNF;Supervision/Assistance - 24 hour     Equipment Recommendations  None recommended by PT    Recommendations for Other Services       Precautions / Restrictions Precautions Precautions: Fall    Mobility  Bed Mobility Overal bed mobility: Needs Assistance;+2 for physical assistance Bed Mobility: Supine to Sit     Supine to sit: Mod assist;HOB elevated;+2 for safety/equipment     General bed mobility comments: multimodal cues for technique, assist for scooting to EOB and trunk upright, assist for LEs off bed  Transfers Overall transfer level: Needs assistance Equipment used: 4-wheeled walker Transfers: Sit to/from Stand Sit to Stand: Mod assist;+2 physical assistance         General transfer comment: verbal cues for safe technique, assist to rise and steady; family very eager to  assist  Ambulation/Gait Ambulation/Gait assistance: Min assist;+2 physical assistance;+2 safety/equipment Ambulation Distance (Feet): 35 Feet Assistive device: 4-wheeled walker Gait Pattern/deviations: Step-through pattern;Decreased stride length;Trunk flexed Gait velocity: decreased   General Gait Details: distance limited by pt (likely cognition), fatigues quickly,  assist for steadying; pt adamant that she not walk any further, pt allowed to sit   Stairs            Wheelchair Mobility    Modified Rankin (Stroke Patients Only)       Balance Overall balance assessment: Needs assistance           Standing balance-Leahy Scale: Poor                      Cognition Arousal/Alertness: Awake/alert Behavior During Therapy: Flat affect Overall Cognitive Status: Impaired/Different from baseline Area of Impairment: Orientation;Following commands;Safety/judgement Orientation Level: Disoriented to;Situation;Time;Place   Memory: Decreased recall of precautions;Decreased short-term memory Following Commands: Follows one step commands inconsistently Safety/Judgement: Decreased awareness of safety;Decreased awareness of deficits     General Comments: dementia per family (hx Alzheimers), family reports not at baseline cognition    Exercises      General Comments        Pertinent Vitals/Pain Pain Assessment: No/denies pain    Home Living                      Prior Function            PT Goals (current goals can now be found in the care plan section) Acute Rehab PT Goals Patient Stated Goal: family would like rehab to assist pt back to her baseline prior to home PT Goal Formulation: With patient/family Time For Goal Achievement: 04/01/15 Potential to Achieve Goals: Good Progress towards PT goals: Progressing toward goals    Frequency  Min 3X/week    PT Plan Current plan remains appropriate    Co-evaluation             End of Session Equipment Utilized During Treatment: Gait belt  Activity Tolerance: Patient limited by fatigue Patient left: in chair;with call bell/phone within reach;with nursing/sitter in room [facility] with pt until family returns)     Time: 1121-1140 PT Time Calculation (min) (ACUTE ONLY): 19 min  Charges:  $Gait Training: 8-22 mins                    G Codes:       Brynden Thune Apr 11, 2015, 1:20 PM

## 2015-03-21 NOTE — Progress Notes (Signed)
Nutrition Follow-up  DOCUMENTATION CODES:   Obesity unspecified  INTERVENTION:   - Continue Ensure Enlive BID, each supplement provides 350 kcal and 20 grams of protein - RD will continue to monitor for needs  NUTRITION DIAGNOSIS:   Inadequate oral intake related to poor appetite as evidenced by per patient/family report.  Ongoing.  GOAL:   Patient will meet greater than or equal to 90% of their needs  Not meeting.  MONITOR:   PO intake, Supplement acceptance, Labs, I & O's  REASON FOR ASSESSMENT:   Consult Assessment of nutrition requirement/status  ASSESSMENT:   81 yr old female with Depression,anxiety and obesity who presents with lethargy, poor oral intake .She also has had right flank pain, She is brought today by her husband. Her husband is her primary care giver. He is on home oxygen and is not in great health. Husband reports that for the past 7 days, she has been sleeping more and not doing her regular a54 yr old female with Depression,anxiety and obesity who presents with lethargy, poor oral intake .She also has had right flank pain, She is brought today by her husband. Her husband is her primary care giver. He is on home oxygen and is not in great health. Husband reports that for the past 7 days, she has been sleeping more and not doing her regular activities (patient usually enjoys coloring but has not colored over the last 2 days). He reports that while she is taking in PO's, her PO intake has been poor. Patient has been sleeping more than usual and staying in the bed most of the day. Only has been getting up to use the bathroom. ctivities (patient usually enjoys coloring but has not colored over the last 2 days). He reports that while she is taking in PO's, her PO intake has been poor. Patient has been sleeping more than usual and staying in the bed most of the day. Only has been getting up to use the bathroom.    RD spoke with pt's daughter. Per  daughter, pt is still not eating much but is drinking Ensures and anything that is sweet. Pt's daughter concerned because pt does not want anything but sweets and carbohydrates.  Encouraged pt's daughter to keep offering fluids to patient, encourage movement. Provided ways to increase protein consumption.  Labs reviewed.  Diet Order:  Diet Heart Room service appropriate?: Yes; Fluid consistency:: Thin  Skin:  Wound (see comment) (Stage 1 sacral pressure ulcer)  Last BM:  9/2  Height:   Ht Readings from Last 1 Encounters:  03/16/15 4' 11.5" (1.511 m)    Weight:   Wt Readings from Last 1 Encounters:  03/16/15 160 lb 11.2 oz (72.893 kg)    Ideal Body Weight:  44.1 kg (kg)  BMI:  Body mass index is 31.93 kg/(m^2).  Estimated Nutritional Needs:   Kcal:  9326-7124  Protein:  52-62 grams  Fluid:  2-2.2 L/day  EDUCATION NEEDS:   Education needs addressed  Clayton Bibles, MS, RD, LDN Pager: [phone] After Hours Pager: [phone]

## 2015-03-21 NOTE — Progress Notes (Unsigned)
Ophthalmology consult to r/o ophthalmic fungal seeding with fungemia.  Pt's pertinent history reviewed. Pt a limited historian due to hearing loss and dementia. Daughter accompanies the pt and notes no sig ophthalmic hx except for cataract sx ou.  VA: unable due to poor cooperation.  IOP 16,12 EOM: full ou CVF: full ou Pupils: PERRL, no apd  L/L: dermatochalasis ou Conj: w+q ou K: clear ou Ac: D+Q ou Iris: wnl ou Lens: pciol ou Vit: clear ou  DFE:  Optic nerve s+p Mac: wnl ou Vessels; wnl ou Periphery: flat x 4 ou  A/P: No evidence of ophthalmic fungal seeding.   FU prn.

## 2015-03-21 NOTE — Progress Notes (Signed)
   Filed Vitals:   03/21/15 0437  BP: 160/60  Pulse: 65  Temp: 99.1 F (37.3 C)  Resp: 18     Intake/Output Summary (Last 24 hours) at 03/21/15 1031 Last data filed at 03/21/15 0659  Gross per 24 hour  Intake 1761.25 ml  Output   1350 ml  Net 411.25 ml    PE: NAD Looks well Left Nx intact. Clear UOP. Site looks clean (daughter rolled her up for me to examine).    A/P - sepsis with large left UPJ stone s/p left Perc Nx. Urine Cx growing yeast. Abx changed to Eraxis. F/u with Dr. Pilar Jarvis to plan left PCNL. Will follow.

## 2015-03-21 NOTE — Progress Notes (Addendum)
Progress Note   [name] ZOX:[id number] DOB: 25-Feb-1931 DOA: 03/16/2015 PCP: Nance Pear., NP   Brief Narrative:   [name] is an 81 y.o. female the PMH of chronic respiratory failure on home oxygen, recurrent UTI on chronic prophylactic antibiotics therapy with Macrodantin, depression/anxiety and obesity who was admitted on 03/16/15 with altered mental status and malaise with decreased oral intake. On initial evaluation in the ER, the patient was found to have a UTI with possible pyelonephritis and acute kidney injury with creatinine of 1.67.  Assessment/Plan:   Principal Problem:   Pyelonephritis/lower UTI with left nephrolithiasis and hydronephrosis/fungemia - Initially treated with empiric Rocephin and Diflucan, urine cultures positive for yeast.  - Given yeast in blood and urine cultures, Diflucan changed to Eraxis. - Repeat blood cultures done. - Status post percutaneous nephrostomy tube by IR. - Outpatient follow-up with urology for definitive stone management. - She will need a formal dilated eye exam.  Consulted Dr. Maylene Roes who will see the patient in consultation.  Active Problems:   Normocytic anemia - Likely secondary to anemia of chronic disease. Drop in hemoglobin likely dilutional.    Hypokalemia - Replete.    Acute kidney injury - Secondary to obstructing nephrolithiasis, hydronephrosis, and decreased oral intake. - Baseline creatinine is 1.01. Creatinine now back to baseline values.    Decubitus ulcer of sacral region, stage 1 - Skin care per nursing.    Major neurocognitive disorder, due to alzheimer's disease, with behavioral disturbance, mild/CVD - Had worsening of cognitive status on admission. Slowly improving. - CT of the head negative for acute findings, but showed atrophy and chronic cerebrovascular disease. - Continue Aricept and Namenda.    Acute encephalopathy - Secondary to acute infection, management as above.   Thrombocytopenia  - Discontinue heparin.  - Platelets were normal 03/16/15. Platelet count 99---> 103--->116 after stopping heparin.    DVT Prophylaxis - Heparin discontinued secondary to thrombocytopenia. Continue SCDs.  Family Communication: Daughters updated at bedside. Disposition Plan: Likely will need a SNF for rehab, lives with husband. Can be discharged once renal function back to baseline, encephalopathy improved and cultures back, likely another 1-2 days. Code Status:     Code Status Orders        Start     Ordered   03/16/15 1758  Full code   Continuous     03/16/15 1758    Advance Directive Documentation        Most Recent Value   Type of Advance Directive  Healthcare Power of Attorney, Out of facility DNR (pink MOST or yellow form)   Pre-existing out of facility DNR order (yellow form or pink MOST form)  -- [family member did not bring DNR paperwork]   "MOST" Form in Place?          IV Access:    Peripheral IV   Procedures and diagnostic studies:   Ir Nephrostomy Placement Left  03/18/2015   CLINICAL DATA:  Left hydronephrosis, obstructing left UPJ calculus.  EXAM: ULTRASOUND GUIDANCE FOR NEPHROSTOMY ACCESS  TEN FRENCH LEFT NEPHROSTOMY UNDER FLUOROSCOPY  Date:  9/2/20169/08/2014 9:23 am  Radiologist:  M. Daryll Brod, MD  Guidance:  Ultrasound and fluoroscopic  FLUOROSCOPY TIME:  1 minutes 24 seconds, 19.9 mGy  MEDICATIONS AND MEDICAL HISTORY: 0.5 mg Versed, 25 mcg fentanyl, patient is already receiving IV Rocephin.  ANESTHESIA/SEDATION: 8 minutes  CONTRAST:  53mL OMNIPAQUE IOHEXOL 300 MG/ML  SOLN  COMPLICATIONS: None immediate  PROCEDURE: Informed consent was  obtained from the patient following explanation of the procedure, risks, benefits and alternatives. The patient understands, agrees and consents for the procedure. All questions were addressed. A time out was performed.  Maximal barrier sterile technique utilized including caps, mask, sterile gowns, sterile  gloves, large sterile drape, hand hygiene, and Betadine.  Previous imaging reviewed. Patient positioned prone. Preliminary ultrasound demonstrated hydronephrosis of the left kidney. Under sterile conditions and local anesthesia, a dilated lower pole calyx was accessed percutaneously. This was performed with ultrasound. Images obtained for documentation. There was return of cloudy urine. Sample sent for Gram stain and culture. Guidewire advanced followed by the Accustick dilator set. Amplatz guidewire exchange performed followed by tract dilatation to insert a 10 [place] drain. Retention loop formed in the collecting system. Contrast injection confirms position. Catheter secured with a Prolene suture and connected to external gravity drainage. No immediate complication. Patient tolerated the procedure well.  IMPRESSION: Successful ultrasound and fluoroscopic left 10 French percutaneous nephrostomy.   Electronically Signed   By: Jerilynn Mages.  Shick M.D.   On: 03/18/2015 11:31     Medical Consultants:    Ileene Musa, [facility]  Nickie Retort, MD, Urology  Truman Hayward, MD, ID   Anti-Infectives:    Rocephin 03/17/15--->  Diflucan 03/19/15---> 03/20/15  Eraxis 03/20/15--->  Subjective:   [name] is more alert per family.  Appetite poor but drinks liquids including Ensure.  BM in the past 2 days.  No nausea or vomiting.  Patient states "I just don't feel well", but no specific complaints.  Objective:    Filed Vitals:   03/20/15 0659 03/20/15 1505 03/20/15 2008 03/21/15 0437  BP: 153/67 145/60 142/62 160/60  Pulse: 77 60 62 65  Temp: 98.5 F (36.9 C) 99.1 F (37.3 C) 98.8 F (37.1 C) 99.1 F (37.3 C)  TempSrc: Oral Oral Oral Oral  Resp: 20 20 20 18   Height:      Weight:      SpO2: 96% 97% 97% 97%    Intake/Output Summary (Last 24 hours) at 03/21/15 0727 Last data filed at 03/21/15 0315  Gross per 24 hour  Intake 1761.25 ml  Output    950 ml  Net  811.25 ml    Exam: Gen:  NAD, more awake today Cardiovascular:  RRR, II/VI murmur Respiratory:  Lungs dimnished Gastrointestinal:  Abdomen soft, NT/ND, + BS Extremities:  No C/E/C, SCDs on   Data Reviewed:    Labs: Basic Metabolic Panel:  Recent Labs Lab 03/16/15 2050 03/17/15 0515 03/18/15 0450 03/19/15 1025 03/20/15 0452 03/21/15 0509  NA  --  139 137 139 137 141  K  --  3.9 3.7 5.0 2.9* 3.7  CL  --  105 105 111 104 105  CO2  --  27 29 22 27 30   GLUCOSE  --  125* 101* 79 144* 117*  BUN  --  17 24* 19 18 16   CREATININE  --  1.54* 1.71* 1.09* 0.84 0.70  CALCIUM  --  8.2* 7.9* 7.8* 7.8* 8.3*  MG 1.8  --   --   --   --   --    GFR Estimated Creatinine Clearance: 47.8 mL/min (by C-G formula based on Cr of 0.7). Liver Function Tests:  Recent Labs Lab 03/16/15 1528 03/17/15 0515  AST 32 36  ALT 13* 20  ALKPHOS 76 67  BILITOT 0.9 0.7  PROT 7.9 6.3*  ALBUMIN 3.4* 2.8*    Recent Labs Lab 03/16/15  1529  AMMONIA 21   Coagulation profile  Recent Labs Lab 03/17/15 1620 03/18/15 0450  INR 1.34 1.40    CBC:  Recent Labs Lab 03/16/15 1528 03/17/15 0515 03/18/15 0450 03/20/15 0452 03/21/15 0509  WBC 15.4* 16.3* 8.3 8.5 9.9  NEUTROABS 12.4*  --   --   --   --   HGB 15.1* 12.9 12.1 10.8* 11.2*  HCT 45.0 38.8 36.0 32.3* 34.4*  MCV 99.8 99.2 98.4 96.4 96.9  PLT 150 124* 99* 103* 116*   Cardiac Enzymes:  Recent Labs Lab 03/16/15 2050 03/17/15 0515 03/17/15 1130  TROPONINI <0.03 0.03 0.04*   BNP (last 3 results)  Recent Labs  07/02/14 1838  PROBNP 470.0*   CBG:  Recent Labs Lab 03/16/15 1532  GLUCAP 131*   Sepsis Labs:  Recent Labs Lab 03/17/15 0515 03/18/15 0450 03/20/15 0452 03/21/15 0509  WBC 16.3* 8.3 8.5 9.9   Microbiology Recent Results (from the past 240 hour(s))  Urine culture     Status: None   Collection Time: 03/16/15  2:32 PM  Result Value Ref Range Status   Specimen Description URINE, CATHETERIZED  Final    Special Requests NONE  Final   Culture   Final    20,000 COLONIES/mL YEAST Performed at [facility]    Report Status 03/18/2015 FINAL  Final  Blood culture (routine x 2)     Status: None (Preliminary result)   Collection Time: 03/16/15  5:15 PM  Result Value Ref Range Status   Specimen Description BLOOD LEFT HAND  Final   Special Requests IN PEDIATRIC BOTTLE .5CC  Final   Culture   Final    NO GROWTH 4 DAYS Performed at [facility]    Report Status PENDING  Incomplete  Blood culture (routine x 2)     Status: None (Preliminary result)   Collection Time: 03/17/15  5:15 AM  Result Value Ref Range Status   Specimen Description BLOOD LEFT ARM  Final   Special Requests BOTTLES DRAWN AEROBIC AND ANAEROBIC 5CC  Final   Culture  Setup Time   Final    YEAST HYPHAL ELEMENTS SEEN AEROBIC BOTTLE ONLY CRITICAL RESULT CALLED TO, READ BACK BY AND VERIFIED WITH: L HUDSON@[id number]  03/19/15 MKELLY    Culture YEAST Performed at [facility]   Final   Report Status PENDING  Incomplete  Urine culture     Status: None   Collection Time: 03/18/15  9:02 AM  Result Value Ref Range Status   Specimen Description URINE, CATHETERIZED  Final   Special Requests NEPHROSTOMY  Final   Culture   Final    >=100,000 COLONIES/mL YEAST Performed at [facility]    Report Status 03/19/2015 FINAL  Final  Culture, blood (routine x 2)     Status: None (Preliminary result)   Collection Time: 03/20/15 11:25 AM  Result Value Ref Range Status   Specimen Description BLOOD LEFT HAND  Final   Special Requests IN PEDIATRIC BOTTLE [facility]  Final   Culture PENDING  Incomplete   Report Status PENDING  Incomplete  Culture, blood (routine x 2)     Status: None (Preliminary result)   Collection Time: 03/20/15 11:30 AM  Result Value Ref Range Status   Specimen Description BLOOD LEFT ARM  Final   Special Requests BOTTLES DRAWN AEROBIC AND ANAEROBIC 5CC  Final   Culture PENDING  Incomplete   Report  Status PENDING  Incomplete     Medications:   . anidulafungin  100 mg Intravenous Q24H  . aspirin  325 mg Oral Daily  . citalopram  20 mg Oral Daily  . donepezil  10 mg Oral Daily  . feeding supplement (ENSURE ENLIVE)  237 mL Oral BID BM  . memantine  28 mg Oral Daily  . oxybutynin  5 mg Oral TID  . pantoprazole  40 mg Oral QAC breakfast  . polyethylene glycol  17 g Oral Daily  . sodium chloride  3 mL Intravenous Q12H   Continuous Infusions: . 0.9 % NaCl with KCl 40 mEq / L 75 mL/hr (03/21/15 0315)    Time spent: 35 minutes with > 50% of time discussing current diagnostic test results, clinical impression and plan of care with patient's daughters at the bedside. I answered all of their questions.   LOS: 5 days   [facility] Pager [phone]. If unable to reach me by pager, please call my cell phone at [phone].  *Please refer to [email or web address], password [email or web address] to get updated schedule on who will round on this patient, as hospitalists switch teams weekly. If 7PM-7AM, please contact night-coverage at [email or web address], password [email or web address] for any overnight needs.  03/21/2015, 7:27 AM

## 2015-03-21 NOTE — Progress Notes (Signed)
Referring Physician(s): [place]  Chief Complaint:  L hydronephrosis Stone/ pyelo  Subjective:  L PCN placed 9/2 Output great 1.3 liters yesterday 200 in bag now Yellow Feeling some better Renal tests better   Allergies: Penicillins  Medications: Prior to Admission medications   Medication Sig Start Date End Date Taking? Authorizing Provider  ALPRAZolam Duanne Moron) 0.25 MG tablet Take one table by mouth 3 times daily as needed for agitation or insomnia. 02/09/15  Yes Debbrah Alar, NP  aspirin 325 MG EC tablet Take 325 mg by mouth daily.     Yes Historical Provider, MD  citalopram (CELEXA) 20 MG tablet Take 1 tablet (20 mg total) by mouth daily. 02/09/15  Yes Debbrah Alar, NP  Memantine HCl-Donepezil HCl (NAMZARIC) 28-10 MG CP24 Take 1 tablet by mouth daily. 12/16/14  Yes Carmen Dohmeier, MD  mometasone (NASONEX) 50 MCG/ACT nasal spray Place 1-2 sprays into the nose daily.   Yes Historical Provider, MD  Multiple Vitamin (MULTIVITAMIN WITH MINERALS) TABS tablet Take 1 tablet by mouth daily.   Yes Historical Provider, MD  nitrofurantoin (MACRODANTIN) 100 MG capsule Take 1 capsule (100 mg total) by mouth daily. 02/09/15  Yes Debbrah Alar, NP  OVER THE COUNTER MEDICATION Azo flush  'cranberry medication' reported by patient's husband. Take one daily   Yes Historical Provider, MD  oxybutynin (DITROPAN) 5 MG tablet Take 1 tablet (5 mg total) by mouth 3 (three) times daily. 02/09/15  Yes Debbrah Alar, NP  pantoprazole (PROTONIX) 40 MG tablet Take 1 tablet (40 mg total) by mouth daily before breakfast. 02/09/15  Yes Debbrah Alar, NP  potassium chloride SA (KLOR-CON M20) 20 MEQ tablet Take 1 tablet (20 mEq total) by mouth daily. 10/06/14  Yes Debbrah Alar, NP  PRESCRIPTION MEDICATION Prescription pain killer. Patient's husband does not remember the name. [facility] has not dispensed pain killer medication recently. Last dose last night (03/15/15)   Yes Historical  Provider, MD  protective barrier (RESTORE) CREA Apply twice daily to upper buttocks 02/09/15  Yes Debbrah Alar, NP  ciprofloxacin (CIPRO) 250 MG tablet Take 1 tablet (250 mg total) by mouth 2 (two) times daily. Patient not taking: Reported on 03/16/2015 03/02/15   Debbrah Alar, NP  polyethylene glycol powder (GLYCOLAX/MIRALAX) powder For constipation you can try a small amount of miralax (i.e. One teaspoon) in 8 oz of juice once daily. If diarrhea, give every day. May increase to a full cap once daily as needed. Patient not taking: Reported on 03/16/2015 02/09/15   Debbrah Alar, NP     Vital Signs: BP 160/60 mmHg  Pulse 65  Temp(Src) 99.1 F (37.3 C) (Oral)  Resp 18  Ht 4' 11.5" (1.511 m)  Wt 160 lb 11.2 oz (72.893 kg)  BMI 31.93 kg/m2  SpO2 97%  Physical Exam  Abdominal: Soft. Bowel sounds are normal. There is no tenderness.  Skin: Skin is warm and dry.  Site of Left PCN intact Clean and dry Sl tender No bleeding or infection Output great 1.3 L yesterday 100 cc in bag- yellow appearing urine  Nursing note and vitals reviewed.   Imaging: Ir Nephrostomy Placement Left  03/18/2015   CLINICAL DATA:  Left hydronephrosis, obstructing left UPJ calculus.  EXAM: ULTRASOUND GUIDANCE FOR NEPHROSTOMY ACCESS  TEN FRENCH LEFT NEPHROSTOMY UNDER FLUOROSCOPY  Date:  9/2/20169/08/2014 9:23 am  Radiologist:  M. Daryll Brod, MD  Guidance:  Ultrasound and fluoroscopic  FLUOROSCOPY TIME:  1 minutes 24 seconds, 19.9 mGy  MEDICATIONS AND MEDICAL HISTORY: 0.5 mg Versed,  25 mcg fentanyl, patient is already receiving IV Rocephin.  ANESTHESIA/SEDATION: 8 minutes  CONTRAST:  32mL OMNIPAQUE IOHEXOL 300 MG/ML  SOLN  COMPLICATIONS: None immediate  PROCEDURE: Informed consent was obtained from the patient following explanation of the procedure, risks, benefits and alternatives. The patient understands, agrees and consents for the procedure. All questions were addressed. A time out was performed.   Maximal barrier sterile technique utilized including caps, mask, sterile gowns, sterile gloves, large sterile drape, hand hygiene, and Betadine.  Previous imaging reviewed. Patient positioned prone. Preliminary ultrasound demonstrated hydronephrosis of the left kidney. Under sterile conditions and local anesthesia, a dilated lower pole calyx was accessed percutaneously. This was performed with ultrasound. Images obtained for documentation. There was return of cloudy urine. Sample sent for Gram stain and culture. Guidewire advanced followed by the Accustick dilator set. Amplatz guidewire exchange performed followed by tract dilatation to insert a 10 [place] drain. Retention loop formed in the collecting system. Contrast injection confirms position. Catheter secured with a Prolene suture and connected to external gravity drainage. No immediate complication. Patient tolerated the procedure well.  IMPRESSION: Successful ultrasound and fluoroscopic left 10 French percutaneous nephrostomy.   Electronically Signed   By: Jerilynn Mages.  Shick M.D.   On: 03/18/2015 11:31    Labs:  CBC:  Recent Labs  03/17/15 0515 03/18/15 0450 03/20/15 0452 03/21/15 0509  WBC 16.3* 8.3 8.5 9.9  HGB 12.9 12.1 10.8* 11.2*  HCT 38.8 36.0 32.3* 34.4*  PLT 124* 99* 103* 116*    COAGS:  Recent Labs  03/17/15 1620 03/18/15 0450  INR 1.34 1.40  APTT 32 43*    BMP:  Recent Labs  03/18/15 0450 03/19/15 1025 03/20/15 0452 03/21/15 0509  NA 137 139 137 141  K 3.7 5.0 2.9* 3.7  CL 105 111 104 105  CO2 29 22 27 30   GLUCOSE 101* 79 144* 117*  BUN 24* 19 18 16   CALCIUM 7.9* 7.8* 7.8* 8.3*  CREATININE 1.71* 1.09* 0.84 0.70  GFRNONAA 27* 46* >60 >60  GFRAA 31* 53* >60 >60    LIVER FUNCTION TESTS:  Recent Labs  07/03/14 0737 03/02/15 1546 03/16/15 1528 03/17/15 0515  BILITOT 0.4 0.4 0.9 0.7  AST 15 15 32 36  ALT 5 7 13* 20  ALKPHOS 59 70 76 67  PROT 6.7 7.3 7.9 6.3*  ALBUMIN 2.9* 3.5 3.4* 2.8*    Assessment  and Plan:  L PCN intact Doing well Will follow   Signed: Damilola Flamm A 03/21/2015, 11:20 AM   I spent a total of 15 Minutes at the the patient's bedside AND on the patient's hospital floor or unit, greater than 50% of which was counseling/coordinating care for L PCN

## 2015-03-22 DIAGNOSIS — N200 Calculus of kidney: Secondary | ICD-10-CM

## 2015-03-22 DIAGNOSIS — D696 Thrombocytopenia, unspecified: Secondary | ICD-10-CM

## 2015-03-22 DIAGNOSIS — B377 Candidal sepsis: Principal | ICD-10-CM

## 2015-03-22 DIAGNOSIS — G9340 Encephalopathy, unspecified: Secondary | ICD-10-CM

## 2015-03-22 DIAGNOSIS — N12 Tubulo-interstitial nephritis, not specified as acute or chronic: Secondary | ICD-10-CM

## 2015-03-22 DIAGNOSIS — B49 Unspecified mycosis: Secondary | ICD-10-CM

## 2015-03-22 DIAGNOSIS — L89151 Pressure ulcer of sacral region, stage 1: Secondary | ICD-10-CM

## 2015-03-22 DIAGNOSIS — N390 Urinary tract infection, site not specified: Secondary | ICD-10-CM

## 2015-03-22 DIAGNOSIS — N1330 Unspecified hydronephrosis: Secondary | ICD-10-CM

## 2015-03-22 MED ORDER — FLUCONAZOLE IN SODIUM CHLORIDE 200-0.9 MG/100ML-% IV SOLN
200.0000 mg | INTRAVENOUS | Status: DC
  Filled 2015-03-22: qty 100

## 2015-03-22 MED ORDER — ENSURE ENLIVE PO LIQD: 237.0000 mL | Freq: Two times a day (BID) | ORAL | Status: AC

## 2015-03-22 MED ORDER — ALPRAZOLAM 0.25 MG PO TABS: ORAL_TABLET | ORAL | Status: AC

## 2015-03-22 MED ORDER — ACETAMINOPHEN 325 MG PO TABS: 650.0000 mg | ORAL_TABLET | Freq: Four times a day (QID) | ORAL | Status: AC | PRN

## 2015-03-22 MED ORDER — FLUCONAZOLE 200 MG PO TABS
200.0000 mg | ORAL_TABLET | Freq: Every day | ORAL | Status: DC
  Filled 2015-03-22: qty 1

## 2015-03-22 MED ORDER — POLYETHYLENE GLYCOL 3350 17 G PO PACK: 17.0000 g | PACK | Freq: Every day | ORAL | Status: AC

## 2015-03-22 MED ORDER — FLUCONAZOLE 200 MG PO TABS: 200.0000 mg | ORAL_TABLET | Freq: Every day | ORAL | Status: AC

## 2015-03-22 NOTE — Discharge Summary (Addendum)
Physician Discharge Summary  [name] JJH:[id number] DOB: March 19, 1931 DOA: 03/16/2015  PCP: Nance Pear., NP  Admit date: 03/16/2015 Discharge date: 03/22/2015  Time spent: 25  minutes  Recommendations for Outpatient Follow-up:  1. Will need 2 weeks of antifungal therapy with Fluconazole. Stop date 04/05/15 2. Will need to restart the Fluconazole before lithotripsy and continue for another 2 weeks   Discharge Diagnoses:  Principal Problem:   Pyelonephritis Active Problems:   Decubitus ulcer of sacral region, stage 1   Major neurocognitive disorder, due to alzheimer's disease, with behavioral disturbance, mild   UTI (lower urinary tract infection)   Acute encephalopathy   Left nephrolithiasis   Hydronephrosis of left kidney   Acute kidney injury   Cerebrovascular disease   Thrombocytopenia   Normocytic anemia   Fungemia   Recurrent UTI   Candidemia   Discharge Condition: Stable  Diet recommendation: Regular diet  Filed Weights   03/16/15 1913  Weight: 72.893 kg (160 lb 11.2 oz)    History of present illness:  [name] is an 81 y.o. female the PMH of chronic respiratory failure on home oxygen, recurrent UTI on chronic prophylactic antibiotics therapy with Macrodantin, depression/anxiety and obesity who was admitted on 03/16/15 with altered mental status and malaise with decreased oral intake. On initial evaluation in the ER, the patient was found to have a UTI with possible pyelonephritis and acute kidney injury with creatinine of 1.67.  Hospital course  HPyelonephritis/lower UTI with left nephrolithiasis and hydronephrosis/fungemia - Initially treated with empiric Rocephin and Diflucan, urine cultures positive for yeast.  - Given yeast in blood and urine cultures, Diflucan changed to Eraxis. - Repeat blood cultures done. - Status post percutaneous nephrostomy tube by IR. - Outpatient follow-up with urology for definitive stone management. -  patient will need 2 weeks of therapy with Fluconazole 200 mg po daily, stop date on 04/05/15 discussed with  ID Dr Megan Salon  - Patient to follow up urology as outpatient for lithotripsy, will need to restart the Fluconazole before lithotripsy and continue for two more weeks as per ID  Patient was seen by ohpthalmology Dr Maylene Roes, and he found no evidence of ophthalmic fungal seeding.    Normocytic anemia - Likely secondary to anemia of chronic disease.    Hypokalemia - potassium replaced, she takes potassium at home.   Acute kidney injury - Secondary to obstructing nephrolithiasis, hydronephrosis, and decreased oral intake. - Baseline creatinine is 1.01. Creatinine now back to baseline values.   Decubitus ulcer of sacral region, stage 1 - Skin care per nursing.   Major neurocognitive disorder, due to alzheimer's disease, with behavioral disturbance, mild/CVD - Had worsening of cognitive status on admission. Slowly improving. - CT of the head negative for acute findings, but showed atrophy and chronic cerebrovascular disease. - Continue Aricept and Namenda.   Acute encephalopathy - Secondary to acute infection, management as above.   Thrombocytopenia  - Discontinue heparin.  - Platelets were normal 03/16/15. Platelet count 99---> 103--->116 after stopping heparin.    Procedures:  Percutaneous nephrostomy tube per IR  Consultations:  ID  Urology  IR  Discharge Exam: Filed Vitals:   03/22/15 1221  BP: 133/72  Pulse: 72  Temp: 97.3 F (36.3 C)  Resp: 16    General: Appear in no acute distress Cardiovascular: S1S2 RRR Respiratory: Clear bilaterally  Discharge Instructions   Discharge Instructions    Diet - low sodium heart healthy    Complete by:  As directed  Increase activity slowly    Complete by:  As directed           Current Discharge Medication List    START taking these medications   Details  acetaminophen (TYLENOL) 325 MG tablet  Take 2 tablets (650 mg total) by mouth every 6 (six) hours as needed for mild pain (or Fever >/= 101). Qty: 10 tablet, Refills: 0    feeding supplement, ENSURE ENLIVE, (ENSURE ENLIVE) LIQD Take 237 mLs by mouth 2 (two) times daily between meals. Qty: 237 mL, Refills: 12    fluconazole (DIFLUCAN) 200 MG tablet Take 1 tablet (200 mg total) by mouth daily. Qty: 14 tablet, Refills: 0    polyethylene glycol (MIRALAX / GLYCOLAX) packet Take 17 g by mouth daily. Qty: 14 each, Refills: 0      CONTINUE these medications which have CHANGED   Details  ALPRAZolam (XANAX) 0.25 MG tablet Take one table by mouth 3 times daily as needed for agitation or insomnia. Qty: 30 tablet, Refills: 0      CONTINUE these medications which have NOT CHANGED   Details  aspirin 325 MG EC tablet Take 325 mg by mouth daily.      citalopram (CELEXA) 20 MG tablet Take 1 tablet (20 mg total) by mouth daily. Qty: 90 tablet, Refills: 1    Memantine HCl-Donepezil HCl (NAMZARIC) 28-10 MG CP24 Take 1 tablet by mouth daily. Qty: 30 capsule, Refills: 3    mometasone (NASONEX) 50 MCG/ACT nasal spray Place 1-2 sprays into the nose daily.    Multiple Vitamin (MULTIVITAMIN WITH MINERALS) TABS tablet Take 1 tablet by mouth daily.   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 Qty: 90 capsule, Refills: 1    oxybutynin (DITROPAN) 5 MG tablet Take 1 tablet (5 mg total) by mouth 3 (three) times daily. Qty: 270 tablet, Refills: 1    pantoprazole (PROTONIX) 40 MG tablet Take 1 tablet (40 mg total) by mouth daily before breakfast. Qty: 90 tablet, Refills: 1    potassium chloride SA (KLOR-CON M20) 20 MEQ tablet Take 1 tablet (20 mEq total) by mouth daily. Qty: 90 tablet, Refills: 1    protective barrier (RESTORE) CREA Apply twice daily to upper buttocks Qty: 120 g, Refills: 5      STOP taking these  medications     OVER THE COUNTER MEDICATION      PRESCRIPTION MEDICATION      ciprofloxacin (CIPRO) 250 MG tablet      polyethylene glycol powder (GLYCOLAX/MIRALAX) powder        Allergies  Allergen Reactions  . Penicillins     REACTION: adolesent   Follow-up Information    Follow up with Nickie Retort, MD In 2 weeks.   Specialty:  Urology   Why:  Urology Follow Up   Contact information:   [place] [id number] [phone]        The results of significant diagnostics from this hospitalization (including imaging, microbiology, ancillary and laboratory) are listed below for reference.    Significant Diagnostic Studies: Ct Abdomen Pelvis Wo Contrast  03/16/2015   CLINICAL DATA:  Pyelonephritis  EXAM: CT ABDOMEN AND PELVIS WITHOUT CONTRAST  TECHNIQUE: Multidetector CT imaging of the abdomen and pelvis was performed following the standard protocol without IV contrast.  COMPARISON:  01/27/2008  FINDINGS: Lower chest: Streak artifact from patient's hands overlying the upper abdomen  noted. Trace left greater than right pleural fluid with associated compressive atelectasis. Lung bases are otherwise clear.  Hepatobiliary: Common duct fusiform dilation at its midportion measuring 1.2 cm maximally, with tapering to the ampulla noted. Liver is grossly unremarkable.  Pancreas: Fatty infiltrated and atrophic but grossly unremarkable otherwise.  Spleen: Normal  Adrenals/Urinary Tract: Adrenal glands appear normal. Right sided extrarenal pelvis noted but the right kidney is otherwise grossly unremarkable. 2.0 cm left ureteropelvic junction calculus image 40, with moderate left hydronephrosis. Left mid renal calculus measures 1.3 cm image 41. Trace left perinephric fluid/stranding.  Stomach/Bowel: Colonic diverticuli noted without evidence for diverticulitis. No bowel wall thickening or focal segmental dilatation is identified.  Vascular/Lymphatic: Moderate atheromatous aortic  calcification without aneurysm. Small retroperitoneal nodes are identified measuring less than 1 cm in diameter.  Other: Ovaries are unremarkable. Detail in the anatomic pelvis is obscured by streak artifact from bilateral hip arthroplasty.  No free air. Left greater than right fat containing inguinal hernias.  Musculoskeletal: Multilevel severe degenerative change of the lumbar spine. No acute osseous abnormality.  IMPRESSION: 2 cm left ureteropelvic junction calculus producing moderate left hydronephrosis.   Electronically Signed   By: Conchita Paris M.D.   On: 03/16/2015 21:48   Dg Chest 2 View  03/16/2015   CLINICAL DATA:  Decreased appetite and mental status changes.  EXAM: CHEST  2 VIEW  COMPARISON:  03/02/2015  FINDINGS: The cardiac silhouette, mediastinal and hilar contours are within normal limits and stable. There is moderate tortuosity, ectasia and calcification of the thoracic aorta. Low lung volumes with vascular crowding and bibasilar atelectasis. No definite infiltrates or effusions. The bony thorax is intact. Stable degenerative changes involving both shoulders.  IMPRESSION: Low lung volumes with vascular crowding and bibasilar atelectasis. No definite infiltrates or effusions.   Electronically Signed   By: Marijo Sanes M.D.   On: 03/16/2015 16:03   Dg Chest 2 View  03/02/2015   CLINICAL DATA:  Somnolence.  Alzheimer's disease.  Depression.  EXAM: CHEST  2 VIEW  COMPARISON:  08/23/2014  FINDINGS: There is no focal parenchymal opacity. There is no pleural effusion or pneumothorax. The heart and mediastinal contours are unremarkable.  There is prior right rotator cuff repair. There is moderate osteoarthritis of bilateral glenohumeral joints.  IMPRESSION: No active cardiopulmonary disease.   Electronically Signed   By: Kathreen Devoid   On: 03/02/2015 16:24   Ct Head Wo Contrast  03/16/2015   CLINICAL DATA:  Weakness and decline in appetite, abnormal behavior. Dementia, altered mental status.   EXAM: CT HEAD WITHOUT CONTRAST  TECHNIQUE: Contiguous axial images were obtained from the base of the skull through the vertex without intravenous contrast.  COMPARISON:  01/02/2011.  FINDINGS: No evidence of an acute infarct, acute hemorrhage, mass lesion, mass effect or hydrocephalus. Atrophy. Periventricular low attenuation. Visualized portions of the paranasal sinuses and mastoid air cells are clear.  IMPRESSION: 1. No acute intracranial abnormality. 2. Atrophy and chronic microvascular white matter ischemic changes.   Electronically Signed   By: Lorin Picket M.D.   On: 03/16/2015 16:12   Ir Nephrostomy Placement Left  03/18/2015   CLINICAL DATA:  Left hydronephrosis, obstructing left UPJ calculus.  EXAM: ULTRASOUND GUIDANCE FOR NEPHROSTOMY ACCESS  TEN FRENCH LEFT NEPHROSTOMY UNDER FLUOROSCOPY  Date:  9/2/20169/08/2014 9:23 am  Radiologist:  M. Daryll Brod, MD  Guidance:  Ultrasound and fluoroscopic  FLUOROSCOPY TIME:  1 minutes 24 seconds, 19.9 mGy  MEDICATIONS AND MEDICAL HISTORY: 0.5 mg Versed, 25  mcg fentanyl, patient is already receiving IV Rocephin.  ANESTHESIA/SEDATION: 8 minutes  CONTRAST:  4mL OMNIPAQUE IOHEXOL 300 MG/ML  SOLN  COMPLICATIONS: None immediate  PROCEDURE: Informed consent was obtained from the patient following explanation of the procedure, risks, benefits and alternatives. The patient understands, agrees and consents for the procedure. All questions were addressed. A time out was performed.  Maximal barrier sterile technique utilized including caps, mask, sterile gowns, sterile gloves, large sterile drape, hand hygiene, and Betadine.  Previous imaging reviewed. Patient positioned prone. Preliminary ultrasound demonstrated hydronephrosis of the left kidney. Under sterile conditions and local anesthesia, a dilated lower pole calyx was accessed percutaneously. This was performed with ultrasound. Images obtained for documentation. There was return of cloudy urine. Sample sent for Gram  stain and culture. Guidewire advanced followed by the Accustick dilator set. Amplatz guidewire exchange performed followed by tract dilatation to insert a 10 [place] drain. Retention loop formed in the collecting system. Contrast injection confirms position. Catheter secured with a Prolene suture and connected to external gravity drainage. No immediate complication. Patient tolerated the procedure well.  IMPRESSION: Successful ultrasound and fluoroscopic left 10 French percutaneous nephrostomy.   Electronically Signed   By: Jerilynn Mages.  Shick M.D.   On: 03/18/2015 11:31    Microbiology: Recent Results (from the past 240 hour(s))  Urine culture     Status: None   Collection Time: 03/16/15  2:32 PM  Result Value Ref Range Status   Specimen Description URINE, CATHETERIZED  Final   Special Requests NONE  Final   Culture   Final    20,000 COLONIES/mL YEAST Performed at [facility]    Report Status 03/18/2015 FINAL  Final  Blood culture (routine x 2)     Status: None   Collection Time: 03/16/15  5:15 PM  Result Value Ref Range Status   Specimen Description BLOOD LEFT HAND  Final   Special Requests IN PEDIATRIC BOTTLE .5CC  Final   Culture   Final    NO GROWTH 5 DAYS Performed at [facility]    Report Status 03/21/2015 FINAL  Final  Blood culture (routine x 2)     Status: None   Collection Time: 03/17/15  5:15 AM  Result Value Ref Range Status   Specimen Description BLOOD LEFT ARM  Final   Special Requests BOTTLES DRAWN AEROBIC AND ANAEROBIC 5CC  Final   Culture  Setup Time   Final    YEAST HYPHAL ELEMENTS SEEN AEROBIC BOTTLE ONLY CRITICAL RESULT CALLED TO, READ BACK BY AND VERIFIED WITH: L [email or web address]@[id number]  03/19/15 MKELLY    Culture   Final    CANDIDA ALBICANS Performed at [facility]    Report Status 03/21/2015 FINAL  Final  Urine culture     Status: None   Collection Time: 03/18/15  9:02 AM  Result Value Ref Range Status   Specimen Description URINE, CATHETERIZED   Final   Special Requests NEPHROSTOMY  Final   Culture   Final    >=100,000 COLONIES/mL YEAST Performed at [facility]    Report Status 03/19/2015 FINAL  Final  Culture, blood (routine x 2)     Status: None (Preliminary result)   Collection Time: 03/20/15 11:25 AM  Result Value Ref Range Status   Specimen Description BLOOD LEFT HAND  Final   Special Requests IN PEDIATRIC BOTTLE 2CC  Final   Culture   Final    NO GROWTH < 24 HOURS Performed at [facility]  Report Status PENDING  Incomplete  Culture, blood (routine x 2)     Status: None (Preliminary result)   Collection Time: 03/20/15 11:30 AM  Result Value Ref Range Status   Specimen Description BLOOD LEFT ARM  Final   Special Requests BOTTLES DRAWN AEROBIC AND ANAEROBIC 5CC  Final   Culture   Final    NO GROWTH < 24 HOURS Performed at [facility]    Report Status PENDING  Incomplete     Labs: Basic Metabolic Panel:  Recent Labs Lab 03/16/15 2050 03/17/15 0515 03/18/15 0450 03/19/15 1025 03/20/15 0452 03/21/15 0509  NA  --  139 137 139 137 141  K  --  3.9 3.7 5.0 2.9* 3.7  CL  --  105 105 111 104 105  CO2  --  27 29 22 27 30   GLUCOSE  --  125* 101* 79 144* 117*  BUN  --  17 24* 19 18 16   CREATININE  --  1.54* 1.71* 1.09* 0.84 0.70  CALCIUM  --  8.2* 7.9* 7.8* 7.8* 8.3*  MG 1.8  --   --   --   --   --    Liver Function Tests:  Recent Labs Lab 03/16/15 1528 03/17/15 0515  AST 32 36  ALT 13* 20  ALKPHOS 76 67  BILITOT 0.9 0.7  PROT 7.9 6.3*  ALBUMIN 3.4* 2.8*   No results for input(s): LIPASE, AMYLASE in the last 168 hours.  Recent Labs Lab 03/16/15 1529  AMMONIA 21   CBC:  Recent Labs Lab 03/16/15 1528 03/17/15 0515 03/18/15 0450 03/20/15 0452 03/21/15 0509  WBC 15.4* 16.3* 8.3 8.5 9.9  NEUTROABS 12.4*  --   --   --   --   HGB 15.1* 12.9 12.1 10.8* 11.2*  HCT 45.0 38.8 36.0 32.3* 34.4*  MCV 99.8 99.2 98.4 96.4 96.9  PLT 150 124* 99* 103* 116*   Cardiac  Enzymes:  Recent Labs Lab 03/16/15 2050 03/17/15 0515 03/17/15 1130  TROPONINI <0.03 0.03 0.04*   BNP: BNP (last 3 results) No results for input(s): BNP in the last 8760 hours.  ProBNP (last 3 results)  Recent Labs  07/02/14 1838  PROBNP 470.0*    CBG:  Recent Labs Lab 03/16/15 1532  GLUCAP 131*       Signed:  LAMA,GAGAN S  [facility] 03/22/2015, 1:07 PM

## 2015-03-22 NOTE — Progress Notes (Signed)
Referring Physician(s): Urology Dr. Pilar Jarvis  Chief Complaint: Left hydronephrosis s/p left PCN 03/18/15  Subjective: Without complaints   Allergies: Penicillins  Medications: Prior to Admission medications   Medication Sig Start Date End Date Taking? Authorizing Provider  ALPRAZolam Duanne Moron) 0.25 MG tablet Take one table by mouth 3 times daily as needed for agitation or insomnia. 02/09/15  Yes Debbrah Alar, NP  aspirin 325 MG EC tablet Take 325 mg by mouth daily.     Yes Historical Provider, MD  citalopram (CELEXA) 20 MG tablet Take 1 tablet (20 mg total) by mouth daily. 02/09/15  Yes Debbrah Alar, NP  Memantine HCl-Donepezil HCl (NAMZARIC) 28-10 MG CP24 Take 1 tablet by mouth daily. 12/16/14  Yes Carmen Dohmeier, MD  mometasone (NASONEX) 50 MCG/ACT nasal spray Place 1-2 sprays into the nose daily.   Yes Historical Provider, MD  Multiple Vitamin (MULTIVITAMIN WITH MINERALS) TABS tablet Take 1 tablet by mouth daily.   Yes Historical Provider, MD  nitrofurantoin (MACRODANTIN) 100 MG capsule Take 1 capsule (100 mg total) by mouth daily. 02/09/15  Yes Debbrah Alar, NP  OVER THE COUNTER MEDICATION Azo flush  'cranberry medication' reported by patient's husband. Take one daily   Yes Historical Provider, MD  oxybutynin (DITROPAN) 5 MG tablet Take 1 tablet (5 mg total) by mouth 3 (three) times daily. 02/09/15  Yes Debbrah Alar, NP  pantoprazole (PROTONIX) 40 MG tablet Take 1 tablet (40 mg total) by mouth daily before breakfast. 02/09/15  Yes Debbrah Alar, NP  potassium chloride SA (KLOR-CON M20) 20 MEQ tablet Take 1 tablet (20 mEq total) by mouth daily. 10/06/14  Yes Debbrah Alar, NP  PRESCRIPTION MEDICATION Prescription pain killer. Patient's husband does not remember the name. [facility] has not dispensed pain killer medication recently. Last dose last night (03/15/15)   Yes Historical Provider, MD  protective barrier (RESTORE) CREA Apply twice daily to upper buttocks  02/09/15  Yes Debbrah Alar, NP  ciprofloxacin (CIPRO) 250 MG tablet Take 1 tablet (250 mg total) by mouth 2 (two) times daily. Patient not taking: Reported on 03/16/2015 03/02/15   Debbrah Alar, NP  polyethylene glycol powder (GLYCOLAX/MIRALAX) powder For constipation you can try a small amount of miralax (i.e. One teaspoon) in 8 oz of juice once daily. If diarrhea, give every day. May increase to a full cap once daily as needed. Patient not taking: Reported on 03/16/2015 02/09/15   Debbrah Alar, NP   Vital Signs: BP 145/69 mmHg  Pulse 73  Temp(Src) 97.7 F (36.5 C) (Oral)  Resp 16  Ht 4' 11.5" (1.511 m)  Wt 160 lb 11.2 oz (72.893 kg)  BMI 31.93 kg/m2  SpO2 97%  Physical Exam General: Awake, NAD Abd: Soft, NT, ND, Left PCN intact, good 10 cc clear yellow urine output in bag, 2050 cc/24 hrs  Imaging: No results found.  Labs:  CBC:  Recent Labs  03/17/15 0515 03/18/15 0450 03/20/15 0452 03/21/15 0509  WBC 16.3* 8.3 8.5 9.9  HGB 12.9 12.1 10.8* 11.2*  HCT 38.8 36.0 32.3* 34.4*  PLT 124* 99* 103* 116*    COAGS:  Recent Labs  03/17/15 1620 03/18/15 0450  INR 1.34 1.40  APTT 32 43*    BMP:  Recent Labs  03/18/15 0450 03/19/15 1025 03/20/15 0452 03/21/15 0509  NA 137 139 137 141  K 3.7 5.0 2.9* 3.7  CL 105 111 104 105  CO2 29 22 27 30   GLUCOSE 101* 79 144* 117*  BUN 24* 19 18 16  CALCIUM 7.9* 7.8* 7.8* 8.3*  CREATININE 1.71* 1.09* 0.84 0.70  GFRNONAA 27* 46* >60 >60  GFRAA 31* 53* >60 >60    LIVER FUNCTION TESTS:  Recent Labs  07/03/14 0737 03/02/15 1546 03/16/15 1528 03/17/15 0515  BILITOT 0.4 0.4 0.9 0.7  AST 15 15 32 36  ALT 5 7 13* 20  ALKPHOS 59 70 76 67  PROT 6.7 7.3 7.9 6.3*  ALBUMIN 2.9* 3.5 3.4* 2.8*    Assessment and Plan: Left pyelonephritis  Left hydronephrosis secondary to renal calculi S/p left PCN 03/18/15, Cx yeast-ID following, good clear yellow output, afebrile, renal function improving and wbc wnl, H/H  stable Plans per Urology, if no urological intervention within 6-8 weeks will need routine PCN exchange by IR   Signed: Tsosie Billing D 03/22/2015, 12:09 PM   I spent a total of 15 Minutes at the the patient's bedside AND on the patient's hospital floor or unit, greater than 50% of which was counseling/coordinating care for left pyelonephritis/hydronephrosis

## 2015-03-22 NOTE — Care Management Important Message (Signed)
Important Message  Patient Details  Name: [name] MRN: [id number] Date of Birth: 06/13/1931   Medicare Important Message Given:  Yes-second notification given    Dessa Phi, RN 03/22/2015, 3:16 PM

## 2015-03-22 NOTE — Care Management Note (Signed)
Case Management Note  Patient Details  Name: [name] MRN: [id number] Date of Birth: 14-Sep-1930  Subjective/Objective:                    Action/Plan:d/c snf.   Expected Discharge Date:   (unknown)               Expected Discharge Plan:  [place]  In-House Referral:     Discharge planning Services  CM Consult  Post Acute Care Choice:  Home Health (Active w/[facility]) Choice offered to:     DME Arranged:    DME Agency:     HH Arranged:    [place] Agency:     Status of Service:  Completed, signed off  Medicare Important Message Given:  Yes-second notification given Date Medicare IM Given:    Medicare IM give by:    Date Additional Medicare IM Given:    Additional Medicare Important Message give by:     If discussed at [place] of Stay Meetings, dates discussed:    Additional Comments:  Dessa Phi, RN 03/22/2015, 1:09 PM

## 2015-03-22 NOTE — Clinical Social Work Placement (Signed)
Patient has a bed at [facility] when stable - anticipating possible discharge today.    Raynaldo Opitz, [facility] Clinical Social Worker cell #: [phone]    CLINICAL SOCIAL WORK PLACEMENT  NOTE  Date:  03/22/2015  Patient Details  Name: [name] MRN: [id number] Date of Birth: [birth year]  Clinical Social Work is seeking post-discharge placement for this patient at the [place] level of care (*CSW will initial, date and re-position this form in  chart as items are completed):  Yes   Patient/family provided with [place] Work Department's list of facilities offering this level of care within the geographic area requested by the patient (or if unable, by the patient's family).  Yes   Patient/family informed of their freedom to choose among providers that offer the needed level of care, that participate in Medicare, Medicaid or managed care program needed by the patient, have an available bed and are willing to accept the patient.  Yes   Patient/family informed of [facility]'s ownership interest in [facility] and [facility], as well as of the fact that they are under no obligation to receive care at these facilities.  PASRR submitted to EDS on 03/18/15     PASRR number received on 03/18/15     Existing PASRR number confirmed on       FL2 transmitted to all facilities in geographic area requested by pt/family on 03/18/15     FL2 transmitted to all facilities within larger geographic area on       Patient informed that his/her managed care company has contracts with or will negotiate with certain facilities, including the following:        Yes   Patient/family informed of bed offers received.  Patient chooses bed at [facility]     Physician recommends and patient chooses bed at      Patient to be transferred to [facility] on  .  Patient to be transferred to facility by       Patient  family notified on   of transfer.  Name of family member notified:        PHYSICIAN       Additional Comment:    _______________________________________________ Standley Brooking, LCSW 03/22/2015, 11:18 AM

## 2015-03-22 NOTE — Progress Notes (Signed)
Patient ID: [name], female   DOB: 08/12/1930, 81 y.o.   MRN: [id number]         [facility] for Infectious Disease    Date of Admission:  03/16/2015   Total days of antifungal therapy 4          Principal Problem:   Pyelonephritis Active Problems:   Decubitus ulcer of sacral region, stage 1   Major neurocognitive disorder, due to alzheimer's disease, with behavioral disturbance, mild   UTI (lower urinary tract infection)   Acute encephalopathy   Left nephrolithiasis   Hydronephrosis of left kidney   Acute kidney injury   Cerebrovascular disease   Thrombocytopenia   Normocytic anemia   Fungemia   Recurrent UTI   Candidemia   . aspirin  325 mg Oral Daily  . citalopram  20 mg Oral Daily  . donepezil  10 mg Oral Daily  . feeding supplement (ENSURE ENLIVE)  237 mL Oral BID BM  . fluconazole  200 mg Oral Daily  . memantine  28 mg Oral Daily  . oxybutynin  5 mg Oral TID  . pantoprazole  40 mg Oral QAC breakfast  . polyethylene glycol  17 g Oral Daily  . sodium chloride  3 mL Intravenous Q12H    Past Medical History  Diagnosis Date  . Depressive disorder, not elsewhere classified   . Anxiety state, unspecified   . Benign neoplasm of colon   . Diverticulitis of colon (without mention of hemorrhage)   . Mixed hyperlipidemia   . Obesity, unspecified   . Unspecified essential hypertension   . Chronic rhinitis   . GERD   . INTERTRIGO, CANDIDAL   . Major neurocognitive disorder due to Alzheimer's disease, probable, without behavioral disturbance     Followed by Dr. Brett Fairy at [facility].   Marland Kitchen PRESSURE ULCER STAGE I     sacral, recurrent  . Arthritis   . Bladder infection     Social History  Substance Use Topics  . Smoking status: Former Smoker -- 0.20 packs/day for 2 years    Types: Cigarettes    Quit date: 07/16/1958  . Smokeless tobacco: Never Used     Comment: stopped 67 years ago  . Alcohol Use: No    Family History  Problem Relation  Age of Onset  . Breast cancer Mother   . Colon cancer Mother    Allergies  Allergen Reactions  . Penicillins     REACTION: adolesent    OBJECTIVE: Filed Vitals:   03/21/15 2159 03/22/15 0638 03/22/15 1221 03/22/15 1606  BP: 155/80 145/69 133/72 132/60  Pulse: 74 73 72 86  Temp: 99.1 F (37.3 C) 97.7 F (36.5 C) 97.3 F (36.3 C) 99 F (37.2 C)  TempSrc: Oral Oral Oral Oral  Resp: 16 16 16    Height:      Weight:      SpO2: 99% 97% 97% 96%   Body mass index is 31.93 kg/(m^2).  Lab Results Lab Results  Component Value Date   WBC 9.9 03/21/2015   HGB 11.2* 03/21/2015   HCT 34.4* 03/21/2015   MCV 96.9 03/21/2015   PLT 116* 03/21/2015    Lab Results  Component Value Date   CREATININE 0.70 03/21/2015   BUN 16 03/21/2015   NA 141 03/21/2015   K 3.7 03/21/2015   CL 105 03/21/2015   CO2 30 03/21/2015    Lab Results  Component Value Date   ALT 20 03/17/2015   AST 36  03/17/2015   ALKPHOS 67 03/17/2015   BILITOT 0.7 03/17/2015     Microbiology: Recent Results (from the past 240 hour(s))  Urine culture     Status: None   Collection Time: 03/16/15  2:32 PM  Result Value Ref Range Status   Specimen Description URINE, CATHETERIZED  Final   Special Requests NONE  Final   Culture   Final    20,000 COLONIES/mL YEAST Performed at [facility]    Report Status 03/18/2015 FINAL  Final  Blood culture (routine x 2)     Status: None   Collection Time: 03/16/15  5:15 PM  Result Value Ref Range Status   Specimen Description BLOOD LEFT HAND  Final   Special Requests IN PEDIATRIC BOTTLE .5CC  Final   Culture   Final    NO GROWTH 5 DAYS Performed at [facility]    Report Status 03/21/2015 FINAL  Final  Blood culture (routine x 2)     Status: None   Collection Time: 03/17/15  5:15 AM  Result Value Ref Range Status   Specimen Description BLOOD LEFT ARM  Final   Special Requests BOTTLES DRAWN AEROBIC AND ANAEROBIC 5CC  Final   Culture  Setup Time   Final      YEAST HYPHAL ELEMENTS SEEN AEROBIC BOTTLE ONLY CRITICAL RESULT CALLED TO, READ BACK BY AND VERIFIED WITH: L HUDSON@[id number]  03/19/15 MKELLY    Culture   Final    CANDIDA ALBICANS Performed at [facility]    Report Status 03/21/2015 FINAL  Final  Urine culture     Status: None   Collection Time: 03/18/15  9:02 AM  Result Value Ref Range Status   Specimen Description URINE, CATHETERIZED  Final   Special Requests NEPHROSTOMY  Final   Culture   Final    >=100,000 COLONIES/mL YEAST Performed at [facility]    Report Status 03/19/2015 FINAL  Final  Culture, blood (routine x 2)     Status: None (Preliminary result)   Collection Time: 03/20/15 11:25 AM  Result Value Ref Range Status   Specimen Description BLOOD LEFT HAND  Final   Special Requests IN PEDIATRIC BOTTLE 2CC  Final   Culture   Final    NO GROWTH 2 DAYS Performed at [facility]    Report Status PENDING  Incomplete  Culture, blood (routine x 2)     Status: None (Preliminary result)   Collection Time: 03/20/15 11:30 AM  Result Value Ref Range Status   Specimen Description BLOOD LEFT ARM  Final   Special Requests BOTTLES DRAWN AEROBIC AND ANAEROBIC 5CC  Final   Culture   Final    NO GROWTH 2 DAYS Performed at [facility]    Report Status PENDING  Incomplete     ASSESSMENT: She had transient fungemia with Candida albicans related to urinary tract infection. Repeat blood cultures on 03/20/2015 are negative. I exam yesterday did not reveal any evidence of endophthalmitis. I will give her 2 weeks of high-dose oral fluconazole starting from her negative blood culture on 03/20/2015. I would also consider restarting fluconazole prior to upcoming lithotripsy and plan on a second two-week course of therapy in an attempt to prevent recurrent fungemia.  PLAN: 1. Continue fluconazole 200 mg orally once daily through 04/03/2015 2. I will sign off now  Michel Bickers, MD [facility] for  [place] [phone] pager   [phone] cell 03/22/2015, 4:25 PM

## 2015-03-23 ENCOUNTER — Ambulatory Visit: Payer: Medicare Other | Admitting: Family

## 2015-03-23 ENCOUNTER — Telehealth: Payer: Self-pay | Admitting: Family

## 2015-03-23 NOTE — Telephone Encounter (Signed)
Caller name: Daryll Drown Relationship to patient: daughter Can be reached: [phone]  Please call about paperwork for FMLA. She has questions.

## 2015-03-23 NOTE — Telephone Encounter (Signed)
Pt with dementia needs TCM follow up please.  She may have gone to SNF and if so we can see her back after d/c from snf. Thanks.

## 2015-03-23 NOTE — Telephone Encounter (Signed)
Faxed successfully to [employer]. Sent for scanning. JG//CMA  

## 2015-03-23 NOTE — Telephone Encounter (Signed)
Spoke with [name], she will fax FMLA forms to nursing fax, attn:  Gilmore Laroche.  Papers need to cover dates 8/31 thru 9/2 and 03/22/15. She was assisting pt while in the hospital and pt's healthcare power of attorney. Pt was transported to [facility] facility and physical therapy will start today, 03/23/15 for 2 hours each day x 2 weeks. [name] would like FMLA to reflect this as well as she plans to be present during PT each day. Surgery will be scheduled upon completion of PT.  She would like [place] to fax completed forms to her at [phone] as well as to the # on the FMLA papers. Awaiting forms.

## 2015-03-23 NOTE — Telephone Encounter (Signed)
Left message for [name] to return my call

## 2015-03-23 NOTE — Telephone Encounter (Signed)
FMLA forms received and forwarded to PCP for completion.

## 2015-03-24 NOTE — Telephone Encounter (Signed)
Looks like she is in snf.

## 2015-03-25 DIAGNOSIS — Z0279 Encounter for issue of other medical certificate: Secondary | ICD-10-CM

## 2015-03-25 LAB — CULTURE, BLOOD (ROUTINE X 2)
CULTURE: NO GROWTH
Culture: NO GROWTH

## 2015-03-28 ENCOUNTER — Telehealth: Payer: Self-pay | Admitting: Family

## 2015-03-28 NOTE — Telephone Encounter (Signed)
[name] called and asked if paperwork was also faxed to [facility]. Please notify her at [phone].

## 2015-03-28 NOTE — Telephone Encounter (Signed)
Forms faxed to [facility] at [phone]. Left message for [name] to return my call re: form status.

## 2015-03-28 NOTE — Telephone Encounter (Addendum)
Caller name: Daryll Drown  Relation to pt: daughter  Call back number: [phone]    Reason for call:  Daughter would like to know if forms were faxed

## 2015-03-29 NOTE — Telephone Encounter (Signed)
Form was also faxed to [facility] yesterday. Left message on Maureen's voicemail re: form completion and to call if any further questions.

## 2015-04-01 ENCOUNTER — Other Ambulatory Visit: Payer: Self-pay | Admitting: Urology

## 2015-04-04 ENCOUNTER — Other Ambulatory Visit: Payer: Self-pay | Admitting: Urology

## 2015-04-05 ENCOUNTER — Telehealth: Payer: Self-pay | Admitting: Family

## 2015-04-05 NOTE — Telephone Encounter (Signed)
Pam from [facility] will be faxing over their surgical clearance form for the patient.   No call back needed. Just wanted to inform.

## 2015-04-06 ENCOUNTER — Other Ambulatory Visit: Payer: Self-pay | Admitting: Urology

## 2015-04-06 NOTE — Telephone Encounter (Signed)
Janett Billow-- have you seen this come through?

## 2015-04-08 ENCOUNTER — Ambulatory Visit: Payer: Medicare Other | Admitting: Family

## 2015-04-14 ENCOUNTER — Encounter: Payer: Self-pay | Admitting: Internal Medicine

## 2015-04-14 ENCOUNTER — Ambulatory Visit (INDEPENDENT_AMBULATORY_CARE_PROVIDER_SITE_OTHER): Payer: Medicare Other | Admitting: Internal Medicine

## 2015-04-14 VITALS — BP 118/82 | HR 83 | Temp 98.1°F | Resp 18

## 2015-04-14 DIAGNOSIS — J310 Chronic rhinitis: Secondary | ICD-10-CM | POA: Diagnosis not present

## 2015-04-14 DIAGNOSIS — J209 Acute bronchitis, unspecified: Secondary | ICD-10-CM

## 2015-04-14 MED ORDER — AZITHROMYCIN 250 MG PO TABS: ORAL_TABLET | ORAL | Status: DC

## 2015-04-14 MED ORDER — FLUTICASONE PROPIONATE 50 MCG/ACT NA SUSP: 1.0000 | Freq: Two times a day (BID) | NASAL | Status: AC | PRN

## 2015-04-14 NOTE — Progress Notes (Signed)
   Subjective:    Patient ID: [name], female    DOB: [birth year], 81 y.o.   MRN: [id number]  HPI She's had a deep cough for over a week; it is loose but nonproductive. She has also had clear rhinitis for the last 7-10 days. Other symptoms include watery left eye. Her daughter states she wheezes at night.  She just completed a five-day course of Levaquin 250 mg daily. She has 2 kidney stones and will have surgical removal 10/7. Nephrostomy tube is in place.  Because of her dementia is been difficult to hydrate her adequately.   Review of Systems The husband and daughter deny fever or chills.  She has no nasal purulence.  There are no other extrinsic symptoms other than the watery eyes and possible wheezing. Specifically there is no sneezing.    Objective:   Physical Exam  Pertinent or positive findings include:  She appears chronically ill. She sits in a wheelchair and is essentially nonverbal. Wasting of temporal musculature is present. Wax is noted in both ears. She has severe plaque formation of the teeth. Heart sounds are distant. With forced expiration she has coarse rhonchi at the bases. The cough is loose but nonproductive. Nephrostomy tube is present on the left.  General appearance :adequately nourished; in no distress.  Eyes: No conjunctival inflammation or scleral icterus is present.  Oral exam:  Lips and gums are healthy appearing.  Heart:  Normal rate and regular rhythm. S1 and S2 normal without gallop, murmur, click, rub or other extra sounds    Lungs:No increased work of breathing.   Abdomen: bowel sounds normal, soft and non-tender without masses, organomegaly or hernias noted.  No guarding or rebound.   Vascular : all pulses equal ; no bruits present.  Skin:Warm & dry.  Intact without suspicious lesions or rashes ; no tenting or jaundice   Lymphatic: No lymphadenopathy is noted about the head, neck, axilla.   Neuro: Strength, tone dramatically  decreased      Assessment & Plan:  #1 acute bronchitis  See orders recommendations

## 2015-04-14 NOTE — Telephone Encounter (Signed)
error 

## 2015-04-14 NOTE — Patient Instructions (Signed)
Plain Mucinex (NOT D) for thick secretions ;force NON dairy fluids .  Fluticasone 1 spray in each nostril twice a day as needed spraying toward opposite ear @ 45 degree angle, not straight up into nostril. Plain Allegra (NOT D )  160 daily , Loratidine 10 mg , OR Zyrtec 10 mg @ bedtime  as needed for itchy eyes & sneezing.

## 2015-04-14 NOTE — Progress Notes (Signed)
Pre visit review using our clinic review tool, if applicable. No additional management support is needed unless otherwise documented below in the visit note. 

## 2015-04-15 NOTE — Progress Notes (Signed)
03/17/15- EKG - EPIC  07/04/2014- ECHO- EPIC  01/07/2015- EEG- EPIC  04/14/2015- LOV with PCP- DR Linna Darner in [facility] for acute bronchitis

## 2015-04-15 NOTE — Patient Instructions (Addendum)
[name]  04/15/2015   Your procedure is scheduled on:04/22/2015    Report to [facility] Main  Entrance take [facility]  elevators to 3rd floor to  [place] at     1045 AM.  Call this number if you have problems the morning of surgery [phone]   Remember: ONLY 1 PERSON MAY GO WITH YOU TO SHORT STAY TO GET  READY MORNING OF [place].  Do not eat food or drink liquids :After Midnight.     Take these medicines the morning of surgery with A SIP OF WATER:   Xanax if needed, Flonase if needed, nasonex nasal spray , ditropan, Protonix, Namzaric, Citalopram                                You may not have any metal on your body including hair pins and              piercings  Do not wear jewelry, make-up, lotions, powders or perfumes, deodorant             Do not wear nail polish.  Do not shave  48 hours prior to surgery.                Do not bring valuables to the hospital. [place].  Contacts, dentures or bridgework may not be worn into surgery.  Leave suitcase in the car. After surgery it may be brought to your room.         Special Instructions: coughing and deep breathing exercises, leg exercises               Please read over the following fact sheets you were given: _____________________________________________________________________             [facility] - Preparing for Surgery Before surgery, you can play an important role.  Because skin is not sterile, your skin needs to be as free of germs as possible.  You can reduce the number of germs on your skin by washing with CHG (chlorahexidine gluconate) soap before surgery.  CHG is an antiseptic cleaner which kills germs and bonds with the skin to continue killing germs even after washing. Please DO NOT use if you have an allergy to CHG or antibacterial soaps.  If your skin becomes reddened/irritated stop using the CHG and inform your  nurse when you arrive at Short Stay. Do not shave (including legs and underarms) for at least 48 hours prior to the first CHG shower.  You may shave your face/neck. Please follow these instructions carefully:  1.  Shower with CHG Soap the night before surgery and the  morning of Surgery.  2.  If you choose to wash your hair, wash your hair first as usual with your  normal  shampoo.  3.  After you shampoo, rinse your hair and body thoroughly to remove the  shampoo.                           4.  Use CHG as you would any other liquid soap.  You can apply chg directly  to the skin and wash  Gently with a scrungie or clean washcloth.  5.  Apply the CHG Soap to your body ONLY FROM THE NECK DOWN.   Do not use on face/ open                           Wound or open sores. Avoid contact with eyes, ears mouth and genitals (private parts).                       Wash face,  Genitals (private parts) with your normal soap.             6.  Wash thoroughly, paying special attention to the area where your surgery  will be performed.  7.  Thoroughly rinse your body with warm water from the neck down.  8.  DO NOT shower/wash with your normal soap after using and rinsing off  the CHG Soap.                9.  Pat yourself dry with a clean towel.            10.  Wear clean pajamas.            11.  Place clean sheets on your bed the night of your first shower and do not  sleep with pets. Day of Surgery : Do not apply any lotions/deodorants the morning of surgery.  Please wear clean clothes to the hospital/surgery center.  FAILURE TO FOLLOW THESE INSTRUCTIONS MAY RESULT IN THE CANCELLATION OF YOUR SURGERY PATIENT SIGNATURE_________________________________  NURSE SIGNATURE__________________________________  ________________________________________________________________________

## 2015-04-18 ENCOUNTER — Encounter (HOSPITAL_COMMUNITY)
Admission: RE | Admit: 2015-04-18 | Discharge: 2015-04-18 | Disposition: A | Discharge status: Home / Self Care | Payer: Medicare Other | Source: Ambulatory Visit | Attending: Urology | Admitting: Urology

## 2015-04-18 ENCOUNTER — Encounter (HOSPITAL_COMMUNITY): Payer: Self-pay

## 2015-04-18 ENCOUNTER — Ambulatory Visit (HOSPITAL_COMMUNITY)
Admission: RE | Admit: 2015-04-18 | Discharge: 2015-04-18 | Disposition: A | Discharge status: Home / Self Care | Payer: Medicare Other | Source: Ambulatory Visit | Attending: Anesthesiology | Admitting: Anesthesiology

## 2015-04-18 DIAGNOSIS — Z87891 Personal history of nicotine dependence: Secondary | ICD-10-CM | POA: Insufficient documentation

## 2015-04-18 DIAGNOSIS — Z01812 Encounter for preprocedural laboratory examination: Secondary | ICD-10-CM | POA: Diagnosis not present

## 2015-04-18 DIAGNOSIS — J40 Bronchitis, not specified as acute or chronic: Secondary | ICD-10-CM

## 2015-04-18 DIAGNOSIS — R05 Cough: Secondary | ICD-10-CM | POA: Insufficient documentation

## 2015-04-18 DIAGNOSIS — I10 Essential (primary) hypertension: Secondary | ICD-10-CM | POA: Diagnosis not present

## 2015-04-18 DIAGNOSIS — N200 Calculus of kidney: Secondary | ICD-10-CM | POA: Diagnosis not present

## 2015-04-18 DIAGNOSIS — E669 Obesity, unspecified: Secondary | ICD-10-CM | POA: Insufficient documentation

## 2015-04-18 DIAGNOSIS — Z01818 Encounter for other preprocedural examination: Secondary | ICD-10-CM | POA: Insufficient documentation

## 2015-04-18 HISTORY — DX: Non-pressure chronic ulcer of skin of other sites with unspecified severity: L98.499

## 2015-04-18 HISTORY — DX: Personal history of other diseases of the digestive system: Z87.19

## 2015-04-18 HISTORY — DX: Bronchitis, not specified as acute or chronic: J40

## 2015-04-18 HISTORY — DX: Family history of other specified conditions: Z84.89

## 2015-04-18 HISTORY — DX: Personal history of urinary (tract) infections: Z87.440

## 2015-04-18 LAB — BASIC METABOLIC PANEL
ANION GAP: 8 (ref 5–15)
BUN: 19 mg/dL (ref 6–20)
CALCIUM: 9.1 mg/dL (ref 8.9–10.3)
CHLORIDE: 104 mmol/L (ref 101–111)
CO2: 27 mmol/L (ref 22–32)
Creatinine, Ser: 0.97 mg/dL (ref 0.44–1.00)
GFR calc non Af Amer: 53 mL/min — ABNORMAL LOW (ref >60)
GLUCOSE: 91 mg/dL (ref 65–99)
Potassium: 4.5 mmol/L (ref 3.5–5.1)
Sodium: 139 mmol/L (ref 135–145)

## 2015-04-18 LAB — CBC
HEMATOCRIT: 40.9 % (ref 36.0–46.0)
HEMOGLOBIN: 13.3 g/dL (ref 12.0–15.0)
MCH: 32.5 pg (ref 26.0–34.0)
MCHC: 32.5 g/dL (ref 30.0–36.0)
MCV: 100 fL (ref 78.0–100.0)
Platelets: 173 10*3/uL (ref 150–400)
RBC: 4.09 MIL/uL (ref 3.87–5.11)
RDW: 14.7 % (ref 11.5–15.5)
WBC: 8.1 10*3/uL (ref 4.0–10.5)

## 2015-04-20 ENCOUNTER — Ambulatory Visit: Payer: Medicare Other | Admitting: Nurse Practitioner

## 2015-04-20 LAB — URINE CULTURE: Culture: 40000

## 2015-04-20 NOTE — Progress Notes (Signed)
Urine cultures results from nephrostomy tube faxed to dr Louis Meckel fax [phone] and [phone], fax confirmation received and placed on pt chart

## 2015-04-22 ENCOUNTER — Encounter (HOSPITAL_COMMUNITY)
Admission: RE | Disposition: A | Discharge status: Home / Self Care | Payer: Self-pay | Source: Ambulatory Visit | Attending: Urology

## 2015-04-22 ENCOUNTER — Observation Stay (HOSPITAL_COMMUNITY)
Admission: RE | Admit: 2015-04-22 | Discharge: 2015-04-24 | Disposition: A | Discharge status: Home / Self Care | Payer: Medicare Other | Source: Ambulatory Visit | Attending: Urology | Admitting: Urology

## 2015-04-22 ENCOUNTER — Ambulatory Visit (HOSPITAL_COMMUNITY): Payer: Medicare Other | Admitting: Certified Registered Nurse Anesthetist

## 2015-04-22 ENCOUNTER — Encounter (HOSPITAL_COMMUNITY): Payer: Self-pay | Admitting: *Deleted

## 2015-04-22 ENCOUNTER — Ambulatory Visit (HOSPITAL_COMMUNITY): Payer: Medicare Other

## 2015-04-22 DIAGNOSIS — I2510 Atherosclerotic heart disease of native coronary artery without angina pectoris: Secondary | ICD-10-CM | POA: Insufficient documentation

## 2015-04-22 DIAGNOSIS — L89159 Pressure ulcer of sacral region, unspecified stage: Secondary | ICD-10-CM | POA: Diagnosis not present

## 2015-04-22 DIAGNOSIS — Z87440 Personal history of urinary (tract) infections: Secondary | ICD-10-CM | POA: Diagnosis not present

## 2015-04-22 DIAGNOSIS — I10 Essential (primary) hypertension: Secondary | ICD-10-CM | POA: Diagnosis not present

## 2015-04-22 DIAGNOSIS — Z8719 Personal history of other diseases of the digestive system: Secondary | ICD-10-CM | POA: Diagnosis not present

## 2015-04-22 DIAGNOSIS — R627 Adult failure to thrive: Secondary | ICD-10-CM | POA: Diagnosis not present

## 2015-04-22 DIAGNOSIS — Z87442 Personal history of urinary calculi: Secondary | ICD-10-CM | POA: Diagnosis not present

## 2015-04-22 DIAGNOSIS — N816 Rectocele: Secondary | ICD-10-CM | POA: Insufficient documentation

## 2015-04-22 DIAGNOSIS — M1990 Unspecified osteoarthritis, unspecified site: Secondary | ICD-10-CM | POA: Insufficient documentation

## 2015-04-22 DIAGNOSIS — G309 Alzheimer's disease, unspecified: Secondary | ICD-10-CM | POA: Diagnosis not present

## 2015-04-22 DIAGNOSIS — Z419 Encounter for procedure for purposes other than remedying health state, unspecified: Secondary | ICD-10-CM

## 2015-04-22 DIAGNOSIS — J45909 Unspecified asthma, uncomplicated: Secondary | ICD-10-CM | POA: Diagnosis not present

## 2015-04-22 DIAGNOSIS — K429 Umbilical hernia without obstruction or gangrene: Secondary | ICD-10-CM | POA: Insufficient documentation

## 2015-04-22 DIAGNOSIS — L8990 Pressure ulcer of unspecified site, unspecified stage: Secondary | ICD-10-CM | POA: Insufficient documentation

## 2015-04-22 DIAGNOSIS — K449 Diaphragmatic hernia without obstruction or gangrene: Secondary | ICD-10-CM | POA: Insufficient documentation

## 2015-04-22 DIAGNOSIS — Z6831 Body mass index (BMI) 31.0-31.9, adult: Secondary | ICD-10-CM | POA: Diagnosis not present

## 2015-04-22 DIAGNOSIS — E7800 Pure hypercholesterolemia, unspecified: Secondary | ICD-10-CM | POA: Diagnosis not present

## 2015-04-22 DIAGNOSIS — K5730 Diverticulosis of large intestine without perforation or abscess without bleeding: Secondary | ICD-10-CM | POA: Diagnosis not present

## 2015-04-22 DIAGNOSIS — Z880 Allergy status to penicillin: Secondary | ICD-10-CM | POA: Insufficient documentation

## 2015-04-22 DIAGNOSIS — B3749 Other urogenital candidiasis: Secondary | ICD-10-CM | POA: Diagnosis not present

## 2015-04-22 DIAGNOSIS — N132 Hydronephrosis with renal and ureteral calculous obstruction: Secondary | ICD-10-CM | POA: Diagnosis not present

## 2015-04-22 DIAGNOSIS — F0280 Dementia in other diseases classified elsewhere without behavioral disturbance: Secondary | ICD-10-CM | POA: Diagnosis not present

## 2015-04-22 DIAGNOSIS — K219 Gastro-esophageal reflux disease without esophagitis: Secondary | ICD-10-CM | POA: Insufficient documentation

## 2015-04-22 DIAGNOSIS — Z96641 Presence of right artificial hip joint: Secondary | ICD-10-CM | POA: Diagnosis not present

## 2015-04-22 DIAGNOSIS — N200 Calculus of kidney: Secondary | ICD-10-CM | POA: Diagnosis present

## 2015-04-22 DIAGNOSIS — Z7982 Long term (current) use of aspirin: Secondary | ICD-10-CM | POA: Insufficient documentation

## 2015-04-22 HISTORY — PX: NEPHROLITHOTOMY: SHX5134

## 2015-04-22 LAB — CBC
HCT: 37.2 % (ref 36.0–46.0)
Hemoglobin: 12.2 g/dL (ref 12.0–15.0)
MCH: 32.6 pg (ref 26.0–34.0)
MCHC: 32.8 g/dL (ref 30.0–36.0)
MCV: 99.5 fL (ref 78.0–100.0)
PLATELETS: 136 10*3/uL — AB (ref 150–400)
RBC: 3.74 MIL/uL — ABNORMAL LOW (ref 3.87–5.11)
RDW: 14.8 % (ref 11.5–15.5)
WBC: 10.1 10*3/uL (ref 4.0–10.5)

## 2015-04-22 LAB — BASIC METABOLIC PANEL
Anion gap: 8 (ref 5–15)
BUN: 14 mg/dL (ref 6–20)
CHLORIDE: 104 mmol/L (ref 101–111)
CO2: 28 mmol/L (ref 22–32)
CREATININE: 1.04 mg/dL — AB (ref 0.44–1.00)
Calcium: 8.6 mg/dL — ABNORMAL LOW (ref 8.9–10.3)
GFR calc Af Amer: 56 mL/min — ABNORMAL LOW (ref >60)
GFR calc non Af Amer: 49 mL/min — ABNORMAL LOW (ref >60)
Glucose, Bld: 99 mg/dL (ref 65–99)
Potassium: 3.4 mmol/L — ABNORMAL LOW (ref 3.5–5.1)
SODIUM: 140 mmol/L (ref 135–145)

## 2015-04-22 SURGERY — NEPHROLITHOTOMY PERCUTANEOUS
Anesthesia: General | Site: Back | Laterality: Left

## 2015-04-22 MED ORDER — HEPARIN SODIUM (PORCINE) 5000 UNIT/ML IJ SOLN
5000.0000 [IU] | Freq: Three times a day (TID) | INTRAMUSCULAR | Status: DC
  Administered 2015-04-23 – 2015-04-24 (×4): 5000 [IU] via SUBCUTANEOUS
  Filled 2015-04-22 (×5): qty 1

## 2015-04-22 MED ORDER — VITAMINS A & D EX OINT
TOPICAL_OINTMENT | CUTANEOUS | Status: AC
Start: 2015-04-22 — End: 2015-04-23
  Administered 2015-04-23: 5
  Filled 2015-04-22: qty 5

## 2015-04-22 MED ORDER — ACETAMINOPHEN 10 MG/ML IV SOLN
1000.0000 mg | Freq: Four times a day (QID) | INTRAVENOUS | Status: AC
  Administered 2015-04-23 (×4): 1000 mg via INTRAVENOUS
  Filled 2015-04-22 (×4): qty 100

## 2015-04-22 MED ORDER — IOHEXOL 300 MG/ML  SOLN
INTRAMUSCULAR | Status: DC | PRN
  Administered 2015-04-22: 48 mL via INTRAVENOUS

## 2015-04-22 MED ORDER — FLUCONAZOLE 100 MG PO TABS: 100.0000 mg | ORAL_TABLET | Freq: Every day | ORAL | Status: DC

## 2015-04-22 MED ORDER — MEMANTINE HCL ER 28 MG PO CP24
28.0000 mg | ORAL_CAPSULE | Freq: Every day | ORAL | Status: DC
  Administered 2015-04-23 – 2015-04-24 (×2): 28 mg via ORAL
  Filled 2015-04-22 (×2): qty 1

## 2015-04-22 MED ORDER — FLUCONAZOLE 100 MG PO TABS
100.0000 mg | ORAL_TABLET | Freq: Every day | ORAL | Status: DC
  Administered 2015-04-23 – 2015-04-24 (×2): 100 mg via ORAL
  Filled 2015-04-22 (×2): qty 1

## 2015-04-22 MED ORDER — MORPHINE SULFATE (PF) 2 MG/ML IV SOLN: 2.0000 mg | INTRAVENOUS | Status: DC | PRN

## 2015-04-22 MED ORDER — MEMANTINE HCL-DONEPEZIL HCL ER 28-10 MG PO CP24: 1.0000 | ORAL_CAPSULE | Freq: Every day | ORAL | Status: DC

## 2015-04-22 MED ORDER — LORAZEPAM 2 MG/ML IJ SOLN: 1.0000 mg | Freq: Four times a day (QID) | INTRAMUSCULAR | Status: DC | PRN

## 2015-04-22 MED ORDER — ACETAMINOPHEN 10 MG/ML IV SOLN
INTRAVENOUS | Status: DC | PRN
  Administered 2015-04-22: 1000 mg via INTRAVENOUS

## 2015-04-22 MED ORDER — FENTANYL CITRATE (PF) 100 MCG/2ML IJ SOLN
INTRAMUSCULAR | Status: DC | PRN
  Administered 2015-04-22 (×3): 50 ug via INTRAVENOUS

## 2015-04-22 MED ORDER — BELLADONNA ALKALOIDS-OPIUM 16.2-60 MG RE SUPP: 1.0000 | Freq: Three times a day (TID) | RECTAL | Status: DC | PRN

## 2015-04-22 MED ORDER — CITALOPRAM HYDROBROMIDE 20 MG PO TABS
20.0000 mg | ORAL_TABLET | Freq: Every day | ORAL | Status: DC
  Administered 2015-04-23 – 2015-04-24 (×2): 20 mg via ORAL
  Filled 2015-04-22 (×2): qty 1

## 2015-04-22 MED ORDER — FENTANYL CITRATE (PF) 250 MCG/5ML IJ SOLN
INTRAMUSCULAR | Status: AC
  Filled 2015-04-22: qty 25

## 2015-04-22 MED ORDER — PROPOFOL 10 MG/ML IV BOLUS
INTRAVENOUS | Status: DC | PRN
  Administered 2015-04-22: 100 mg via INTRAVENOUS

## 2015-04-22 MED ORDER — PANTOPRAZOLE SODIUM 40 MG PO TBEC
40.0000 mg | DELAYED_RELEASE_TABLET | Freq: Every day | ORAL | Status: DC
  Administered 2015-04-23 – 2015-04-24 (×2): 40 mg via ORAL
  Filled 2015-04-22 (×2): qty 1

## 2015-04-22 MED ORDER — FLUCONAZOLE 100 MG PO TABS: 200.0000 mg | ORAL_TABLET | Freq: Every day | ORAL | Status: DC

## 2015-04-22 MED ORDER — CIPROFLOXACIN HCL 250 MG PO TABS
250.0000 mg | ORAL_TABLET | Freq: Two times a day (BID) | ORAL | Status: DC
  Administered 2015-04-23 – 2015-04-24 (×3): 250 mg via ORAL
  Filled 2015-04-22 (×3): qty 1

## 2015-04-22 MED ORDER — ALPRAZOLAM 0.25 MG PO TABS
0.2500 mg | ORAL_TABLET | Freq: Three times a day (TID) | ORAL | Status: DC | PRN
  Administered 2015-04-22 – 2015-04-23 (×4): 0.25 mg via ORAL
  Filled 2015-04-22 (×4): qty 1

## 2015-04-22 MED ORDER — SODIUM CHLORIDE 0.9 % IR SOLN
Status: DC | PRN
  Administered 2015-04-22: 13000 mL

## 2015-04-22 MED ORDER — HYDROMORPHONE HCL 1 MG/ML IJ SOLN: 0.2500 mg | INTRAMUSCULAR | Status: DC | PRN

## 2015-04-22 MED ORDER — METOPROLOL TARTRATE 1 MG/ML IV SOLN
INTRAVENOUS | Status: AC
  Administered 2015-04-22: 5 mg
  Filled 2015-04-22: qty 5

## 2015-04-22 MED ORDER — BELLADONNA ALKALOIDS-OPIUM 16.2-60 MG RE SUPP: 1.0000 | Freq: Four times a day (QID) | RECTAL | Status: DC | PRN

## 2015-04-22 MED ORDER — KCL IN DEXTROSE-NACL 20-5-0.45 MEQ/L-%-% IV SOLN
INTRAVENOUS | Status: DC
  Administered 2015-04-22: 22:00:00 via INTRAVENOUS
  Filled 2015-04-22 (×2): qty 1000

## 2015-04-22 MED ORDER — CEFAZOLIN SODIUM-DEXTROSE 2-3 GM-% IV SOLR
INTRAVENOUS | Status: AC
  Filled 2015-04-22: qty 50

## 2015-04-22 MED ORDER — POLYETHYLENE GLYCOL 3350 17 G PO PACK
17.0000 g | PACK | Freq: Every day | ORAL | Status: DC
  Administered 2015-04-22 – 2015-04-24 (×3): 17 g via ORAL
  Filled 2015-04-22 (×3): qty 1

## 2015-04-22 MED ORDER — ONDANSETRON HCL 4 MG/2ML IJ SOLN: 4.0000 mg | Freq: Four times a day (QID) | INTRAMUSCULAR | Status: DC | PRN

## 2015-04-22 MED ORDER — EPHEDRINE SULFATE 50 MG/ML IJ SOLN
INTRAMUSCULAR | Status: DC | PRN
  Administered 2015-04-22: 5 mg via INTRAVENOUS
  Administered 2015-04-22: 10 mg via INTRAVENOUS
  Administered 2015-04-22: 5 mg via INTRAVENOUS

## 2015-04-22 MED ORDER — MENTHOL 3 MG MT LOZG: 1.0000 | LOZENGE | OROMUCOSAL | Status: DC | PRN

## 2015-04-22 MED ORDER — ONDANSETRON HCL 4 MG/2ML IJ SOLN
INTRAMUSCULAR | Status: DC | PRN
  Administered 2015-04-22: 4 mg via INTRAVENOUS

## 2015-04-22 MED ORDER — BUPIVACAINE HCL (PF) 0.5 % IJ SOLN
INTRAMUSCULAR | Status: AC
  Filled 2015-04-22: qty 30

## 2015-04-22 MED ORDER — HYDRALAZINE HCL 20 MG/ML IJ SOLN: 5.0000 mg | INTRAMUSCULAR | Status: DC | PRN

## 2015-04-22 MED ORDER — ONDANSETRON HCL 4 MG PO TABS: 4.0000 mg | ORAL_TABLET | Freq: Three times a day (TID) | ORAL | Status: DC | PRN

## 2015-04-22 MED ORDER — NEOSTIGMINE METHYLSULFATE 10 MG/10ML IV SOLN
INTRAVENOUS | Status: DC | PRN
Start: 2015-04-22 — End: 2015-04-22
  Administered 2015-04-22: 2 mg via INTRAVENOUS

## 2015-04-22 MED ORDER — CEFAZOLIN SODIUM-DEXTROSE 2-3 GM-% IV SOLR
2.0000 g | Freq: Once | INTRAVENOUS | Status: AC
  Administered 2015-04-22: 2 g via INTRAVENOUS

## 2015-04-22 MED ORDER — PROPOFOL 10 MG/ML IV BOLUS
INTRAVENOUS | Status: AC
  Filled 2015-04-22: qty 20

## 2015-04-22 MED ORDER — ACETAMINOPHEN 10 MG/ML IV SOLN
INTRAVENOUS | Status: AC
  Filled 2015-04-22: qty 100

## 2015-04-22 MED ORDER — LACTATED RINGERS IV SOLN
INTRAVENOUS | Status: DC | PRN
  Administered 2015-04-22 (×2): via INTRAVENOUS

## 2015-04-22 MED ORDER — CIPROFLOXACIN HCL 250 MG PO TABS: 250.0000 mg | ORAL_TABLET | Freq: Two times a day (BID) | ORAL | Status: DC

## 2015-04-22 MED ORDER — OXYCODONE HCL 5 MG PO TABS: 5.0000 mg | ORAL_TABLET | ORAL | Status: DC | PRN

## 2015-04-22 MED ORDER — METOPROLOL TARTRATE 1 MG/ML IV SOLN
2.5000 mg | Freq: Four times a day (QID) | INTRAVENOUS | Status: DC
  Administered 2015-04-22 – 2015-04-24 (×8): 2.5 mg via INTRAVENOUS
  Filled 2015-04-22 (×7): qty 5

## 2015-04-22 MED ORDER — FLUCONAZOLE IN SODIUM CHLORIDE 400-0.9 MG/200ML-% IV SOLN
400.0000 mg | Freq: Once | INTRAVENOUS | Status: AC
  Administered 2015-04-22: 400 mg via INTRAVENOUS
  Filled 2015-04-22: qty 200

## 2015-04-22 MED ORDER — SUCCINYLCHOLINE CHLORIDE 20 MG/ML IJ SOLN
INTRAMUSCULAR | Status: DC | PRN
  Administered 2015-04-22: 100 mg via INTRAVENOUS

## 2015-04-22 MED ORDER — PHENOL 1.4 % MT LIQD: 1.0000 | OROMUCOSAL | Status: DC | PRN

## 2015-04-22 MED ORDER — CIPROFLOXACIN IN D5W 400 MG/200ML IV SOLN
INTRAVENOUS | Status: AC
  Filled 2015-04-22: qty 200

## 2015-04-22 MED ORDER — ESMOLOL HCL 10 MG/ML IV SOLN
INTRAVENOUS | Status: DC | PRN
  Administered 2015-04-22 (×2): 20 mg via INTRAVENOUS

## 2015-04-22 MED ORDER — DONEPEZIL HCL 5 MG PO TABS
10.0000 mg | ORAL_TABLET | Freq: Every day | ORAL | Status: DC
  Administered 2015-04-23 – 2015-04-24 (×2): 10 mg via ORAL
  Filled 2015-04-22 (×2): qty 2

## 2015-04-22 MED ORDER — BUPIVACAINE HCL 0.5 % IJ SOLN
INTRAMUSCULAR | Status: DC | PRN
  Administered 2015-04-22: 30 mL

## 2015-04-22 MED ORDER — POTASSIUM CHLORIDE 2 MEQ/ML IV SOLN: INTRAVENOUS | Status: DC

## 2015-04-22 MED ORDER — CIPROFLOXACIN IN D5W 400 MG/200ML IV SOLN
400.0000 mg | INTRAVENOUS | Status: AC
  Administered 2015-04-22 (×2): 400 mg via INTRAVENOUS

## 2015-04-22 MED ORDER — LACTATED RINGERS IV SOLN: INTRAVENOUS | Status: DC

## 2015-04-22 MED ORDER — BISACODYL 10 MG RE SUPP: 10.0000 mg | Freq: Every day | RECTAL | Status: DC | PRN

## 2015-04-22 MED ORDER — 0.9 % SODIUM CHLORIDE (POUR BTL) OPTIME
TOPICAL | Status: DC | PRN
  Administered 2015-04-22: 1000 mL

## 2015-04-22 MED ORDER — GLYCOPYRROLATE 0.2 MG/ML IJ SOLN
INTRAMUSCULAR | Status: DC | PRN
Start: 2015-04-22 — End: 2015-04-22
  Administered 2015-04-22: 0.2 mg via INTRAVENOUS

## 2015-04-22 MED ORDER — ESMOLOL HCL 10 MG/ML IV SOLN
INTRAVENOUS | Status: AC
  Filled 2015-04-22: qty 10

## 2015-04-22 MED ORDER — ROCURONIUM BROMIDE 100 MG/10ML IV SOLN
INTRAVENOUS | Status: DC | PRN
  Administered 2015-04-22: 20 mg via INTRAVENOUS

## 2015-04-22 SURGICAL SUPPLY — 61 items
APL SKNCLS STERI-STRIP NONHPOA (GAUZE/BANDAGES/DRESSINGS) ×4
BAG URINE DRAINAGE (UROLOGICAL SUPPLIES) ×6 IMPLANT
BASKET STNLS GEMINI 4WIRE 3FR (BASKET) IMPLANT
BASKET STONE NCOMPASS (UROLOGICAL SUPPLIES) IMPLANT
BASKET ZERO TIP NITINOL 2.4FR (BASKET) ×2 IMPLANT
BENZOIN TINCTURE PRP APPL 2/3 (GAUZE/BANDAGES/DRESSINGS) ×6 IMPLANT
BLADE SURG 15 STRL LF DISP TIS (BLADE) ×2 IMPLANT
BLADE SURG 15 STRL SS (BLADE) ×3
BSKT STON RTRVL GEM 120X11 3FR (BASKET)
BSKT STON RTRVL ZERO TP 2.4FR (BASKET) ×2
CATCHER STONE W/TUBE ADAPTER (UROLOGICAL SUPPLIES) IMPLANT
CATH AINSWORTH 30CC 24FR (CATHETERS) ×2 IMPLANT
CATH FOLEY 2WAY SLVR  5CC 16FR (CATHETERS) ×1
CATH FOLEY 2WAY SLVR 5CC 16FR (CATHETERS) ×2 IMPLANT
CATH IMAGER II 65CM (CATHETERS) ×3 IMPLANT
CATH URET 5FR 28IN OPEN ENDED (CATHETERS) ×1 IMPLANT
CATH X-FORCE N30 NEPHROSTOMY (TUBING) ×3 IMPLANT
CHLORAPREP W/TINT 26ML (MISCELLANEOUS) ×3 IMPLANT
COVER SURGICAL LIGHT HANDLE (MISCELLANEOUS) ×1 IMPLANT
DRAPE C-ARM 42X120 X-RAY (DRAPES) ×3 IMPLANT
DRAPE LINGEMAN PERC (DRAPES) ×3 IMPLANT
DRSG PAD ABDOMINAL 8X10 ST (GAUZE/BANDAGES/DRESSINGS) ×6 IMPLANT
DRSG TEGADERM 8X12 (GAUZE/BANDAGES/DRESSINGS) ×4 IMPLANT
EXTRACTOR STONE NITINOL NGAGE (UROLOGICAL SUPPLIES) ×2 IMPLANT
FIBER LASER FLEXIVA 1000 (UROLOGICAL SUPPLIES) IMPLANT
FIBER LASER FLEXIVA 200 (UROLOGICAL SUPPLIES) ×2 IMPLANT
FIBER LASER FLEXIVA 365 (UROLOGICAL SUPPLIES) IMPLANT
FIBER LASER FLEXIVA 550 (UROLOGICAL SUPPLIES) IMPLANT
FIBER LASER TRAC TIP (UROLOGICAL SUPPLIES) IMPLANT
GAUZE SPONGE 4X4 12PLY STRL (GAUZE/BANDAGES/DRESSINGS) ×3 IMPLANT
GLOVE BIOGEL M STRL SZ7.5 (GLOVE) ×3 IMPLANT
GOWN STRL REUS W/TWL XL LVL3 (GOWN DISPOSABLE) ×3 IMPLANT
GUIDEWIRE AMPLAZ .035X145 (WIRE) ×6 IMPLANT
GUIDEWIRE ANG ZIPWIRE 038X150 (WIRE) IMPLANT
GUIDEWIRE STR DUAL SENSOR (WIRE) ×3 IMPLANT
INSERT SILICONE FOR G14464 (MISCELLANEOUS) ×3 IMPLANT
IV SET EXTENSION CATH 6 NF (IV SETS) ×3 IMPLANT
KIT BASIN OR (CUSTOM PROCEDURE TRAY) ×1 IMPLANT
MANIFOLD NEPTUNE II (INSTRUMENTS) ×3 IMPLANT
NDL SPNL 22GX3.5 QUINCKE BK (NEEDLE) IMPLANT
NDL TROCAR 18X15 ECHO (NEEDLE) IMPLANT
NDL TROCAR 18X20 (NEEDLE) IMPLANT
NEEDLE SPNL 22GX3.5 QUINCKE BK (NEEDLE) ×3 IMPLANT
NEEDLE TROCAR 18X15 ECHO (NEEDLE) IMPLANT
NEEDLE TROCAR 18X20 (NEEDLE) IMPLANT
NS IRRIG 1000ML POUR BTL (IV SOLUTION) ×3 IMPLANT
PACK CYSTO (CUSTOM PROCEDURE TRAY) ×3 IMPLANT
PAD ABD 8X10 STRL (GAUZE/BANDAGES/DRESSINGS) ×2 IMPLANT
PROBE KIDNEY STONEBRKR 2.0X425 (ELECTROSURGICAL) ×2 IMPLANT
PROBE LITHOCLAST ULTRA 3.8X403 (UROLOGICAL SUPPLIES) ×2 IMPLANT
PROBE PNEUMATIC 1.0MMX570MM (UROLOGICAL SUPPLIES) ×2 IMPLANT
SHEATH PEELAWAY SET 9 (SHEATH) ×3 IMPLANT
SPONGE LAP 4X18 X RAY DECT (DISPOSABLE) ×3 IMPLANT
STENT URET 6FRX24 CONTOUR (STENTS) ×2 IMPLANT
STONE CATCHER W/TUBE ADAPTER (UROLOGICAL SUPPLIES) ×3 IMPLANT
SUT SILK 0 FSL (SUTURE) ×3 IMPLANT
SYR 20CC LL (SYRINGE) ×6 IMPLANT
SYR 50ML LL SCALE MARK (SYRINGE) ×3 IMPLANT
SYRINGE 10CC LL (SYRINGE) ×3 IMPLANT
TOWEL OR 17X26 10 PK STRL BLUE (TOWEL DISPOSABLE) ×3 IMPLANT
TUBING CONNECTING 10 (TUBING) ×6 IMPLANT

## 2015-04-22 NOTE — Anesthesia Preprocedure Evaluation (Addendum)
Anesthesia Evaluation  Patient identified by MRN, date of birth, ID band Patient awake    Reviewed: Allergy & Precautions, H&P , NPO status , Patient's Chart, lab work & pertinent test results  Airway Mallampati: II  TM Distance: >3 FB Neck ROM: full    Dental no notable dental hx. (+) Dental Advisory Given, Teeth Intact   Pulmonary neg pulmonary ROS, former smoker,    Pulmonary exam normal breath sounds clear to auscultation       Cardiovascular Exercise Tolerance: Good hypertension, Pt. on medications Normal cardiovascular exam Rhythm:regular Rate:Normal     Neuro/Psych Alzheimer's disease negative neurological ROS  negative psych ROS   GI/Hepatic negative GI ROS, Neg liver ROS, hiatal hernia, GERD  Medicated and Controlled,  Endo/Other  negative endocrine ROS  Renal/GU negative Renal ROS  negative genitourinary   Musculoskeletal   Abdominal   Peds  Hematology negative hematology ROS (+)   Anesthesia Other Findings   Reproductive/Obstetrics negative OB ROS                            Anesthesia Physical Anesthesia Plan  ASA: III  Anesthesia Plan: General   Post-op Pain Management:    Induction: Intravenous  Airway Management Planned: Oral ETT  Additional Equipment:   Intra-op Plan:   Post-operative Plan: Extubation in OR  Informed Consent: I have reviewed the patients History and Physical, chart, labs and discussed the procedure including the risks, benefits and alternatives for the proposed anesthesia with the patient or authorized representative who has indicated his/her understanding and acceptance.   Dental Advisory Given  Plan Discussed with: CRNA and Surgeon  Anesthesia Plan Comments:         Anesthesia Quick Evaluation

## 2015-04-22 NOTE — Transfer of Care (Signed)
Immediate Anesthesia Transfer of Care Note  Patient: [name]  Procedure(s) Performed: Procedure(s): LEFT PERCUTANEOUS NEPHROLITHOTOMY  WITH HOLMIUM LASER AND STONE EXTRACTION (Left)  Patient Location: PACU  Anesthesia Type:General  Level of Consciousness:  sedated, patient cooperative and responds to stimulation  Airway & Oxygen Therapy:Patient Spontanous Breathing and Patient connected to face mask oxgen  Post-op Assessment:  Report given to PACU RN and Post -op Vital signs reviewed and stable  Post vital signs:  Reviewed and stable  Last Vitals:  Filed Vitals:   04/22/15 1117  BP: 136/80  Pulse: 68  Temp: 36.7 C  Resp: 18    Complications: No apparent anesthesia complications

## 2015-04-22 NOTE — Anesthesia Postprocedure Evaluation (Signed)
  Anesthesia Post-op Note  Patient: [name]  Procedure(s) Performed: Procedure(s) (LRB): LEFT PERCUTANEOUS NEPHROLITHOTOMY  WITH HOLMIUM LASER AND STONE EXTRACTION (Left)  Patient Location: PACU  Anesthesia Type: General  Level of Consciousness: awake and alert   Airway and Oxygen Therapy: Patient Spontanous Breathing  Post-op Pain: mild  Post-op Assessment: Post-op Vital signs reviewed, Patient's Cardiovascular Status Stable, Respiratory Function Stable, Patent Airway and No signs of Nausea or vomiting  Last Vitals:  Filed Vitals:   04/22/15 1930  BP: 119/60  Pulse: 67  Temp: 36.3 C  Resp: 11    Post-op Vital Signs: stable   Complications: No apparent anesthesia complications

## 2015-04-22 NOTE — Progress Notes (Signed)
ANTIBIOTIC CONSULT NOTE - INITIAL  Pharmacy Consult: Fluconazole/Cipro Indication: UTI yeast infection/prophylactic therapy s/p L percutaneous nephrolithotomy  Allergies  Allergen Reactions  PCN  Patient Measurements: Height: 4\' 11"  (149.9 cm) Weight: 155 lb 4 oz (70.421 kg) IBW/kg (Calculated) : 43.2   Vital Signs: Temp: 97.8 F (36.6 C) (10/07 1956) Temp Source: Oral (10/07 1117) BP: 158/81 mmHg (10/07 1956) Pulse Rate: 68 (10/07 1956) Intake/Output from previous day:   Intake/Output from this shift: Total I/O In: 300 [I.V.:300] Out: 125 [Urine:125]  Labs: No results for input(s): WBC, HGB, PLT, LABCREA, CREATININE in the last 72 hours. Estimated Creatinine Clearance: 38.2 mL/min (by C-G formula based on Cr of 0.97). No results for input(s): VANCOTROUGH, VANCOPEAK, VANCORANDOM, GENTTROUGH, GENTPEAK, GENTRANDOM, TOBRATROUGH, TOBRAPEAK, TOBRARND, AMIKACINPEAK, AMIKACINTROU, AMIKACIN in the last 72 hours.   Microbiology: Recent Results (from the past 720 hour(s))  Urine culture     Status: None   Collection Time: 04/18/15  2:54 PM  Result Value Ref Range Status   Specimen Description Ur, Bag ped  Final   Special Requests NONE  Final   Culture   Final    40,000 COLONIES/ml YEAST Performed at [facility]    Report Status 04/20/2015 FINAL  Final    Medical History: Past Medical History  Diagnosis Date  . Depressive disorder, not elsewhere classified   . Anxiety state, unspecified   . Benign neoplasm of colon   . Diverticulitis of colon (without mention of hemorrhage)   . Mixed hyperlipidemia   . Obesity, unspecified   . Chronic rhinitis   . GERD   . INTERTRIGO, CANDIDAL   . Major neurocognitive disorder due to Alzheimer's disease, probable, without behavioral disturbance     Followed by Dr. Brett Fairy at [facility].   Marland Kitchen PRESSURE ULCER STAGE I     sacral, recurrent  . Arthritis   . History of hiatal hernia   . Unspecified essential  hypertension   . Bronchitis     finished antibiotics 04-18-2015  . Skin ulcer ([place])     stage 1 redness to sacrum skin intact per family  . History of bladder infections aug 2016 last uti  . Family history of adverse reaction to anesthesia     family hx ponv    Medications:  Scheduled:  . [START ON 04/23/2015] acetaminophen  1,000 mg Intravenous 4 times per day  . ciprofloxacin  250 mg Oral BID  . [START ON 04/23/2015] citalopram  20 mg Oral Daily  . [START ON 04/23/2015] donepezil  10 mg Oral Daily  . fluconazole  200 mg Oral Daily  . [START ON 04/23/2015] heparin subcutaneous  5,000 Units Subcutaneous 3 times per day  . [START ON 04/23/2015] memantine  28 mg Oral Daily  . metoprolol  2.5 mg Intravenous 4 times per day  . [START ON 04/23/2015] pantoprazole  40 mg Oral QAC breakfast  . polyethylene glycol  17 g Oral Daily   Infusions:  . dextrose 5 % and 0.45 % NaCl with KCl 20 mEq/L     PRN: ALPRAZolam, bisacodyl, hydrALAZINE, LORazepam, menthol-cetylpyridinium, morphine injection, ondansetron (ZOFRAN) IV, ondansetron, opium-belladonna, oxyCODONE, phenol Assessment: 84yoF presenting for follow-up after hospitalization secondary to an infection, failure to thrive and left UPJ stone onstruction. Pt is s/p nephrostomy tube placement and Urine cx grew yeast. She was placed on Fluconazole 200mg  QD x 14 days. Left percutaneous nephrolithotomy was performed today and Cipro started for prophylactic therapy.  Goal of Therapy:  Eradication of infection  Plan:  Change Fluconazole to 100mg  QD due to CrCl=38 ml/min.  Continue Cipro 250mg  BID.  Garnet Sierras 04/22/2015,8:21 PM

## 2015-04-22 NOTE — Anesthesia Procedure Notes (Signed)
Procedure Name: Intubation Date/Time: 04/22/2015 2:55 PM Performed by: Montel Clock Pre-anesthesia Checklist: Patient identified, Emergency Drugs available, Suction available, Patient being monitored and Timeout performed Patient Re-evaluated:Patient Re-evaluated prior to inductionOxygen Delivery Method: Circle system utilized Preoxygenation: Pre-oxygenation with 100% oxygen Intubation Type: IV induction Ventilation: Mask ventilation without difficulty Laryngoscope Size: Mac and 3 Grade View: Grade I Tube type: Oral Tube size: 7.5 mm Number of attempts: 1 Airway Equipment and Method: Stylet Placement Confirmation: ETT inserted through vocal cords under direct vision,  positive ETCO2 and breath sounds checked- equal and bilateral Secured at: 20 cm Tube secured with: Tape Dental Injury: Teeth and Oropharynx as per pre-operative assessment

## 2015-04-22 NOTE — Op Note (Signed)
Pre-operative diagnosis: left renal pelvis stones < 2.0  Post-operative diagnosis: as above   Procedure performed: Left renal access,  left percutaneous nephrolithotomy, left nephrostogram, left ureteral stent placement   Surgeon: Dr. Ardis Hughs   Anesthesia: General  Complications: None  Specimens: The majority of the stones were removed and will be sent to the [facility] for further analysis.  Findings: 1. Access was gained to the previously placed left lower pole nephrostomy tube. 2. A 24 cm 6 French double-J stent was left indwelling in the ureter on the left 3. Patient's nephrostomy tube was removed prior to the patient being awoken.  EBL: Approximately 100 cc  Specimens: stone from collecting system - taken to [facility]  Indication: [name] is a 81 y.o. patient with large left sided stone burden and recurrent UTIs. After reviewing the management options for treatment, he elected to proceed with the above surgical procedure(s). We have discussed the potential benefits and risks of the procedure, side effects of the proposed treatment, the likelihood of the patient achieving the goals of the procedure, and any potential problems that might occur during the procedure or recuperation. Informed consent has been obtained.   Description:  Consent was obtained in the preoperative holding area. The patient was marked appropriately and then taken back to the operating room where she was intubated on the gurney. The patient was flipped prone onto the split leg OR table. Large jelly rolls were placed in the anterior axillary line on both sides allowing the patient's chest and abdomen to fall inbetween. The patient was then prepped and draped in the routine sterile fashion in the right flank and perineal/vaginal area. A timeout was then held confirming the proper side and procedure as well as antibiotics were administered.  A 0.38 sensor wire  was then passed through he wire was through the previously placed left nephrostomy tube and curled in the kidney. I then removed the neph tube pigtail over the wire of the kidney. I then advanced an angiocatheter over the wire to help guide the wire down the ureter into the bladder using fluoroscopic guidance. The angiographic catheter was then advanced into the bladder and the wire removed. A Super Stiff wire was then passed into the angiographic catheter and the angiographic catheter removed. A 9 French peel-away dilator was then advanced over the Super Stiff wire and passed into the renal pelvis and across the UPJ under fluoroscopic guidance. The inner part of this dilator was removed and the 0.38 sensor wire was passed alongside the Super Stiff wire through the dilator and into the left ureter and down into the bladder. The angiographic catheter again was passed over the guidewire and advanced into the bladder, the wire was then removed. A Super Stiff wire was then passed through angiographic catheter and angiographic catheter removed. The outer part of the sheath was then removed, establishing 2 superstiff wires through the lower pole calyx and into the bladder.   The 62 French NephroMax balloon was then passed over one of the Super Stiff wires and the tip guided down into the left lower pole calyx. The balloon was then inflated to approximately 12 atm, and once there was no waist noted under fluoroscopy the access sheath was advanced over the balloon. The balloon was then removed. The wires were then placed back into the sheaths and snapped to the drape.   Using the rigid nephroscope to explore the renal pelvis I encountered several large calculi  which I was able to remove without fragmentation using the 2 prong grasper. I then used the pneumatic stone breaking device to fracture the larger UPJ stone into smaller pieces which I was unable to the removed with the 2 prong graspers. Then using a flexible  cystoscope to navigate the remaining calyces of the kidney multiple smaller stone fragments encountered, mostly in the lower pole calyces.  Using the 0 tip basket these fragments were grabbed and removed. Contrast was injected through the cystoscope and the calyces systematically inspected under fluoroscopic guidance to ensure that all stone fragments had been removed.   Then using the flexible Cystoscope the ureter was navigated in antegrade fashion. All stone fragments were pushed from the ureter into the bladder, however there were 2 larger stone fragments that I removed in the antegrade fashion using an 0 tip basket. The largest stone I was unable to remove prior to fragmenting it using a 365 nm laser fiber with settings of 0.6 J and 6 Hz. Once the ureter was clear the scope was advanced into the bladder and a 0.38 sensor wire was left in the bladder and the scope backed out over wire. The sensor wire was then backloaded over the rigid nephroscope using the stent pusher and a 24 cm x 6 French double-J ureteral stent was passed antegrade over the sensor wire down into the bladder under fluoroscopic guidance. Once the stent was in the bladder the wire was gently pulled back and a nice curl noted in the bladder. The wire completely removed from the stent, and nice curl on the proximal end of the stent was noted in the renal pelvis. The sheath was then backed out slowly to ensure that all calyces had been inspected and there was nothing behind the sheath.   A 24 [place] Ainsworth catheter was then passed over one of the Super Stiff wires through the sheath and into the renal pelvis. The sheath was then backed out of the kidney and cut off the red rubber catheter. A nephrostogram was then performed confirming the position of our nephrostomy tube and reassuring that there were no longer any filling defects from the patient's symptoms. After several minutes of direct pressure and observation was noted that there was  no significant bleeding from the nephrostomy tube or around the nephrostomy tube tract. As such, I remove the nephrostomy tube as well as the safety wire. 25 cc of local anesthesia was then injected into the patient's wound.  Prior to closing the incision I extended the incision slightly inferior to express purulence from an  indurated and edematous area of the wound which I copiously irrigated and excise the dead/infected tissue.  The wound was closed with 3-0 nylon in 2 vertical mattress sutures. The incision was then padded using a bundle of 4 x 4's and Tegaderm. Patient was subsequently rolled over to the supine position and extubated. She was returned to the PACU in excellent condition. At the end of the case all lap and needle and sponges were accounted for. There are no perioperative complications.

## 2015-04-22 NOTE — H&P (Signed)
Reason For Visit Large left-sided stone burden   History of Present Illness 81 year old female who presents today for follow-up after being hospitalized for a infection, failure to thrive, and a obstructing left UPJ stone. She had a nephrostomy tube placed and urine cultures grew yeast. She has been on fluconazole since discharge from the hospital 1 week ago. She is now in a nursing facility. Since her hospitalization she has gotten slightly weaker. She suffers from advanced dementia and immobility. She has not had any issues or complications associated with the nephrostomy tube. She is being treated for 14 days with fluconazole 200 mg daily. There have been no documented fevers.   Past Medical History Problems  1. History of Arthritis 2. History of Asthma (J45.909) 3. History of Blood In Urine 4. History of Calculus of ureter (N20.1) 5. History of heartburn (Z87.898) 6. History of hypercholesterolemia (Z86.39) 7. History of kidney stones (Z87.442) 8. History of kidney stones (Z87.442) 9. History of Peptic Ulcer  Surgical History Problems  1. History of Shoulder Surgery 2. History of Total Hip Replacement  Current Meds 1. ALPRAZolam 0.25 MG Oral Tablet; TAKE 1 TABLET Every 8 hours PRN;  Therapy: (Recorded:07Oct2008) to Recorded 2. Ascriptin 325 MG Oral Tablet; TAKE TABLET Daily;  Therapy: (Recorded:20Oct2008) to Recorded 3. Citalopram Hydrobromide 20 MG Oral Tablet;  Therapy: (Recorded:21Jul2014) to Recorded 4. CloNIDine HCl - 0.1 MG Oral Tablet;  Therapy: (Recorded:07Oct2008) to Recorded 5. Cranberry/Probiotic/Vit C 450-30 MG Oral Tablet; Take 1 tablet daily;  Therapy: 05Aug2016 to (Evaluate:03Mar2017)  Requested for: 05Aug2016; Last  Rx:05Aug2016 Ordered 6. Doxycycline Hyclate 100 MG Oral Capsule; TAKE 1 CAPSULE EVERY 12 HOURS UNTIL  GONE;  Therapy: 08Aug2016 to (Evaluate:15Aug2016); Last Rx:08Aug2016 Ordered 7. Drixoral TB12; prn;  Therapy: (Recorded:18Feb2016) to  Recorded 8. Hyoscyamine Sulfate 0.125 MG Oral Tablet; TAKE TABLET Every 6 hours PRN;  Therapy: (Recorded:20Oct2008) to Recorded 9. Multi-Vitamin TABS;  Therapy: (Recorded:07Oct2008) to Recorded 10. Namzaric CP24;   Therapy: (Recorded:18Feb2016) to Recorded 11. Nasonex 50 MCG/ACT Nasal Suspension;   Therapy: (Recorded:07Oct2008) to Recorded 12. Oxybutynin Chloride 5 MG Oral Tablet; Take 1 tablet three times daily  Requested for:   27Apr2015; Last Rx:27Apr2015 Ordered 13. Pepcid 20 MG Oral Tablet;   Therapy: (Recorded:21Jul2014) to Recorded 14. Potassium Chloride 10 MEQ CPCR;   Therapy: (Recorded:07Oct2008) to Recorded 15. Protonix 40 MG Oral Packet; TAKE MG Twice daily;   Therapy: (Recorded:20Oct2008) to Recorded 16. Triamterene-HCTZ 75-50 MG Oral Tablet; prn;   Therapy: (Recorded:18Feb2016) to Recorded 17. Valtrex 1 GM Oral Tablet; prn;   Therapy: (Recorded:18Feb2016) to Recorded 18. Ventolin HFA 108 (90 Base) MCG/ACT Inhalation Aerosol Solution; prn;   Therapy: (Recorded:18Feb2016) to Recorded  Allergies Medication  1. Penicillins  Family History Problems  1. Family history of Family Health Status Number Of Children   6 adult children 2. Family history of malignant neoplasm (Z80.9) : Mother 3. Family history of Heart Disease 4. Family history of Hypertension 5. Family history of Nephrolithiasis 6. Family history of Renal Disease  Social History Problems  1. Denied: History of Alcohol Use 2. Family history of Death In The Family Father   7, cause unknown 3. Family history of Death In The Family Mother   57, cause unknown 4. Marital History - Currently Married 5. Never smoker 6. Occupation:   retired 61. Denied: History of Tobacco Use  Physical Exam The patient is intermittently somnolent, sitting in a wheelchair. When she does arouse she is disoriented.  She is morbidly obese  Her lungs are clear to  auscultation posteriorly  The patient has an ejection  murmur  Her nephrostomy tube site is clean dry and intact, clear yellow urine is draining from the tube itself.  Her lower extremities are mildly edematous, but symmetric   Results/Data Urine [Data Includes: Last 1 Day]   15Sep2016  COLOR YELLOW   APPEARANCE CLOUDY   SPECIFIC GRAVITY 1.025   pH 6.0   GLUCOSE NEGATIVE   BILIRUBIN NEGATIVE   KETONE NEGATIVE   BLOOD 2+   PROTEIN NEGATIVE   NITRITE NEGATIVE   LEUKOCYTE ESTERASE 2+   SQUAMOUS EPITHELIAL/HPF 0-5 HPF  WBC >60 WBC/HPF  RBC 3-10 RBC/HPF  BACTERIA MANY HPF  CRYSTALS See Below HPF  CASTS NONE SEEN LPF  Yeast FEW HPF   The patient's urine analysis was remained clean catch, a urine culture was sent   Assessment Assessed  1. Left nephrolithiasis (N20.0)  Plan Health Maintenance  1. UA With REFLEX; [Do Not Release]; Status:Complete;   Done: [id number] 04:00PM Urinary tract infection  2. URINE CULTURE; Status:In Progress - Specimen/Data Collected;   Done: [id number]  Discussion/Summary Unfortunate 81 year old with advanced dementia and immobility with a large left-sided stone burden. She is currently on fluconazole for a yeast infection that was cultured from her urine at her last hospitalization. She has tolerated the nephrostomy tube well. She is only slightly weaker, but they slide she has very limited mobility. I discussed the treatment options with her family in quite some detail. We discussed staged shock wave lithotripsy versus percutaneous nephrolithotomy. Ultimately, given the size of her stones, I advised against shockwave lithotripsy. We did discuss PCNL. I went over this procedure in detail with the family. I described to them the expected hospital recovery as well as the associated risks and benefits of the operation. The patient appears to have good access in the lower pole with a nephrostomy tube. I think we could use this to help facilitate the surgery and it would make it significantly quicker. The family is  eager to take care of this as soon as possible. I did caution him that it may be a month before we can get her scheduled. They would like me to look around other urologist see if we can facilitate this sooner.  I did send the patient's urine for culture. She likely will need a second course of fluconazole several days prior to her PCNL.

## 2015-04-23 ENCOUNTER — Ambulatory Visit (HOSPITAL_COMMUNITY): Payer: Medicare Other

## 2015-04-23 DIAGNOSIS — N132 Hydronephrosis with renal and ureteral calculous obstruction: Secondary | ICD-10-CM | POA: Diagnosis not present

## 2015-04-23 DIAGNOSIS — L8990 Pressure ulcer of unspecified site, unspecified stage: Secondary | ICD-10-CM | POA: Insufficient documentation

## 2015-04-23 MED ORDER — ZOLPIDEM TARTRATE 5 MG PO TABS: 5.0000 mg | ORAL_TABLET | Freq: Every evening | ORAL | Status: DC | PRN

## 2015-04-23 NOTE — Evaluation (Signed)
Physical Therapy Evaluation Patient Details Name: [name] MRN: [id number] DOB: 11-01-1930 Today's Date: 04/23/2015   History of Present Illness  81 y.o. female with past medical history significant for obesity, depression, anxiety, Alzheimer's dementia, GERD, sacral decubitus ulcer, diverticular disease, recurrent UTIs was admitted to the hospital on 8/31 with diminished oral intake, lethargy/diminished activity level, and flank pain.  Pt with presumed left pyelonephritis and 2 cm left ureteropelvic junction calculus/moderate hydronephrosis and s/p left percutaneous nephrostomy 9/2 by IR.   Clinical Impression  Patient was able to mobilize with assistance and encouragement of son in law. Patient will benefit from PT to address problems listed in the note below.    Follow Up Recommendations Home health PT, 24/7 assist    Equipment Recommendations  None recommended by PT    Recommendations for Other Services       Precautions / Restrictions Precautions Precautions: Fall Precaution Comments: incontinent Restrictions Weight Bearing Restrictions: No      Mobility  Bed Mobility Overal bed mobility: Needs Assistance Bed Mobility: Supine to Sit;Sit to Supine     Supine to sit: Mod assist;+2 for safety/equipment Sit to supine: Mod assist;+2 for safety/equipment   General bed mobility comments: Mod assist for trunk support and initiation OOB. Mod assist for BLE management back to bed.   Transfers Overall transfer level: Needs assistance Equipment used: Rolling walker (2 wheeled) Transfers: Sit to/from [facility] Sit to Stand: Min assist;+2 safety/equipment Stand pivot transfers: Min assist;+2 safety/equipment       General transfer comment: min assist for safety, cues for technique and initiation during movements/tasks  Ambulation/Gait                Stairs            Wheelchair Mobility    Modified Rankin (Stroke Patients Only)        Balance Overall balance assessment: Needs assistance Sitting-balance support: No upper extremity supported Sitting balance-Leahy Scale: Fair     Standing balance support: Bilateral upper extremity supported;During functional activity Standing balance-Leahy Scale: Fair                               Pertinent Vitals/Pain Pain Assessment: Faces Faces Pain Scale: No hurt    Home Living Family/patient expects to be discharged to:: Private residence Living Arrangements: Spouse/significant other Available Help at Discharge: Family;Available 24 hours/day Type of Home: House         Home Equipment: Bedside commode;Shower seat      Prior Function Level of Independence: Needs assistance      ADL's / Homemaking Assistance Needed: Pt required assistance with bathing & dressing PTA. According to son-in-law, pt was ambulating to/from bathroom with supervision using RW        Hand Dominance   Dominant Hand: Right    Extremity/Trunk Assessment   Upper Extremity Assessment: Defer to OT evaluation           Lower Extremity Assessment: Generalized weakness      Cervical / Trunk Assessment: Kyphotic  Communication   Communication: HOH  Cognition Arousal/Alertness: Awake/alert Behavior During Therapy: Agitated Overall Cognitive Status: History of cognitive impairments - at baseline (son-in-law reports h/o dementia with slow progression. He reports she is "a little" worse than PTA.)                      General Comments      Exercises  Assessment/Plan    PT Assessment Patient needs continued PT services  PT Diagnosis Difficulty walking;Generalized weakness;Altered mental status   PT Problem List Decreased strength;Decreased activity tolerance;Decreased mobility;Decreased cognition;Decreased safety awareness  PT Treatment Interventions DME instruction;Gait training;Functional mobility training;Therapeutic activities;Therapeutic  exercise;Patient/family education   PT Goals (Current goals can be found in the Care Plan section) Acute Rehab PT Goals Patient Stated Goal: to return home per son in law PT Goal Formulation: With family Time For Goal Achievement: 05/07/15    Frequency Min 3X/week   Barriers to discharge        Co-evaluation PT/OT/SLP Co-Evaluation/Treatment: Yes Reason for Co-Treatment: Complexity of the patient's impairments (multi-system involvement);For patient/therapist safety PT goals addressed during session: Mobility/safety with mobility OT goals addressed during session: ADL's and self-care       End of Session   Activity Tolerance: Patient tolerated treatment well Patient left: in bed;with call bell/phone within reach;with bed alarm set;with family/visitor present Nurse Communication: Mobility status    Functional Assessment Tool Used: clinical observation Functional Limitation: Mobility: Walking and moving around Mobility: Walking and Moving Around Current Status (K4628): At least 20 percent but less than 40 percent impaired, limited or restricted Mobility: Walking and Moving Around Goal Status [phone]): At least 1 percent but less than 20 percent impaired, limited or restricted    Time: 1405-1430 PT Time Calculation (min) (ACUTE ONLY): 25 min   Charges:   PT Evaluation $Initial PT Evaluation Tier I: 1 Procedure     PT G Codes:   PT G-Codes **NOT FOR INPATIENT CLASS** Functional Assessment Tool Used: clinical observation Functional Limitation: Mobility: Walking and moving around Mobility: Walking and Moving Around Current Status (R1165): At least 20 percent but less than 40 percent impaired, limited or restricted Mobility: Walking and Moving Around Goal Status [phone]): At least 1 percent but less than 20 percent impaired, limited or restricted    Claretha Cooper 04/23/2015, 4:47 PM

## 2015-04-23 NOTE — Progress Notes (Signed)
1 Day Post-Op Subjective: Did well over night. Is not ambulating, foley draining pink urine without clot.  Objective: Vital signs in last 24 hours: Temp:  [97.3 F (36.3 C)-98.1 F (36.7 C)] 97.6 F (36.4 C) (10/08 0549) Pulse Rate:  [58-128] 61 (10/08 0549) Resp:  [11-20] 20 (10/08 0549) BP: (112-158)/(56-81) 131/60 mmHg (10/08 0549) SpO2:  [94 %-100 %] 97 % (10/08 0549) Weight:  [70.421 kg (155 lb 4 oz)] 70.421 kg (155 lb 4 oz) (10/07 1117)  Intake/Output from previous day: 10/07 0701 - 10/08 0700 In: 3388.3 [I.V.:3188.3; IV Piggyback:200] Out: 0737 [Urine:1875] Intake/Output this shift:    Physical Exam:  General: Alert and oriented CV: RRR Lungs: Clear Abdomen: Soft, ND left PCN access dressing dry and in place GU: foley in place with copious pink urine without clot Ext: NT, No erythema  Lab Results:  Recent Labs  04/22/15 2025  HGB 12.2  HCT 37.2   BMET  Recent Labs  04/22/15 2025  NA 140  K 3.4*  CL 104  CO2 28  GLUCOSE 99  BUN 14  CREATININE 1.04*  CALCIUM 8.6*     Studies/Results: Ct Abdomen Pelvis Wo Contrast  04/23/2015   CLINICAL DATA:  Inpatient with a history of left nephrolithiasis status post left percutaneous nephrolithotomy.  EXAM: CT ABDOMEN AND PELVIS WITHOUT CONTRAST  TECHNIQUE: Multidetector CT imaging of the abdomen and pelvis was performed following the standard protocol without IV contrast.  COMPARISON:  03/16/2015 unenhanced CT abdomen/ pelvis.  FINDINGS: Lower chest: Trace layering bilateral pleural effusions with associated segmental passive atelectasis in both lower lobes. Re- demonstrated are left anterior descending, left circumflex and right coronary artery calcifications.  Hepatobiliary: Stable granulomatous calcification in the anterior inferior liver. Otherwise normal liver, with no liver mass. Status post cholecystectomy. Stable mild central intrahepatic biliary ductal dilatation and common bile duct diameter of 10 mm, within  expected post cholecystectomy limits.  Pancreas: Atrophic and fatty infiltrated pancreas, with no pancreatic mass or duct dilation.  Spleen: Normal size. No mass.  Adrenals/Urinary Tract: Normal adrenals. Left nephroureteral stent appears well positioned with the proximal pigtail portion in the left renal pelvis and the distal pigtail portion in the left bladder lumen. Mild fullness of the left renal collecting system without overt left hydronephrosis, significantly improved in the interval. No right hydronephrosis. Stable small right extrarenal pelvis. There is a faint 3 mm density in the left lower renal collecting system, which could represent a tiny residual stone. Otherwise no residual renal stones. No contour deforming renal mass. There is expected gas in the upper left renal collecting system and posterior lower left perinephric space status post percutaneous nephrolithotomy, with no perinephric fluid collection. The bladder is poorly visualized due to streak artifact with no gross bladder abnormality detected. Foley catheter is in place within the bladder lumen with tiny focus of nondependent bladder lumen gas likely due to instrumentation.  Stomach/Bowel: Grossly normal stomach. Normal caliber small bowel with no small bowel wall thickening. Normal appendix. Marked sigmoid diverticulosis, with scattered diverticulosis throughout the remaining colon, with no large bowel wall thickening or pericolonic fat stranding. There is a new rectocele/rectal diverticulum extending into the left ischial rectal fat (series 2/images 81-84), likely reflecting pelvic floor laxity. No rectal wall thickening.  Vascular/Lymphatic: Atherosclerotic nonaneurysmal abdominal aorta. No pathologically enlarged lymph nodes in the abdomen or pelvis.  Reproductive: Status posthysterectomy, with no abnormal findings at the vaginal cuff. Stable coarse left adnexal calcifications with no adnexal mass.  Other: No pneumoperitoneum,  ascites or  focal fluid collection.  Musculoskeletal: No aggressive appearing focal osseous lesions. Severe degenerative changes in the visualized thoracolumbar spine and bilateral sacroiliac joints. Status post bilateral total hip arthroplasty. Stable small fat containing umbilical hernia.  IMPRESSION: 1. Well-positioned left nephroureteral stent. No residual hydronephrosis. Faint 3 mm density in the lower left renal collecting system could represent a tiny residual stone. Otherwise no nephrolithiasis. 2. Trace bilateral pleural effusions with bibasilar atelectasis. 3. New rectocele / rectal diverticulum extending into the left ischiorectal fat, likely reflecting pelvic floor laxity. No rectal wall thickening. 4. Marked colonic diverticulosis. 5. Atherosclerosis, including three-vessel coronary artery disease. Please note that although the presence of coronary artery calcium documents the presence of coronary artery disease, the severity of this disease and any potential stenosis cannot be assessed on this non-gated CT examination.   Electronically Signed   By: Ilona Sorrel M.D.   On: 04/23/2015 09:17   Dg Abd 1 View  04/22/2015   CLINICAL DATA:  Left nephrolithiasis. Left percutaneous nephrolithotomy with stone extraction.  EXAM: ABDOMEN - 1 VIEW  COMPARISON:  CT 03/16/2015.  FINDINGS: Two C-arm images show nephrostomy tube entering the inferior pole collecting system. Contrast is injected and demonstrates filling defects without obstruction. Second image shows scope in place with wires extending down the ureter.  IMPRESSION: Intra procedural studies for nephrolithotomy and stone extraction.   Electronically Signed   By: Nelson Chimes M.D.   On: 04/22/2015 17:53   Dg C-arm 61-120 Min-no Report  04/22/2015   CLINICAL DATA: surgery   C-ARM 61-120 MINUTES  Fluoroscopy was utilized by the requesting physician.  No radiographic  interpretation.     Assessment/Plan: 81 y.o. female s/p left PCNL on 04/22/15 with dementia at  baseline as well as poor mobility and decubitus ulcer. The patient is stone free on CT from 04/23/15.  - Regular diet - Med lock - d/c foley - ambulate with assistance, PT consult for home recs - Wound consult for decub - Case worker for placement     Christell Faith 04/23/2015, 10:31 AM

## 2015-04-23 NOTE — Evaluation (Signed)
Occupational Therapy Evaluation Patient Details Name: MADDISEN VOUGHT MRN: [id number] DOB: 1931-06-07 Today's Date: 04/23/2015    History of Present Illness 81 y.o. female with past medical history significant for obesity, depression, anxiety, Alzheimer's dementia, GERD, sacral decubitus ulcer, diverticular disease, recurrent UTIs was admitted to the hospital on 8/31 with diminished oral intake, lethargy/diminished activity level, and flank pain.  Pt with presumed left pyelonephritis and 2 cm left ureteropelvic junction calculus/moderate hydronephrosis and s/p left percutaneous nephrostomy 9/2 by IR.    Clinical Impression   Patient presenting with deconditioning secondary to above. Patient supervision for functional mobility, but total assist for bathing & dressing PTA. Patient currently functioning at a min assist level for functional mobility and is at baseline for ADLs. Patient will benefit from acute OT to increase overall independence in the areas of ADLs, functional mobility, and overall safety in order to safely discharge home with 24/7 supervision/assistance from family.    OT goals set for functional mobility/transfers and dynamic standing balance since patient at baseline for ADLs.    Follow Up Recommendations  No OT follow up;Supervision/Assistance - 24 hour    Equipment Recommendations  None recommended by OT    Recommendations for Other Services  None at this time   Precautions / Restrictions Precautions Precautions: Fall Restrictions Weight Bearing Restrictions: No    Mobility Bed Mobility Overal bed mobility: Needs Assistance Bed Mobility: Supine to Sit;Sit to Supine     Supine to sit: Mod assist;+2 for safety/equipment Sit to supine: Mod assist;+2 for safety/equipment   General bed mobility comments: Mod assist for trunk support and initiation OOB. Mod assist for BLE management back to bed.   Transfers Overall transfer level: Needs assistance Equipment used:  Rolling walker (2 wheeled) Transfers: Sit to/from [facility] Sit to Stand: Min assist;+2 safety/equipment Stand pivot transfers: Min assist;+2 safety/equipment       General transfer comment: min assist for safety, cues for technique and initiation during movements/tasks    Balance Overall balance assessment: Needs assistance Sitting-balance support: No upper extremity supported;Feet supported Sitting balance-Leahy Scale: Fair     Standing balance support: Bilateral upper extremity supported;During functional activity Standing balance-Leahy Scale: Fair    ADL Overall ADL's : Needs assistance/impaired General ADL Comments: Pt is at baseline for bathing & dressing (total assist). Pt is min to mod assist with functional transfers and mobility due to generalized weakness and dementia which limits her ability to perform tasks appropriately. Pt refused 3-in-1 transfer during this OT/PT co-eval.     Pertinent Vitals/Pain Pain Assessment: Faces Faces Pain Scale: No hurt     Hand Dominance Right   Extremity/Trunk Assessment Upper Extremity Assessment Upper Extremity Assessment: Generalized weakness   Lower Extremity Assessment Lower Extremity Assessment: Generalized weakness   Cervical / Trunk Assessment Cervical / Trunk Assessment: Kyphotic   Communication Communication Communication: HOH   Cognition Arousal/Alertness: Awake/alert Behavior During Therapy: Agitated Overall Cognitive Status: History of cognitive impairments - at baseline (son-in-law reports h/o dementia with slow progression. He reports she is "a little" worse than PTA.)              Home Living Family/patient expects to be discharged to:: Private residence Living Arrangements: Spouse/significant other Available Help at Discharge: Family;Available 24 hours/day Type of Home: House Bathroom Shower/Tub: Walk-in shower   Bathroom Toilet: Handicapped height     Home Equipment: Bedside  commode;Shower seat   Prior Functioning/Environment Level of Independence: Needs assistance    ADL's / Homemaking Assistance Needed:  Pt required assistance with bathing & dressing PTA. According to son-in-law, pt was ambulating to/from bathroom with supervision using RW Communication / Swallowing Assistance Needed: confabulation due to dementia      OT Diagnosis: Generalized weakness   OT Problem List: Decreased activity tolerance;Impaired balance (sitting and/or standing);Decreased safety awareness;Decreased knowledge of use of DME or AE   OT Treatment/Interventions: Self-care/ADL training;Therapeutic exercise;Energy conservation;DME and/or AE instruction;Therapeutic activities;Patient/family education;Balance training    OT Goals(Current goals can be found in the care plan section) Acute Rehab OT Goals Patient Stated Goal: none stated OT Goal Formulation: With family Time For Goal Achievement: 05/07/15 Potential to Achieve Goals: Fair ADL Goals Pt Will Perform Grooming: with supervision;standing Pt Will Transfer to Toilet: with supervision;ambulating;bedside commode Pt Will Perform Tub/Shower Transfer: Shower transfer;shower seat;ambulating;rolling walker;with supervision Additional ADL Goal #1: Pt will be supervision for functional ambulation using RW  OT Frequency: Min 2X/week   Barriers to D/C: None known at this time       Co-evaluation PT/OT/SLP Co-Evaluation/Treatment: Yes Reason for Co-Treatment: For patient/therapist safety;Complexity of the patient's impairments (multi-system involvement)   OT goals addressed during session: ADL's and self-care      End of Session Equipment Utilized During Treatment: Rolling walker  Activity Tolerance: Treatment limited secondary to agitation Patient left: in bed;with call bell/phone within reach;with family/visitor present   Time: 5397-6734 OT Time Calculation (min): 24 min Charges:  OT General Charges $OT Visit: 1  Procedure OT Evaluation $Initial OT Evaluation Tier I: 1 Procedure G-Codes: OT G-codes **NOT FOR INPATIENT CLASS** Functional Limitation: Mobility: Walking and moving around Mobility: Walking and Moving Around Current Status (L9379): At least 20 percent but less than 40 percent impaired, limited or restricted Mobility: Walking and Moving Around Goal Status [id number]): At least 1 percent but less than 20 percent impaired, limited or restricted  Welborn Keena , MS, OTR/L, CLT Pager: [phone]  04/23/2015, 3:41 PM

## 2015-04-23 NOTE — Progress Notes (Signed)
PT Cancellation Note  Patient Details Name: [name] MRN: [id number] DOB: 02-Sep-1930   Cancelled Treatment:    Reason Eval/Treat Not Completed: Patient's daughter  Declined stating that  They are tired and need a nap. RN aware. PT will check back this  PM.  Marcelino Freestone PT [phone]  04/23/2015, 11:26 AM

## 2015-04-24 DIAGNOSIS — N132 Hydronephrosis with renal and ureteral calculous obstruction: Secondary | ICD-10-CM | POA: Diagnosis not present

## 2015-04-24 NOTE — Progress Notes (Signed)
Patient with stage 1 ulcer with old  Peeled skin. No drainage noted, family educated on skin  Care at home to prevent skin breakdown or ulcer.

## 2015-04-24 NOTE — Consult Note (Signed)
WOC wound consult note Reason for Consult:Stage 1 Pressure ulcer Wound type: Pressure plus moisture Pressure Ulcer POA: Yes Measurement: afftected area measures 6cm x 8cm with two areas of partial thickness tissue loss measuring less than 0.2cm round. Wound bed: pink, moist Drainage (amount, consistency, odor) Scant serous Periwound:erythematous, blanching now Dressing procedure/placement/frequency: Dr. Louis Meckel has spoken to family regarding pressure ulcer prevention (specifically turning side to side and avoiding the supine position).  I will provide a pressure redistribution chair pad for her use when OOB in the chair.  While here, timely incontinence care (indwelling urinary catheter was removed today and bedside RN reports UI) using our house pH balanced, no rinse cleanser topped with a clear moisture barrier ointment (Critic Aid Clear). Patient is pending discharge to a skilled facility. [facility] nursing team will not follow, but will remain available to this patient, the nursing and medical team.  Please re-consult if needed. Thanks, Cristina Flakes, MSN, RN, [place], [place], [place] [phone])

## 2015-04-24 NOTE — Progress Notes (Signed)
RNCM contacted CSW to meet with pt POA [name]) concerning current concerns about pt returning home  CSW met with family and discuss their concerns: they are worried about pt husband proper participation in home care and are concerned that he will not be compliant unless a medical profession explains the importance of the pt medical care to him.    CSW discussed family concerns and provided support concerning transition home- CSW discussed private home care with pt and asked RNCM to provide family with list- they express interest in having additional help for pt during the day especially in the morning and night when pt has medication needs etc  CSW also discussed possibility of having social worker come into the home and help family working with pt spouse more on adjusting his schedule to care for the patient properly- family is very interested in having in home social work consult.  Pt family expressed no further need for CSW involvement- RNCM to follow up with family and pt spouse concerning home health needs.  Domenica Reamer, [place] Social Worker [phone]

## 2015-04-24 NOTE — Progress Notes (Signed)
Discharge teaching given to pt and family including activity, diet, medications, and follow-up appts. Family verbalized understanding of all discharge instructions. IV access was d/c'd. Pt escorted out by NT, driven home by family.

## 2015-04-24 NOTE — Care Management Note (Addendum)
Case Management Note  Patient Details  Name: [name] MRN: [id number] Date of Birth: 1931-03-22  Subjective/Objective:                  Kidney stone  Action/Plan: Discharge planning  Expected Discharge Date:    04/24/15              Expected Discharge Plan:  [place]  In-House Referral:  Clinical Social Work  Discharge planning Services  CM Consult  Post Acute Care Choice:  Home Health Choice offered to:  Adult Children  DME Arranged:  N/A DME Agency:  NA  HH Arranged:  PT, RN, HHA and SW HH Agency:  [facility]  Status of Service:  Completed, signed off  Medicare Important Message Given:    Date Medicare IM Given:    Medicare IM give by:    Date Additional Medicare IM Given:    Additional Medicare Important Message give by:     If discussed at [place] of Stay Meetings, dates discussed:    Additional Comments: CM spoke with 2 of patient's daughters at the bedside. They have selected [facility] for home health PT. They are also requesting HHA, RN and SW. Daughters states their father will need assistance with getting the patient out of bed at home. One daughter lives in [place], the other is works full-time with long hours and is unable to be available at all times. CM notified Dr. Louis Meckel of request for home health services outside of PT's suggestion. They also requested to speak with a CSW. CSW made aware of family's request. Tiffany at [facility] notified of home health request. CM will provide family with a list of private pay agencies for PCA services. Apolonio Schneiders, RN 04/24/2015, 10:21 AM

## 2015-04-24 NOTE — Discharge Summary (Signed)
Date of admission: 04/22/2015  Date of discharge: 04/24/2015  Admission diagnosis: nephrolithiasis  Discharge diagnosis: same  Secondary diagnoses: Dementia, poor mobility, Decubitus ulcer present all present on admission  History and Physical: For full details, please see admission history and physical. Briefly, [name] is a 81 y.o. year old patient with nephrolithiasis, here for PCNL.   Hospital Course: The patient underwent PCNL on 04/22/15. The patient tolerated the procedure well. She has poor mobility and dementia at baseline and was seen by PT on POD#1. The patient recovered well and was at or better than baseline on POD#2. The patient was discharged with home health for PT and aide per recommendations. The patient was afebrile and her antifungals were stopped. The patient did not require narcotic pain medications on discharge and was sent home without a prescription for this type of medication. Post op CT showed the patient to be stone free.   Laboratory values:  Recent Labs  04/22/15 2025  HGB 12.2  HCT 37.2    Recent Labs  04/22/15 2025  CREATININE 1.04*    Disposition: Home  Discharge instruction: The patient was instructed to be ambulatory but told to refrain from heavy lifting, strenuous activity, or driving.   Discharge medications:    Medication List    TAKE these medications        acetaminophen 325 MG tablet  Commonly known as:  TYLENOL  Take 2 tablets (650 mg total) by mouth every 6 (six) hours as needed for mild pain (or Fever >/= 101).     ALPRAZolam 0.25 MG tablet  Commonly known as:  XANAX  Take one table by mouth 3 times daily as needed for agitation or insomnia.     aspirin 325 MG EC tablet  Take 325 mg by mouth daily.     citalopram 20 MG tablet  Commonly known as:  CELEXA  Take 1 tablet (20 mg total) by mouth daily.     feeding supplement (ENSURE ENLIVE) Liqd  Take 237 mLs by mouth 2 (two) times daily between meals.     fluconazole 200 MG tablet  Commonly known as:  DIFLUCAN  Take 200 mg by mouth once.     fluticasone 50 MCG/ACT nasal spray  Commonly known as:  FLONASE  Place 1 spray into both nostrils 2 (two) times daily as needed for allergies or rhinitis.     Memantine HCl-Donepezil HCl 28-10 MG Cp24  Commonly known as:  NAMZARIC  Take 1 tablet by mouth daily.     mometasone 50 MCG/ACT nasal spray  Commonly known as:  NASONEX  Place 1-2 sprays into the nose 2 (two) times daily.     multivitamin with minerals Tabs tablet  Take 1 tablet by mouth daily.     oxybutynin 5 MG tablet  Commonly known as:  DITROPAN  Take 1 tablet (5 mg total) by mouth 3 (three) times daily.     pantoprazole 40 MG tablet  Commonly known as:  PROTONIX  Take 1 tablet (40 mg total) by mouth daily before breakfast.     polyethylene glycol packet  Commonly known as:  MIRALAX / GLYCOLAX  Take 17 g by mouth daily.     polyvinyl alcohol 1.4 % ophthalmic solution  Commonly known as:  LIQUIFILM TEARS  Place 1 drop into both eyes daily as needed for dry eyes.     potassium chloride SA 20 MEQ tablet  Commonly known as:  KLOR-CON M20  Take 1 tablet (20 mEq  total) by mouth daily.     protective barrier Crea  Apply twice daily to upper buttocks        Followup:      Follow-up Information    Follow up with [facility].   Why:  Home Health Physical Therapy   Contact information:   [address] [place] [address] [phone]

## 2015-04-25 ENCOUNTER — Encounter (HOSPITAL_COMMUNITY): Payer: Self-pay | Admitting: Urology

## 2015-04-25 ENCOUNTER — Telehealth: Payer: Self-pay | Admitting: Family

## 2015-04-25 NOTE — Telephone Encounter (Signed)
Date of admission: 04/22/2015  Date of discharge: 04/24/2015   Admission/Discharge diagnosis: nephrolithiasis  Secondary diagnoses: Dementia, poor mobility, Decubitus ulcer present all present on admission  Hospital follow up scheduled:  Wednesday, April 27, 2015 @ 1:15 pm with Debbrah Alar, NP   Information below provided by patient's husband.    Transition Care Management Follow-up Telephone Call  How have you been since you were released from the hospital? Husband states patient has been doing fair since discharge.  She's pretty week, but is able to get up and about. Afebrile.  No complaints.  During the time of call, [facility] nurse was there assessing patient.     Do you understand why you were in the hospital? yes   Do you understand the discharge instructions? yes  Items Reviewed:  Medications reviewed: yes  Allergies reviewed: yes  Dietary changes reviewed: yes, husband states she drinks Ensure. Otw poor appetite.    Referrals reviewed: yes, [facility]   Functional Questionnaire:   Activities of Daily Living (ADLs):   She states they are independent in the following: Requires assistance States they require assistance with the following: Requires assistance with ADLs.     Any transportation issues/concerns?: no, husband provides transportation.    Any patient concerns? no   Confirmed importance and date/time of follow-up visits scheduled: yes   Confirmed with patient if condition begins to worsen call PCP or go to the ER: yes

## 2015-04-25 NOTE — Telephone Encounter (Signed)
Please contact pt to arrange TCM follow up.  

## 2015-04-27 ENCOUNTER — Telehealth: Payer: Self-pay | Admitting: Family

## 2015-04-27 ENCOUNTER — Telehealth: Payer: Self-pay

## 2015-04-27 ENCOUNTER — Ambulatory Visit: Payer: Medicare Other | Admitting: Family

## 2015-04-27 NOTE — Telephone Encounter (Signed)
Yes, I will be provider of record. OK to give verbal order.

## 2015-04-27 NOTE — Telephone Encounter (Signed)
Health care POA called states patient has had a decline in her condition since hospital discharge.States patient has stopped eating,lethargic and has had a mental status change.Would like to know if Hospice can evaluate patient. Given verbal order based on information from patients last OV.

## 2015-04-27 NOTE — Telephone Encounter (Signed)
Amy from Hospice called needing to know will [name]  be the attending on Patients admit to hospice care  She was  Referred to them by  [facility] social worker.They  need an order and a verbal pls call Amy @ [phone]

## 2015-04-27 NOTE — Telephone Encounter (Signed)
error:[id number] ° °

## 2015-04-28 NOTE — Telephone Encounter (Signed)
Verbal order called to Amy.  No further needs/concerns voiced.

## 2015-04-29 ENCOUNTER — Telehealth: Payer: Self-pay | Admitting: Family

## 2015-04-29 MED ORDER — LORAZEPAM 0.5 MG PO TABS: 0.5000 mg | ORAL_TABLET | ORAL | Status: AC | PRN

## 2015-04-29 NOTE — Telephone Encounter (Signed)
Yes, OK for hospice MD to sign DNR.  OK to send rx for lorazepam 0.5mg  Q4h prn #30

## 2015-04-29 NOTE — Telephone Encounter (Signed)
[name] with [facility] Ph# [phone]  Family requesting DNR. Is it ok for [facility] physician to sign it? Also pt having agitation. Xanax is not effective. Can they get RX for Lorazepam 0.5mg  Q4H prn. Using [facility]  on [address] for pharmacy.

## 2015-04-29 NOTE — Telephone Encounter (Signed)
Notified Pam at [facility] and Rx called to [facility] at [place].

## 2015-05-02 ENCOUNTER — Telehealth: Payer: Self-pay | Admitting: Family

## 2015-05-02 NOTE — Telephone Encounter (Signed)
Caller name:Tanya  Relation to pt: RN from hospice palliative care Call back number: [phone]  Reason for call:  Requesting verbal orders for DNR

## 2015-05-02 NOTE — Telephone Encounter (Signed)
Left message on Tanya's voicemail at hospice that per 04/29/15 phone note authorization was given to [facility], ok to proceed with DNR and ok to have Hospice MD to sign it.

## 2015-05-09 ENCOUNTER — Telehealth: Payer: Self-pay | Admitting: Neurology

## 2015-05-09 NOTE — Telephone Encounter (Signed)
I do not have additional recommendations I think that this is the natural progression and end-stage of her dementia. She is likely impaired to a degree but she doesn't understand some simple, arms and probably has trouble finding the right words if she should be able to answer question.  in her daughter's place, I would allow her to eat or drink as little as she wants. Hospice will certainly give her similar information. No force-feeding, the patient cannot be motivated she cannot be motivated.  Davyn Morandi, MD

## 2015-05-09 NOTE — Telephone Encounter (Signed)
Pt was admitted to hospital recently, stent placed in kidney, went to SNF, had surgery to take out kidney stones, now doesn't want to get out of bed or work with [facility], not eating, started talking to deceased relatives, referral to hospice has been placed.  Pt refusing to get out of bed and work with home health physical therapy. In pt's office visit in 12/2014, it was noted that the pt has severe dementia.  Pt's daughter wants to know if Dr. Brett Fairy is surprised that pt is not getting out of bed, refusing to work with PT, etc. Hospice is already involved with the pt. She is wanting to know if Dr. Brett Fairy has any recommendations for the pt.   I advised pt's daughter that I would call her back with any recommendations from Dr. Brett Fairy.

## 2015-05-09 NOTE — Telephone Encounter (Signed)
Pt's daughter called sts she is in home hospice care. She would like to touch base with RN regarding pt's condition. Please call at [phone] before 5pm any day and  [phone] after 5pm.

## 2015-05-10 NOTE — Telephone Encounter (Signed)
I advised pt's daughter of Dr. Edwena Felty thoughts below. Pt's daughter verbalized understanding and gratitude.

## 2015-05-10 NOTE — Telephone Encounter (Signed)
Called and left a message for pt's daughter, Daryll Drown at her work number listed below, asking her to call me back.

## 2015-05-12 ENCOUNTER — Ambulatory Visit: Payer: Medicare Other | Admitting: Nurse Practitioner

## 2015-05-31 ENCOUNTER — Telehealth: Payer: Self-pay | Admitting: *Deleted

## 2015-05-31 NOTE — Telephone Encounter (Signed)
I signed and forwarded to Dr. Charlett Blake for cosignature.

## 2015-05-31 NOTE — Telephone Encounter (Signed)
-----   Message from Quintin Alto sent at 05/31/2015 11:00 AM EST ----- Regarding: [facility]: [place] from [place] called stating that she is waiting for POC to be signed for this patient. Her number is [phone]. Thanks.

## 2015-05-31 NOTE — Telephone Encounter (Signed)
Melissa-- I think this may be in your red folder to sign.

## 2015-06-02 NOTE — Telephone Encounter (Signed)
Faxed and sent for scanning. 

## 2015-06-23 ENCOUNTER — Telehealth: Payer: Self-pay | Admitting: Family

## 2015-06-24 NOTE — Telephone Encounter (Signed)
Delivered to provider

## 2015-07-17 NOTE — Telephone Encounter (Signed)
Caller name: Lavada Mesi  Can be reached: [id number]   Reason for call: Bevelyn Buckles will drop Death Certificate off today and hope to pick up on 11-01-22 afternoon. Date of Death for patient was 07-04-2015. (Informed that Melissa was off today)

## 2015-07-17 NOTE — Telephone Encounter (Signed)
Certificate of Death form was dropped off and forwarded to [facility] due to NP not being in the office.

## 2015-07-17 NOTE — Telephone Encounter (Signed)
Noted  

## 2015-07-17 DEATH — deceased

## 2017-02-20 IMAGING — CR DG CHEST 2V
2 series · 2 of 2 positions shown · non-contrast
Comparison: 08/23/2014

CLINICAL DATA: Somnolence.  Alzheimer's disease.  Depression.

EXAM:
CHEST  2 VIEW

[w chest ap]
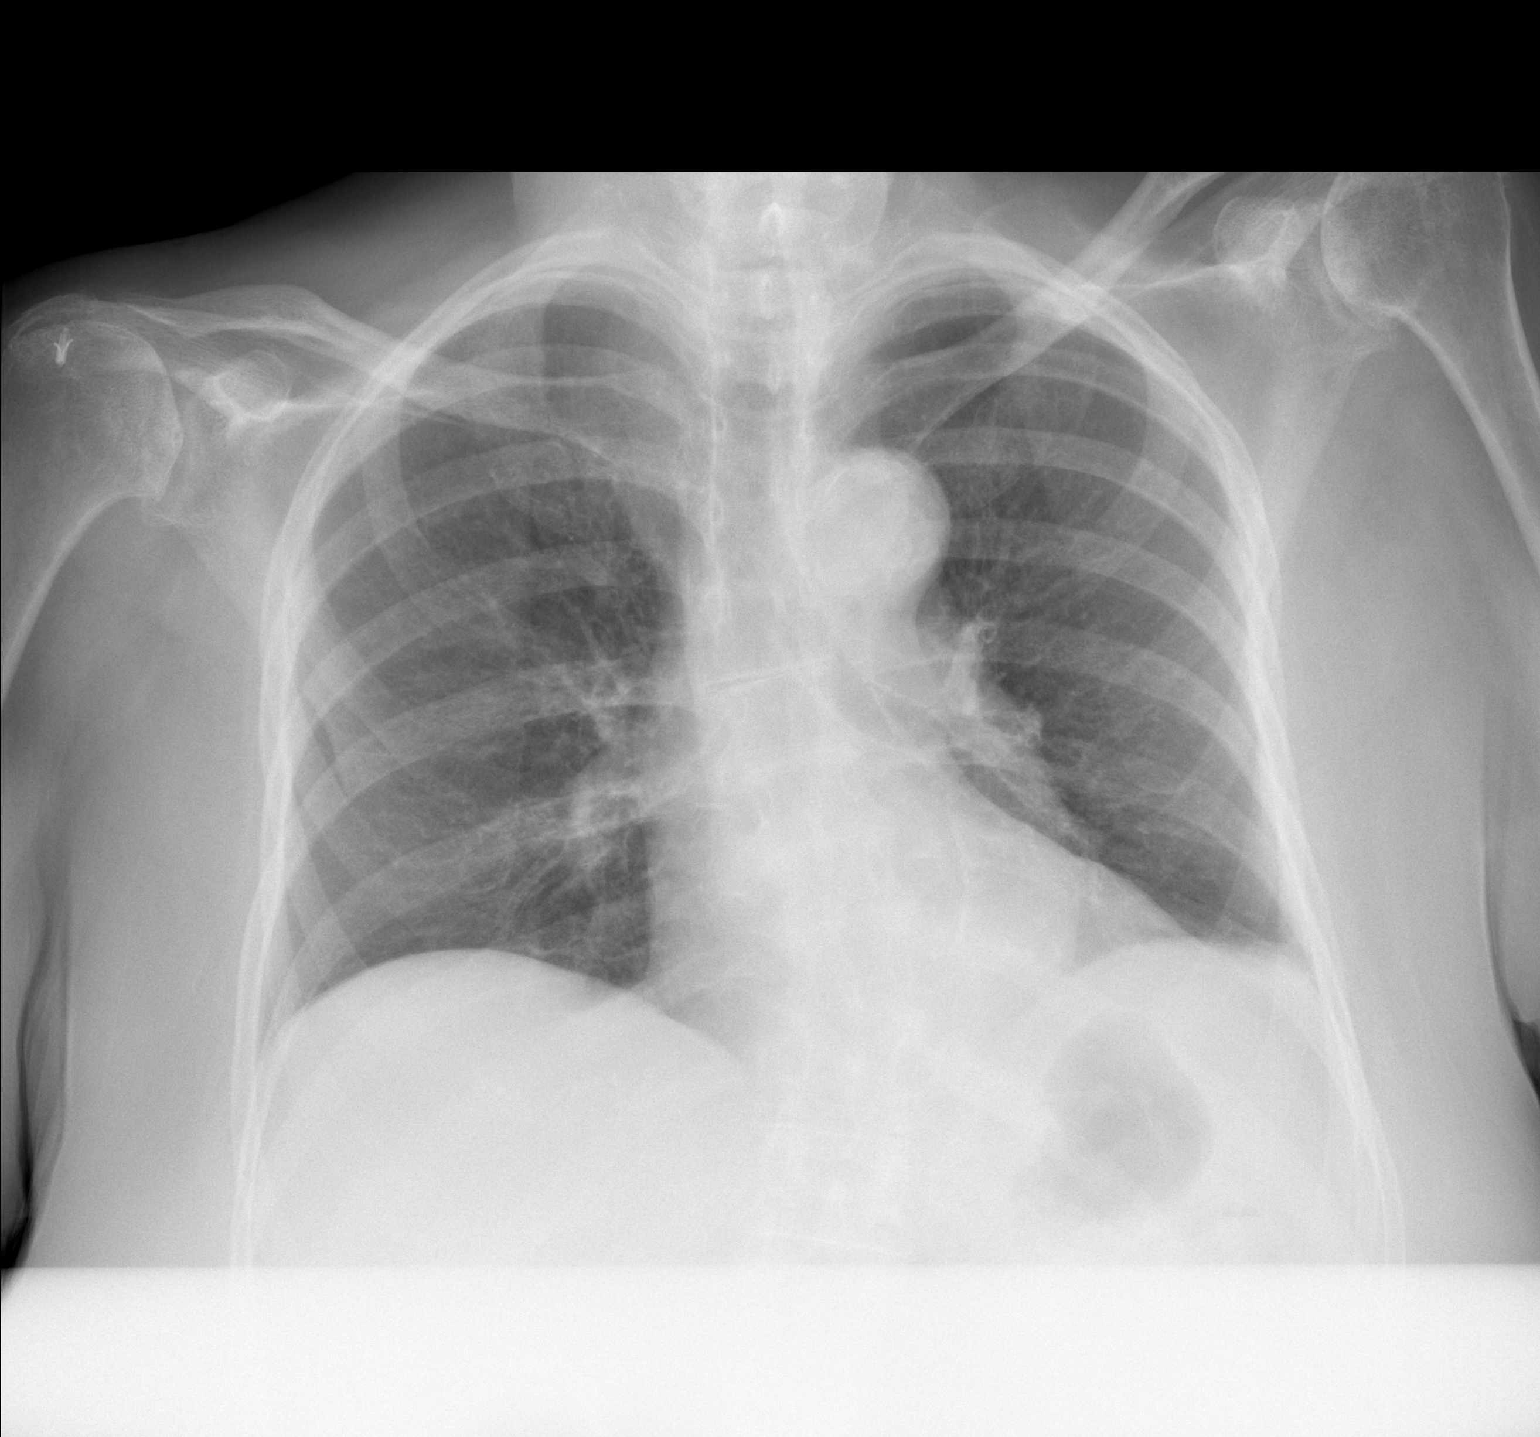

[w chest lat]
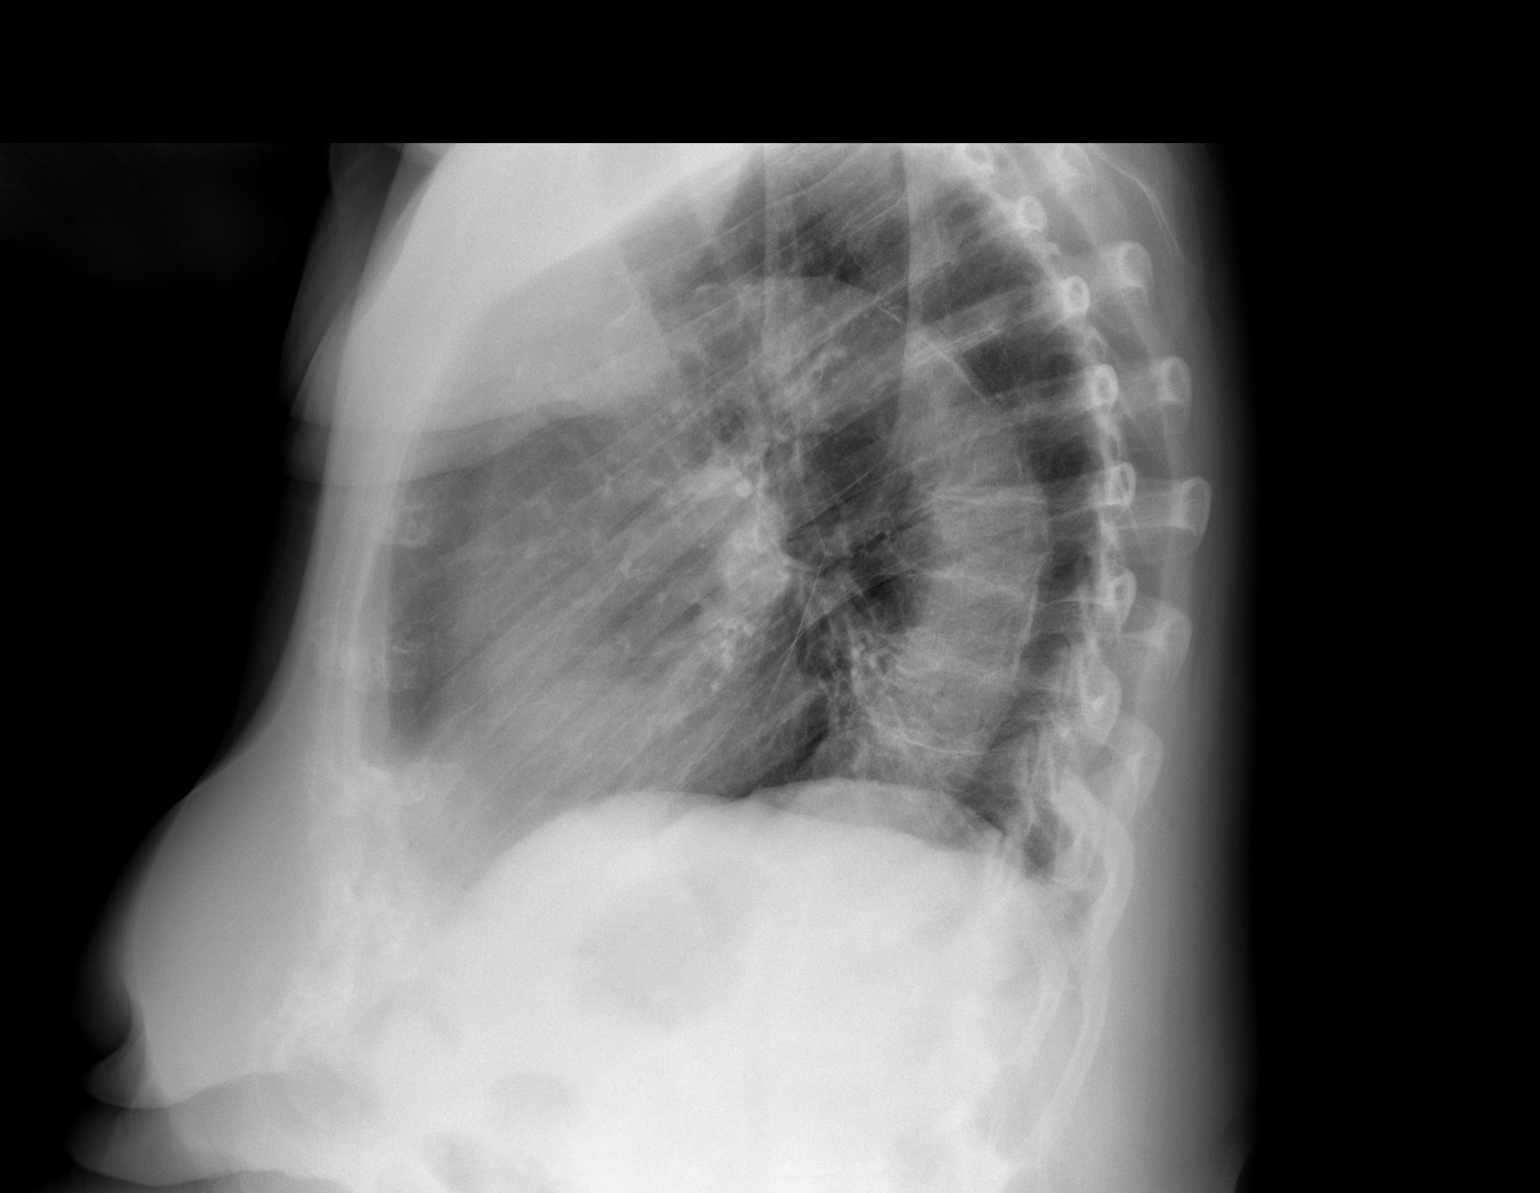

[2 of 2 positions shown; findings below may reference images not displayed]

FINDINGS: There is no focal parenchymal opacity. There is no pleural effusion
or pneumothorax. The heart and mediastinal contours are
unremarkable.

There is prior right rotator cuff repair. There is moderate
osteoarthritis of bilateral glenohumeral joints.
IMPRESSION: No active cardiopulmonary disease.

## 2017-03-06 IMAGING — CT CT ABD-PELV W/O CM
2 of 4 series · 17 of 46 positions shown, 19 images · non-contrast
Comparison: 01/27/2008

CLINICAL DATA: Pyelonephritis

EXAM:
CT ABDOMEN AND PELVIS WITHOUT CONTRAST
TECHNIQUE: Multidetector CT imaging of the abdomen and pelvis was performed
following the standard protocol without IV contrast.

[Series 2: rtn a/p w/o · axial · non-contrast · 0.78mm/px · z∈[-506,-76]mm · 14 of 94 slices shown, 16 images]
[im 4/94  soft-tissue]
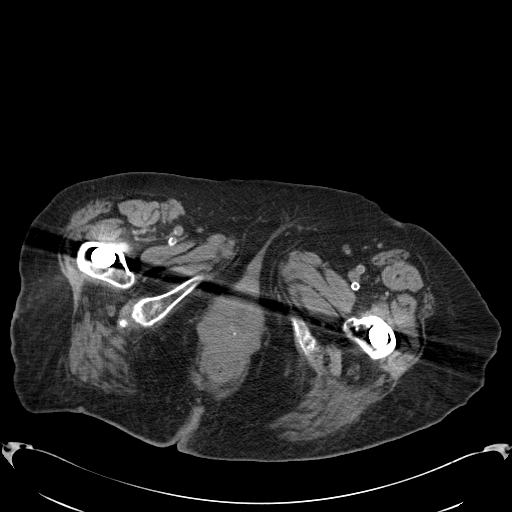
[im 4/94  bone]
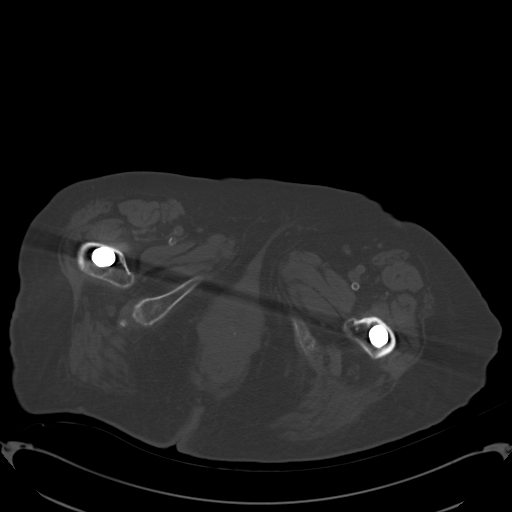
[im 12/94  soft-tissue]
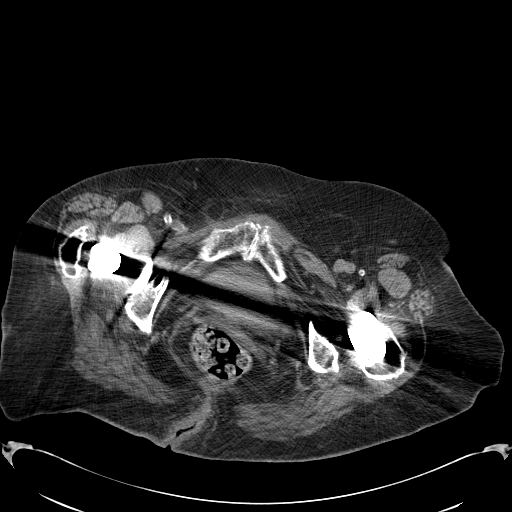
[im 19/94  soft-tissue]
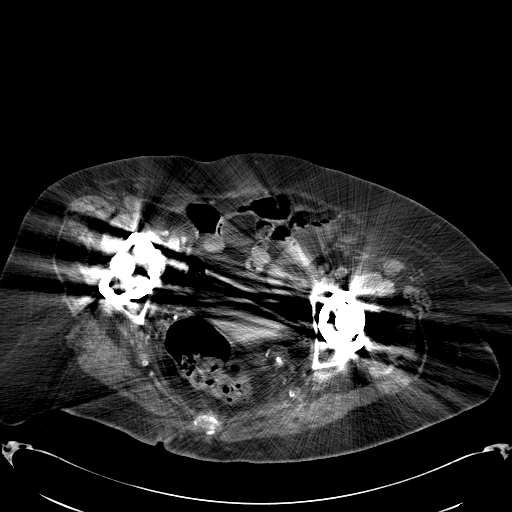
[im 27/94  soft-tissue]
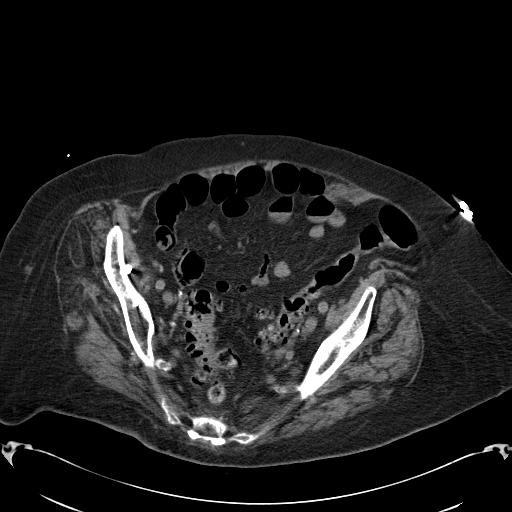
[im 30/94  soft-tissue]
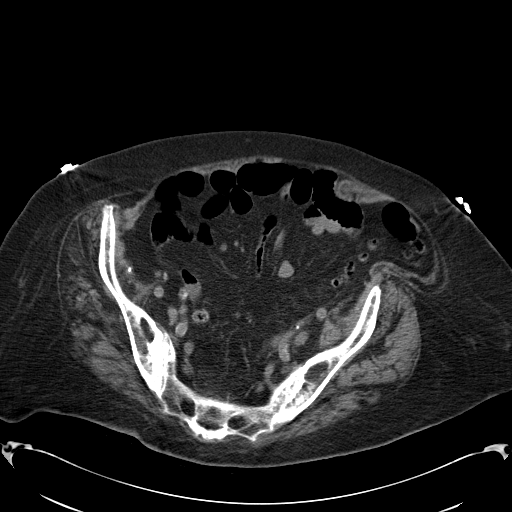
[im 38/94  soft-tissue]
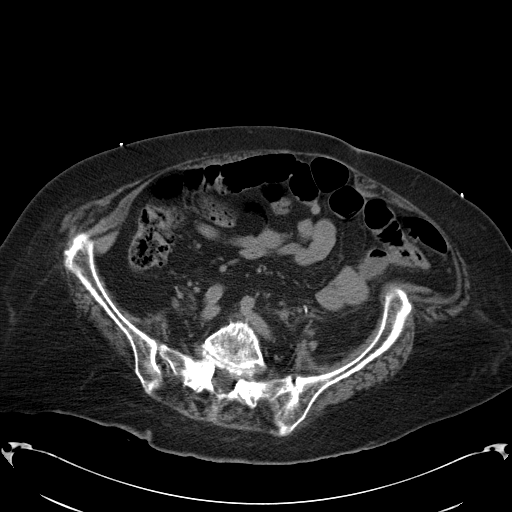
[im 45/94  soft-tissue]
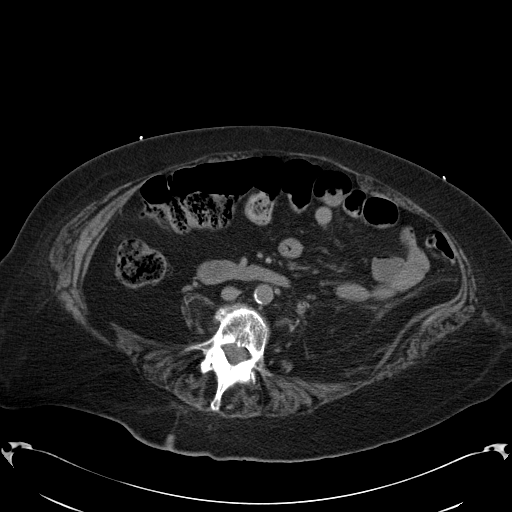
[im 49/94  soft-tissue]
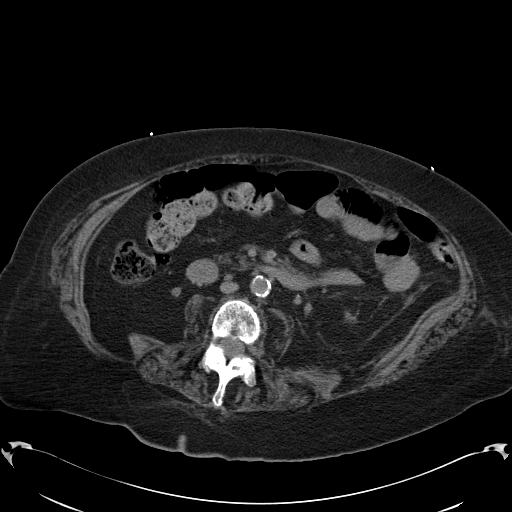
[im 56/94  soft-tissue]
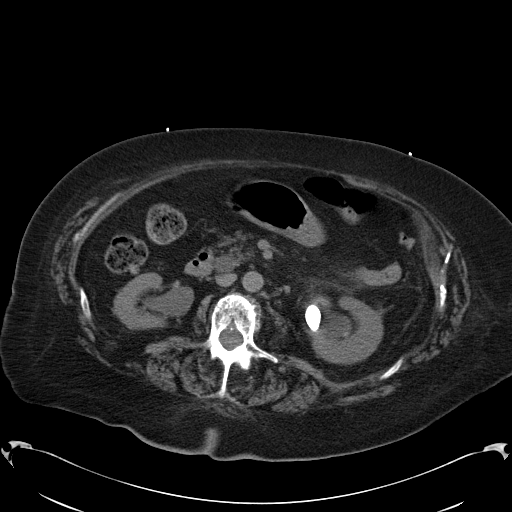
[im 56/94  bone]
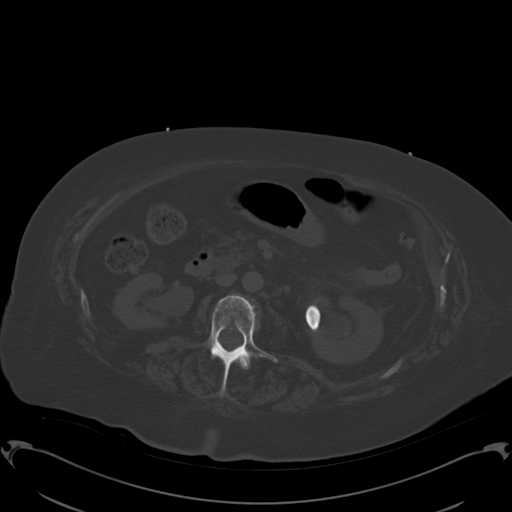
[im 64/94  soft-tissue]
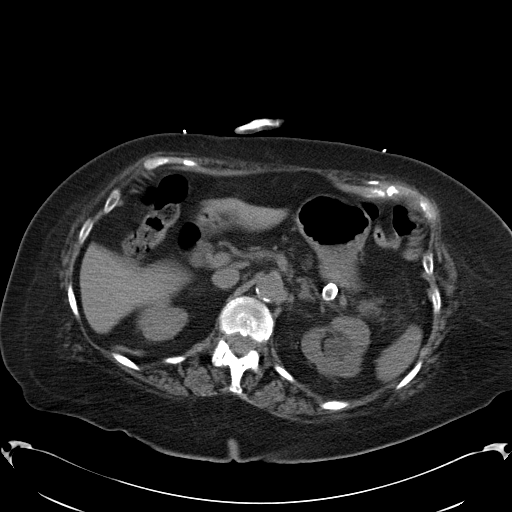
[im 71/94  soft-tissue]
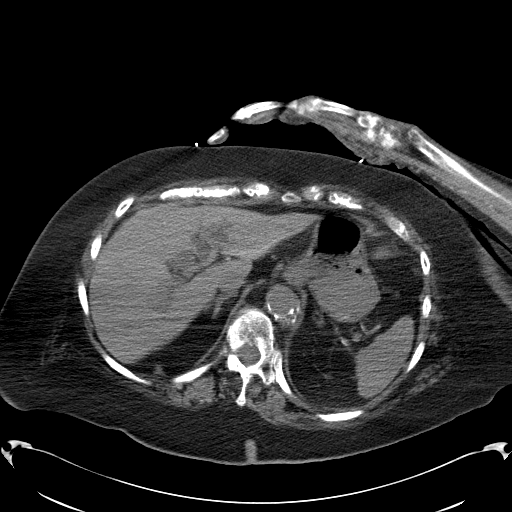
[im 75/94  soft-tissue]
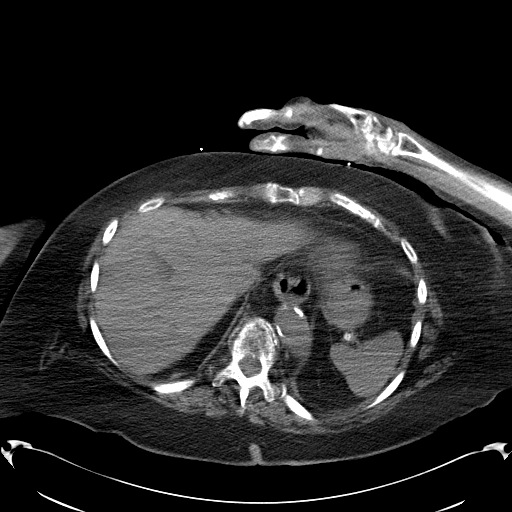
[im 82/94  soft-tissue]
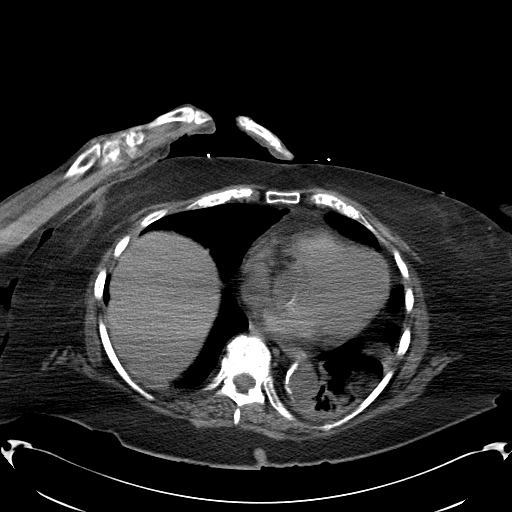
[im 90/94  soft-tissue]
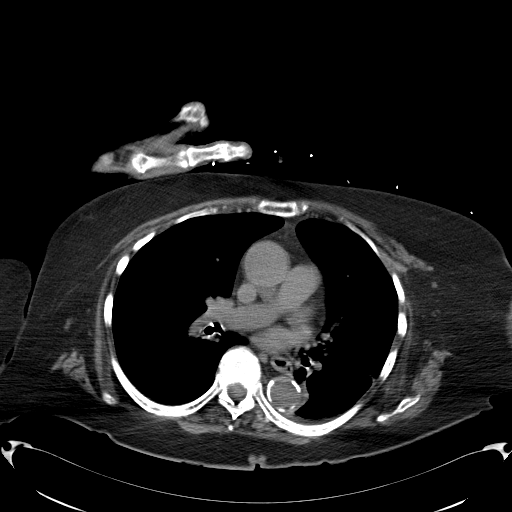

[Series 602: <mpr thick range> · coronal · 0.91mm/px · 3 of 78 slices shown]
[im 26/78  soft-tissue]
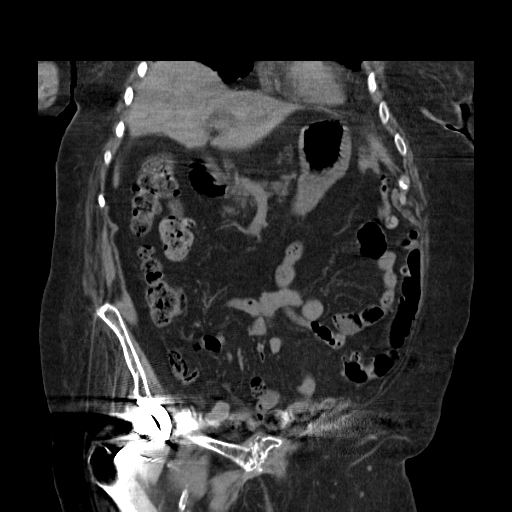
[im 35/78  soft-tissue]
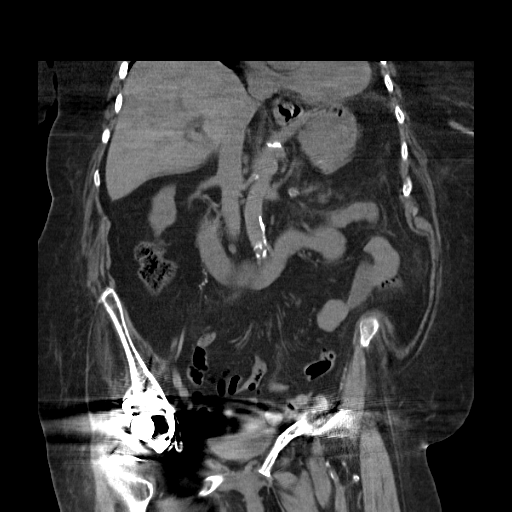
[im 43/78  soft-tissue]
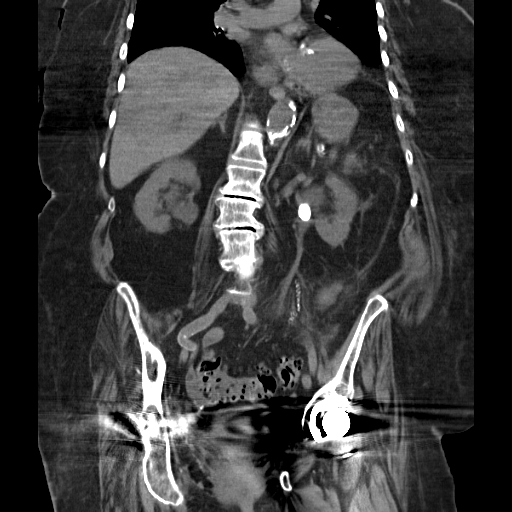

[17 of 46 positions shown; findings below may reference images not displayed]

FINDINGS: Lower chest: Streak artifact from patient's hands overlying the
upper abdomen noted. Trace left greater than right pleural fluid
with associated compressive atelectasis. Lung bases are otherwise
clear.

Hepatobiliary: Common duct fusiform dilation at its midportion
measuring 1.2 cm maximally, with tapering to the ampulla noted.
Liver is grossly unremarkable.

Pancreas: Fatty infiltrated and atrophic but grossly unremarkable
otherwise.

Spleen: Normal

Adrenals/Urinary Tract: Adrenal glands appear normal. Right sided
extrarenal pelvis noted but the right kidney is otherwise grossly
unremarkable. 2.0 cm left ureteropelvic junction calculus image 40,
with moderate left hydronephrosis. Left mid renal calculus measures
1.3 cm image 41. Trace left perinephric fluid/stranding.

Stomach/Bowel: Colonic diverticuli noted without evidence for
diverticulitis. No bowel wall thickening or focal segmental
dilatation is identified.

Vascular/Lymphatic: Moderate atheromatous aortic calcification
without aneurysm. Small retroperitoneal nodes are identified
measuring less than 1 cm in diameter.

Other: Ovaries are unremarkable. Detail in the anatomic pelvis is
obscured by streak artifact from bilateral hip arthroplasty.

No free air. Left greater than right fat containing inguinal
hernias.

Musculoskeletal: Multilevel severe degenerative change of the lumbar
spine. No acute osseous abnormality.
IMPRESSION: 2 cm left ureteropelvic junction calculus producing moderate left
hydronephrosis.

## 2017-03-08 IMAGING — XA IR NEPHROSTOMY PLACEMENT LEFT
1 series · 4 of 4 positions shown · non-contrast
Comparison: none

CLINICAL DATA: Left hydronephrosis, obstructing left UPJ calculus.

[Series 300: ir nephrostomy placement left · 4 of 4 slices shown]
[im 1/4]
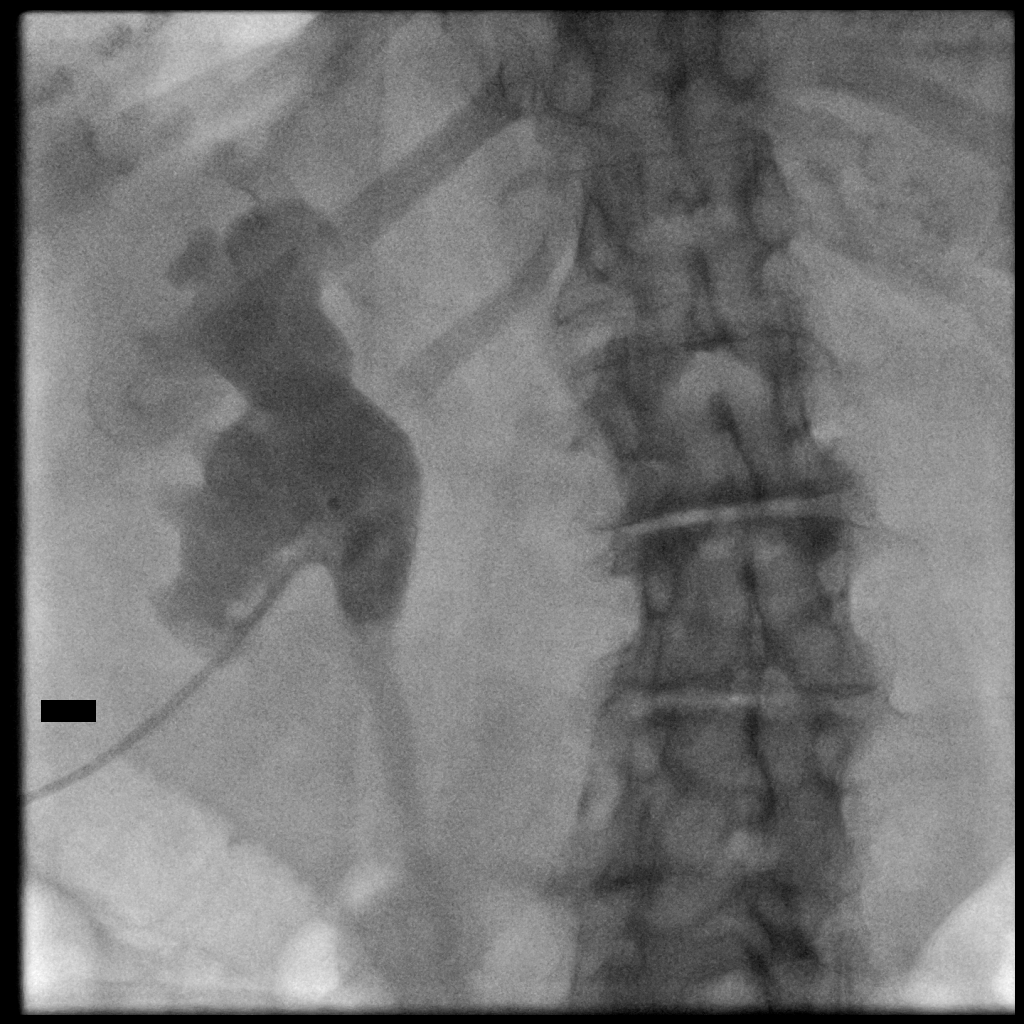
[im 2/4]
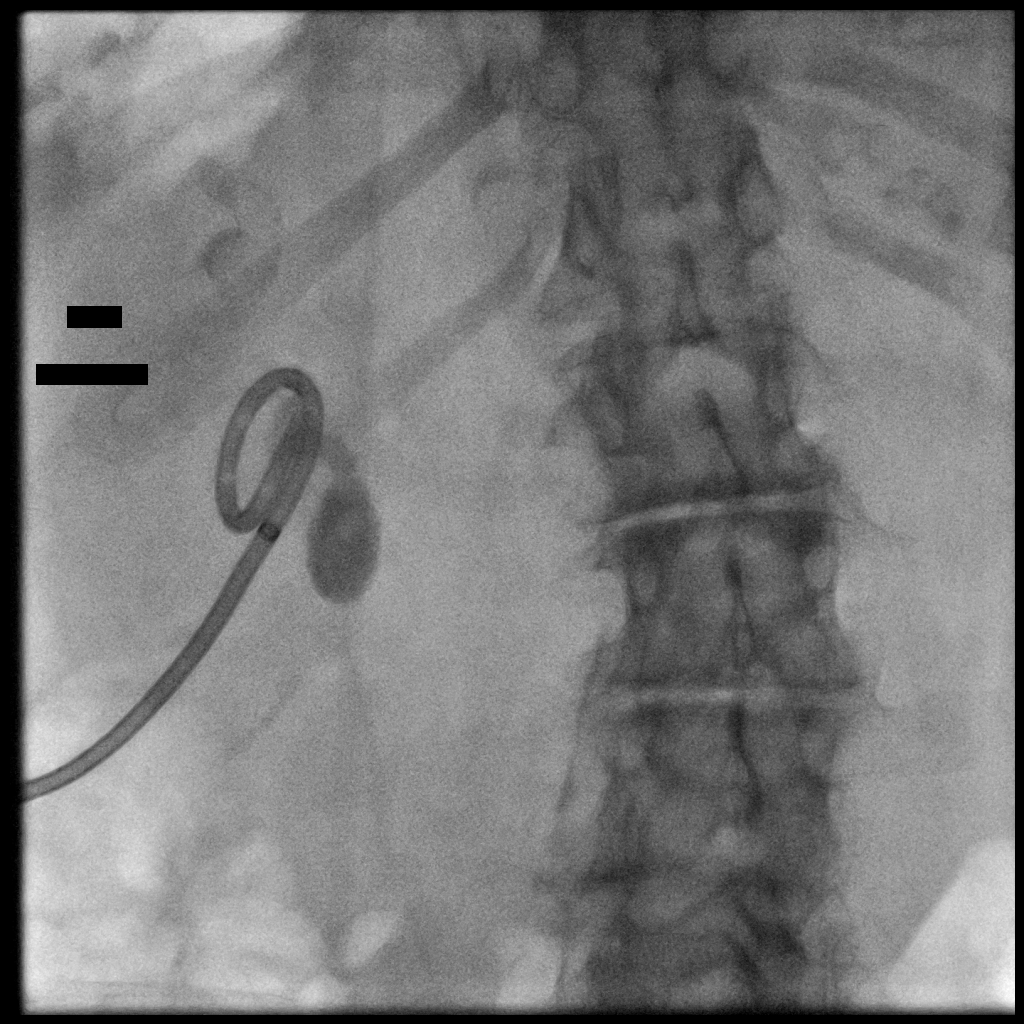
[im 3/4]
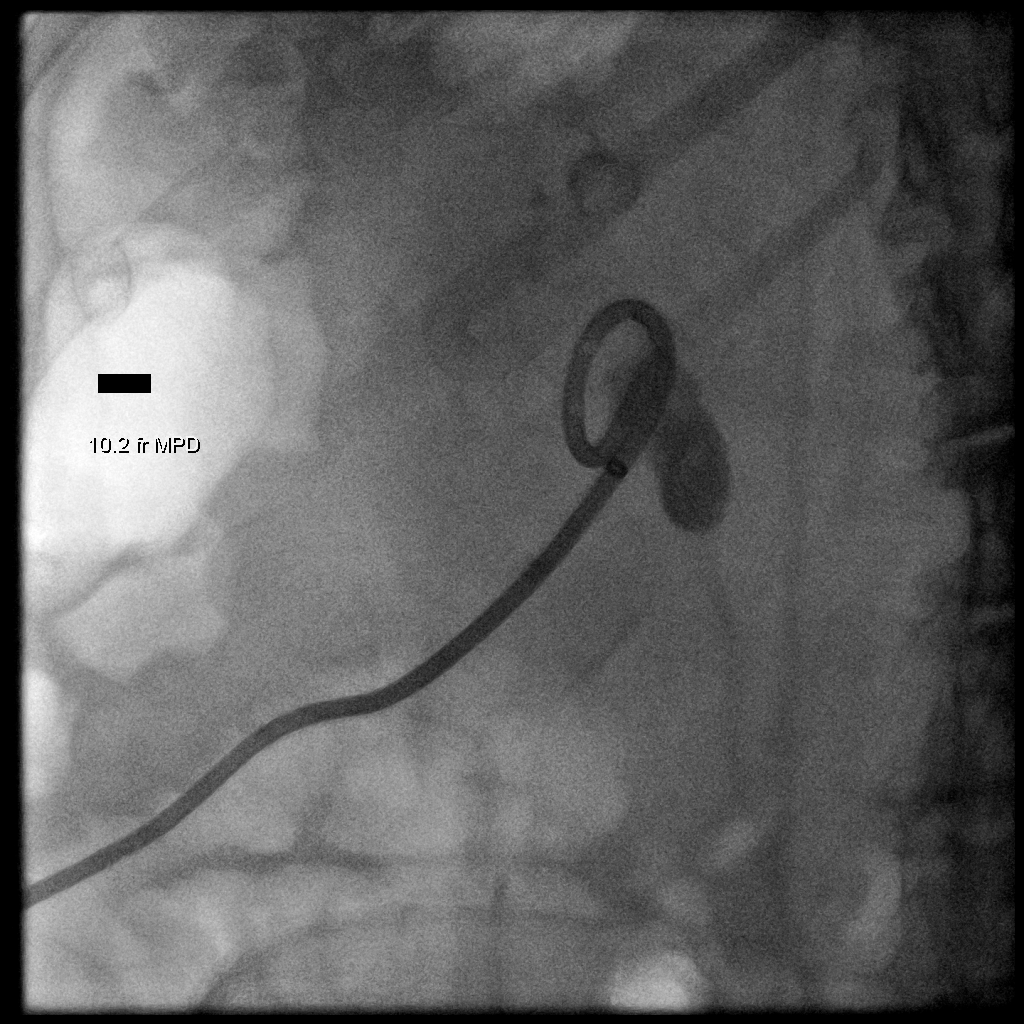
[im 4/4]
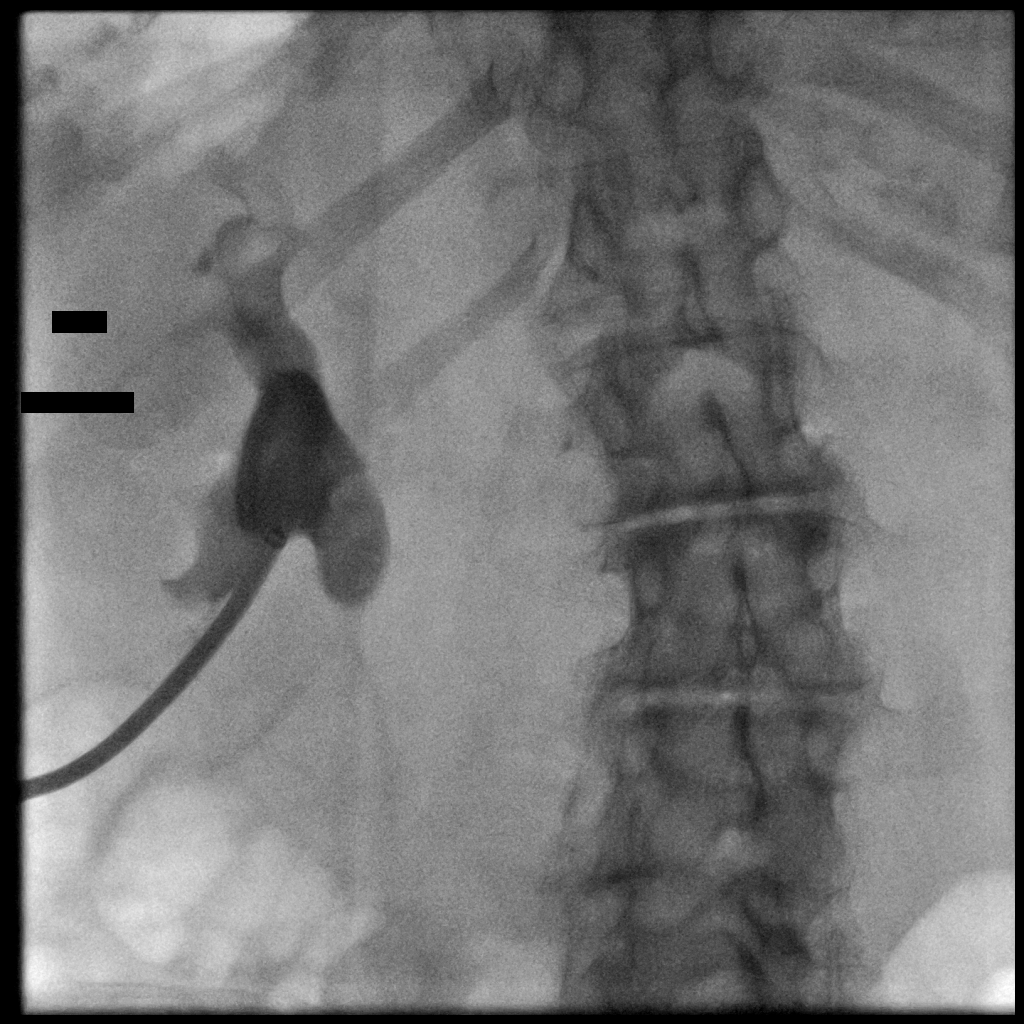

[4 of 4 positions shown; findings below may reference images not displayed]

EXAM:
ULTRASOUND GUIDANCE FOR NEPHROSTOMY ACCESS

TEN FRENCH LEFT NEPHROSTOMY UNDER FLUOROSCOPY

Radiologist:  Meji, D Leon

Guidance:  Ultrasound and fluoroscopic

FLUOROSCOPY TIME:  1 minutes 24 seconds, 19.9 mGy

MEDICATIONS AND MEDICAL HISTORY:
0.5 mg Versed, 25 mcg fentanyl, patient is already receiving IV
Rocephin.

ANESTHESIA/SEDATION:
8 minutes

CONTRAST:  5mL OMNIPAQUE IOHEXOL 300 MG/ML  SOLN

COMPLICATIONS:
None immediate

PROCEDURE:
Informed consent was obtained from the patient following explanation
of the procedure, risks, benefits and alternatives. The patient
understands, agrees and consents for the procedure. All questions
were addressed. A time out was performed.

Maximal barrier sterile technique utilized including caps, mask,
sterile gowns, sterile gloves, large sterile drape, hand hygiene,
and Betadine.

Previous imaging reviewed. Patient positioned prone. Preliminary
ultrasound demonstrated hydronephrosis of the left kidney. Under
sterile conditions and local anesthesia, a dilated lower pole calyx
was accessed percutaneously. This was performed with ultrasound.
Images obtained for documentation. There was return of cloudy urine.
Sample sent for Gram stain and culture. Guidewire advanced followed
by the Accustick dilator set. Amplatz guidewire exchange performed
followed by tract dilatation to insert a 10 French drain. Retention
loop formed in the collecting system. Contrast injection confirms
position. Catheter secured with a Prolene suture and connected to
external gravity drainage. No immediate complication. Patient
tolerated the procedure well.
IMPRESSION: Successful ultrasound and fluoroscopic left 10 French percutaneous
nephrostomy.
